# Patient Record
Sex: Male | Born: 1940 | Race: White | Hispanic: No | Marital: Married | Smoking: Never smoker
Health system: Southern US, Community
[De-identification: ages and names within clinical notes are randomized; demographics above are authoritative.]

## PROBLEM LIST (undated history)

## (undated) DIAGNOSIS — M7120 Synovial cyst of popliteal space [Baker], unspecified knee: Secondary | ICD-10-CM

## (undated) DIAGNOSIS — M1120 Other chondrocalcinosis, unspecified site: Secondary | ICD-10-CM

## (undated) DIAGNOSIS — H5702 Anisocoria: Secondary | ICD-10-CM

## (undated) DIAGNOSIS — H409 Unspecified glaucoma: Secondary | ICD-10-CM

## (undated) DIAGNOSIS — I82409 Acute embolism and thrombosis of unspecified deep veins of unspecified lower extremity: Secondary | ICD-10-CM

## (undated) DIAGNOSIS — K635 Polyp of colon: Secondary | ICD-10-CM

## (undated) DIAGNOSIS — E785 Hyperlipidemia, unspecified: Secondary | ICD-10-CM

## (undated) DIAGNOSIS — S0990XA Unspecified injury of head, initial encounter: Secondary | ICD-10-CM

## (undated) DIAGNOSIS — H4901 Third [oculomotor] nerve palsy, right eye: Secondary | ICD-10-CM

## (undated) DIAGNOSIS — F419 Anxiety disorder, unspecified: Secondary | ICD-10-CM

## (undated) DIAGNOSIS — K5790 Diverticulosis of intestine, part unspecified, without perforation or abscess without bleeding: Secondary | ICD-10-CM

## (undated) DIAGNOSIS — S0291XA Unspecified fracture of skull, initial encounter for closed fracture: Secondary | ICD-10-CM

## (undated) DIAGNOSIS — H269 Unspecified cataract: Secondary | ICD-10-CM

## (undated) DIAGNOSIS — I459 Conduction disorder, unspecified: Secondary | ICD-10-CM

## (undated) DIAGNOSIS — R911 Solitary pulmonary nodule: Secondary | ICD-10-CM

## (undated) DIAGNOSIS — I82819 Embolism and thrombosis of superficial veins of unspecified lower extremities: Secondary | ICD-10-CM

## (undated) DIAGNOSIS — E538 Deficiency of other specified B group vitamins: Secondary | ICD-10-CM

## (undated) DIAGNOSIS — N200 Calculus of kidney: Secondary | ICD-10-CM

## (undated) DIAGNOSIS — H02431 Paralytic ptosis of right eyelid: Secondary | ICD-10-CM

## (undated) DIAGNOSIS — C61 Malignant neoplasm of prostate: Secondary | ICD-10-CM

## (undated) DIAGNOSIS — M8580 Other specified disorders of bone density and structure, unspecified site: Secondary | ICD-10-CM

## (undated) DIAGNOSIS — M1710 Unilateral primary osteoarthritis, unspecified knee: Secondary | ICD-10-CM

## (undated) DIAGNOSIS — G40909 Epilepsy, unspecified, not intractable, without status epilepticus: Secondary | ICD-10-CM

## (undated) HISTORY — DX: Epilepsy, unspecified, not intractable, without status epilepticus: G40.909

## (undated) HISTORY — DX: Conduction disorder, unspecified: I45.9

## (undated) HISTORY — DX: Unilateral primary osteoarthritis, unspecified knee: M17.10

## (undated) HISTORY — DX: Third (oculomotor) nerve palsy, right eye: H49.01

## (undated) HISTORY — DX: Unspecified fracture of skull, initial encounter for closed fracture: S02.91XA

## (undated) HISTORY — DX: Polyp of colon: K63.5

## (undated) HISTORY — DX: Hyperlipidemia, unspecified: E78.5

## (undated) HISTORY — PX: COLONOSCOPY W/ POLYPECTOMY: SHX1380

## (undated) HISTORY — DX: Anxiety disorder, unspecified: F41.9

## (undated) HISTORY — DX: Acute embolism and thrombosis of unspecified deep veins of unspecified lower extremity: I82.409

## (undated) HISTORY — DX: Other chondrocalcinosis, unspecified site: M11.20

## (undated) HISTORY — DX: Malignant neoplasm of prostate: C61

## (undated) HISTORY — DX: Anisocoria: H57.02

## (undated) HISTORY — PX: LITHOTRIPSY: SUR834

## (undated) HISTORY — DX: Synovial cyst of popliteal space (Baker), unspecified knee: M71.20

## (undated) HISTORY — DX: Calculus of kidney: N20.0

## (undated) HISTORY — DX: Diverticulosis of intestine, part unspecified, without perforation or abscess without bleeding: K57.90

## (undated) HISTORY — DX: Unspecified cataract: H26.9

## (undated) HISTORY — DX: Solitary pulmonary nodule: R91.1

## (undated) HISTORY — DX: Embolism and thrombosis of superficial veins of unspecified lower extremity: I82.819

## (undated) HISTORY — DX: Paralytic ptosis of right eyelid: H02.431

## (undated) HISTORY — DX: Unspecified glaucoma: H40.9

## (undated) HISTORY — PX: CARPAL TUNNEL RELEASE: SHX101

## (undated) HISTORY — DX: Other specified disorders of bone density and structure, unspecified site: M85.80

## (undated) HISTORY — PX: INCISION AND DRAINAGE: SHX5863

---
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
## 2004-07-15 DIAGNOSIS — S0990XA Unspecified injury of head, initial encounter: Secondary | ICD-10-CM

## 2004-07-15 HISTORY — PX: SUBDURAL HEMATOMA EVACUATION VIA CRANIOTOMY: SUR319

## 2004-07-15 HISTORY — DX: Unspecified injury of head, initial encounter: S09.90XA

## 2006-07-15 HISTORY — PX: ROBOT ASSISTED LAPAROSCOPIC RADICAL PROSTATECTOMY: SHX5141

## 2007-07-16 DIAGNOSIS — M8580 Other specified disorders of bone density and structure, unspecified site: Secondary | ICD-10-CM

## 2007-07-16 HISTORY — DX: Other specified disorders of bone density and structure, unspecified site: M85.80

## 2007-09-02 HISTORY — PX: OTHER SURGICAL HISTORY: SHX169

## 2008-11-12 DIAGNOSIS — K5790 Diverticulosis of intestine, part unspecified, without perforation or abscess without bleeding: Secondary | ICD-10-CM

## 2008-11-12 DIAGNOSIS — K635 Polyp of colon: Secondary | ICD-10-CM

## 2008-11-12 HISTORY — DX: Diverticulosis of intestine, part unspecified, without perforation or abscess without bleeding: K57.90

## 2008-11-12 HISTORY — DX: Polyp of colon: K63.5

## 2011-05-24 DIAGNOSIS — Z9079 Acquired absence of other genital organ(s): Secondary | ICD-10-CM | POA: Insufficient documentation

## 2011-05-24 DIAGNOSIS — N200 Calculus of kidney: Secondary | ICD-10-CM | POA: Insufficient documentation

## 2012-07-15 DIAGNOSIS — M1710 Unilateral primary osteoarthritis, unspecified knee: Secondary | ICD-10-CM

## 2012-07-15 HISTORY — DX: Unilateral primary osteoarthritis, unspecified knee: M17.10

## 2013-07-15 DIAGNOSIS — I82819 Embolism and thrombosis of superficial veins of unspecified lower extremities: Secondary | ICD-10-CM

## 2013-07-15 HISTORY — DX: Embolism and thrombosis of superficial veins of unspecified lower extremity: I82.819

## 2013-09-17 DIAGNOSIS — H903 Sensorineural hearing loss, bilateral: Secondary | ICD-10-CM | POA: Diagnosis not present

## 2013-09-17 DIAGNOSIS — H905 Unspecified sensorineural hearing loss: Secondary | ICD-10-CM | POA: Diagnosis not present

## 2013-10-11 DIAGNOSIS — H02439 Paralytic ptosis unspecified eyelid: Secondary | ICD-10-CM | POA: Diagnosis not present

## 2013-10-11 DIAGNOSIS — H53469 Homonymous bilateral field defects, unspecified side: Secondary | ICD-10-CM | POA: Diagnosis not present

## 2013-10-11 DIAGNOSIS — H40019 Open angle with borderline findings, low risk, unspecified eye: Secondary | ICD-10-CM | POA: Diagnosis not present

## 2013-10-11 DIAGNOSIS — H526 Other disorders of refraction: Secondary | ICD-10-CM | POA: Diagnosis not present

## 2013-10-11 DIAGNOSIS — H4900 Third [oculomotor] nerve palsy, unspecified eye: Secondary | ICD-10-CM | POA: Diagnosis not present

## 2013-10-11 DIAGNOSIS — H2589 Other age-related cataract: Secondary | ICD-10-CM | POA: Diagnosis not present

## 2013-10-13 DIAGNOSIS — N200 Calculus of kidney: Secondary | ICD-10-CM | POA: Diagnosis not present

## 2013-10-13 DIAGNOSIS — Z79899 Other long term (current) drug therapy: Secondary | ICD-10-CM | POA: Diagnosis not present

## 2013-10-13 DIAGNOSIS — Z9889 Other specified postprocedural states: Secondary | ICD-10-CM | POA: Diagnosis not present

## 2013-10-13 DIAGNOSIS — G40909 Epilepsy, unspecified, not intractable, without status epilepticus: Secondary | ICD-10-CM | POA: Diagnosis not present

## 2013-10-13 DIAGNOSIS — Z125 Encounter for screening for malignant neoplasm of prostate: Secondary | ICD-10-CM | POA: Diagnosis not present

## 2013-10-13 DIAGNOSIS — M1990 Unspecified osteoarthritis, unspecified site: Secondary | ICD-10-CM | POA: Diagnosis not present

## 2013-11-16 DIAGNOSIS — D235 Other benign neoplasm of skin of trunk: Secondary | ICD-10-CM | POA: Diagnosis not present

## 2013-11-16 DIAGNOSIS — Z85828 Personal history of other malignant neoplasm of skin: Secondary | ICD-10-CM | POA: Diagnosis not present

## 2013-11-16 DIAGNOSIS — L570 Actinic keratosis: Secondary | ICD-10-CM | POA: Diagnosis not present

## 2013-11-16 DIAGNOSIS — L820 Inflamed seborrheic keratosis: Secondary | ICD-10-CM | POA: Diagnosis not present

## 2013-11-16 DIAGNOSIS — L723 Sebaceous cyst: Secondary | ICD-10-CM | POA: Diagnosis not present

## 2013-11-16 DIAGNOSIS — L819 Disorder of pigmentation, unspecified: Secondary | ICD-10-CM | POA: Diagnosis not present

## 2014-04-14 DIAGNOSIS — R7989 Other specified abnormal findings of blood chemistry: Secondary | ICD-10-CM | POA: Diagnosis not present

## 2014-04-14 DIAGNOSIS — N200 Calculus of kidney: Secondary | ICD-10-CM | POA: Diagnosis not present

## 2014-04-14 DIAGNOSIS — Z79899 Other long term (current) drug therapy: Secondary | ICD-10-CM | POA: Diagnosis not present

## 2014-04-14 DIAGNOSIS — I82812 Embolism and thrombosis of superficial veins of left lower extremities: Secondary | ICD-10-CM | POA: Diagnosis not present

## 2014-04-14 DIAGNOSIS — M7122 Synovial cyst of popliteal space [Baker], left knee: Secondary | ICD-10-CM | POA: Diagnosis not present

## 2014-04-14 DIAGNOSIS — M1990 Unspecified osteoarthritis, unspecified site: Secondary | ICD-10-CM | POA: Diagnosis not present

## 2014-04-14 DIAGNOSIS — K5790 Diverticulosis of intestine, part unspecified, without perforation or abscess without bleeding: Secondary | ICD-10-CM | POA: Diagnosis not present

## 2014-04-14 DIAGNOSIS — G40909 Epilepsy, unspecified, not intractable, without status epilepticus: Secondary | ICD-10-CM | POA: Diagnosis not present

## 2014-04-14 DIAGNOSIS — M7989 Other specified soft tissue disorders: Secondary | ICD-10-CM | POA: Diagnosis not present

## 2014-04-14 DIAGNOSIS — M25572 Pain in left ankle and joints of left foot: Secondary | ICD-10-CM | POA: Diagnosis not present

## 2014-04-14 DIAGNOSIS — Z9889 Other specified postprocedural states: Secondary | ICD-10-CM | POA: Diagnosis not present

## 2014-05-17 DIAGNOSIS — K5790 Diverticulosis of intestine, part unspecified, without perforation or abscess without bleeding: Secondary | ICD-10-CM | POA: Diagnosis not present

## 2014-05-17 DIAGNOSIS — G40909 Epilepsy, unspecified, not intractable, without status epilepticus: Secondary | ICD-10-CM | POA: Diagnosis not present

## 2014-05-17 DIAGNOSIS — N200 Calculus of kidney: Secondary | ICD-10-CM | POA: Diagnosis not present

## 2014-05-17 DIAGNOSIS — Z9889 Other specified postprocedural states: Secondary | ICD-10-CM | POA: Diagnosis not present

## 2014-05-17 DIAGNOSIS — I82402 Acute embolism and thrombosis of unspecified deep veins of left lower extremity: Secondary | ICD-10-CM | POA: Diagnosis not present

## 2014-05-17 DIAGNOSIS — M1990 Unspecified osteoarthritis, unspecified site: Secondary | ICD-10-CM | POA: Diagnosis not present

## 2014-05-31 DIAGNOSIS — L578 Other skin changes due to chronic exposure to nonionizing radiation: Secondary | ICD-10-CM | POA: Diagnosis not present

## 2014-05-31 DIAGNOSIS — L570 Actinic keratosis: Secondary | ICD-10-CM | POA: Diagnosis not present

## 2014-05-31 DIAGNOSIS — D225 Melanocytic nevi of trunk: Secondary | ICD-10-CM | POA: Diagnosis not present

## 2014-05-31 DIAGNOSIS — L821 Other seborrheic keratosis: Secondary | ICD-10-CM | POA: Diagnosis not present

## 2014-05-31 DIAGNOSIS — Z08 Encounter for follow-up examination after completed treatment for malignant neoplasm: Secondary | ICD-10-CM | POA: Diagnosis not present

## 2014-05-31 DIAGNOSIS — L820 Inflamed seborrheic keratosis: Secondary | ICD-10-CM | POA: Diagnosis not present

## 2014-07-20 DIAGNOSIS — I82402 Acute embolism and thrombosis of unspecified deep veins of left lower extremity: Secondary | ICD-10-CM | POA: Diagnosis not present

## 2014-07-20 DIAGNOSIS — G40909 Epilepsy, unspecified, not intractable, without status epilepticus: Secondary | ICD-10-CM | POA: Diagnosis not present

## 2014-07-20 DIAGNOSIS — I82812 Embolism and thrombosis of superficial veins of left lower extremities: Secondary | ICD-10-CM | POA: Diagnosis not present

## 2014-07-20 DIAGNOSIS — Z9889 Other specified postprocedural states: Secondary | ICD-10-CM | POA: Diagnosis not present

## 2014-07-20 DIAGNOSIS — M1990 Unspecified osteoarthritis, unspecified site: Secondary | ICD-10-CM | POA: Diagnosis not present

## 2014-07-20 DIAGNOSIS — K5790 Diverticulosis of intestine, part unspecified, without perforation or abscess without bleeding: Secondary | ICD-10-CM | POA: Diagnosis not present

## 2014-07-20 DIAGNOSIS — N200 Calculus of kidney: Secondary | ICD-10-CM | POA: Diagnosis not present

## 2014-10-14 DIAGNOSIS — R911 Solitary pulmonary nodule: Secondary | ICD-10-CM

## 2014-10-14 DIAGNOSIS — I459 Conduction disorder, unspecified: Secondary | ICD-10-CM

## 2014-10-14 HISTORY — DX: Solitary pulmonary nodule: R91.1

## 2014-10-14 HISTORY — DX: Conduction disorder, unspecified: I45.9

## 2014-10-19 DIAGNOSIS — Z79899 Other long term (current) drug therapy: Secondary | ICD-10-CM | POA: Diagnosis not present

## 2014-10-19 DIAGNOSIS — M1990 Unspecified osteoarthritis, unspecified site: Secondary | ICD-10-CM | POA: Insufficient documentation

## 2014-10-19 DIAGNOSIS — Z125 Encounter for screening for malignant neoplasm of prostate: Secondary | ICD-10-CM | POA: Diagnosis not present

## 2014-10-19 DIAGNOSIS — K5790 Diverticulosis of intestine, part unspecified, without perforation or abscess without bleeding: Secondary | ICD-10-CM | POA: Diagnosis not present

## 2014-10-19 DIAGNOSIS — Z0000 Encounter for general adult medical examination without abnormal findings: Secondary | ICD-10-CM | POA: Diagnosis not present

## 2014-10-19 DIAGNOSIS — G40909 Epilepsy, unspecified, not intractable, without status epilepticus: Secondary | ICD-10-CM | POA: Diagnosis not present

## 2014-10-19 DIAGNOSIS — Z9889 Other specified postprocedural states: Secondary | ICD-10-CM | POA: Diagnosis not present

## 2014-10-19 DIAGNOSIS — M159 Polyosteoarthritis, unspecified: Secondary | ICD-10-CM | POA: Diagnosis not present

## 2014-10-19 DIAGNOSIS — M1000 Idiopathic gout, unspecified site: Secondary | ICD-10-CM | POA: Diagnosis not present

## 2014-10-19 DIAGNOSIS — N200 Calculus of kidney: Secondary | ICD-10-CM | POA: Diagnosis not present

## 2014-11-09 DIAGNOSIS — H2510 Age-related nuclear cataract, unspecified eye: Secondary | ICD-10-CM | POA: Diagnosis not present

## 2014-11-09 DIAGNOSIS — H40019 Open angle with borderline findings, low risk, unspecified eye: Secondary | ICD-10-CM | POA: Diagnosis not present

## 2014-11-09 DIAGNOSIS — H524 Presbyopia: Secondary | ICD-10-CM | POA: Diagnosis not present

## 2014-11-29 DIAGNOSIS — Z85828 Personal history of other malignant neoplasm of skin: Secondary | ICD-10-CM | POA: Diagnosis not present

## 2014-11-29 DIAGNOSIS — C44612 Basal cell carcinoma of skin of right upper limb, including shoulder: Secondary | ICD-10-CM | POA: Diagnosis not present

## 2014-11-29 DIAGNOSIS — L570 Actinic keratosis: Secondary | ICD-10-CM | POA: Diagnosis not present

## 2014-11-29 DIAGNOSIS — L821 Other seborrheic keratosis: Secondary | ICD-10-CM | POA: Diagnosis not present

## 2014-11-29 DIAGNOSIS — C44519 Basal cell carcinoma of skin of other part of trunk: Secondary | ICD-10-CM | POA: Diagnosis not present

## 2015-02-18 DIAGNOSIS — Z79899 Other long term (current) drug therapy: Secondary | ICD-10-CM | POA: Diagnosis not present

## 2015-02-18 DIAGNOSIS — Z91013 Allergy to seafood: Secondary | ICD-10-CM | POA: Diagnosis not present

## 2015-02-18 DIAGNOSIS — Z7982 Long term (current) use of aspirin: Secondary | ICD-10-CM | POA: Diagnosis not present

## 2015-02-18 DIAGNOSIS — M79662 Pain in left lower leg: Secondary | ICD-10-CM | POA: Diagnosis not present

## 2015-02-18 DIAGNOSIS — M79605 Pain in left leg: Secondary | ICD-10-CM | POA: Diagnosis not present

## 2015-02-18 DIAGNOSIS — I82432 Acute embolism and thrombosis of left popliteal vein: Secondary | ICD-10-CM | POA: Diagnosis not present

## 2015-02-18 DIAGNOSIS — Z87820 Personal history of traumatic brain injury: Secondary | ICD-10-CM | POA: Diagnosis not present

## 2015-02-18 DIAGNOSIS — Z880 Allergy status to penicillin: Secondary | ICD-10-CM | POA: Diagnosis not present

## 2015-02-18 DIAGNOSIS — M7989 Other specified soft tissue disorders: Secondary | ICD-10-CM | POA: Diagnosis not present

## 2015-02-21 DIAGNOSIS — C44519 Basal cell carcinoma of skin of other part of trunk: Secondary | ICD-10-CM | POA: Diagnosis not present

## 2015-02-21 DIAGNOSIS — L821 Other seborrheic keratosis: Secondary | ICD-10-CM | POA: Diagnosis not present

## 2015-02-21 DIAGNOSIS — Z08 Encounter for follow-up examination after completed treatment for malignant neoplasm: Secondary | ICD-10-CM | POA: Diagnosis not present

## 2015-02-21 DIAGNOSIS — L570 Actinic keratosis: Secondary | ICD-10-CM | POA: Diagnosis not present

## 2015-02-21 DIAGNOSIS — L218 Other seborrheic dermatitis: Secondary | ICD-10-CM | POA: Diagnosis not present

## 2015-06-19 ENCOUNTER — Encounter: Payer: Self-pay | Admitting: Family Medicine

## 2015-06-19 DIAGNOSIS — M8580 Other specified disorders of bone density and structure, unspecified site: Secondary | ICD-10-CM | POA: Insufficient documentation

## 2015-06-19 DIAGNOSIS — H409 Unspecified glaucoma: Secondary | ICD-10-CM | POA: Insufficient documentation

## 2015-06-19 DIAGNOSIS — F09 Unspecified mental disorder due to known physiological condition: Secondary | ICD-10-CM | POA: Insufficient documentation

## 2015-06-19 DIAGNOSIS — G40909 Epilepsy, unspecified, not intractable, without status epilepticus: Secondary | ICD-10-CM | POA: Insufficient documentation

## 2015-06-22 ENCOUNTER — Ambulatory Visit (INDEPENDENT_AMBULATORY_CARE_PROVIDER_SITE_OTHER): Payer: Medicare Other | Admitting: Family Medicine

## 2015-06-22 ENCOUNTER — Encounter: Payer: Self-pay | Admitting: Family Medicine

## 2015-06-22 VITALS — BP 146/89 | HR 64 | Temp 98.2°F | Resp 16 | Ht 71.0 in | Wt 201.8 lb

## 2015-06-22 DIAGNOSIS — Z86010 Personal history of colonic polyps: Secondary | ICD-10-CM | POA: Diagnosis not present

## 2015-06-22 DIAGNOSIS — Z1283 Encounter for screening for malignant neoplasm of skin: Secondary | ICD-10-CM | POA: Diagnosis not present

## 2015-06-22 DIAGNOSIS — Z23 Encounter for immunization: Secondary | ICD-10-CM

## 2015-06-22 DIAGNOSIS — G40909 Epilepsy, unspecified, not intractable, without status epilepticus: Secondary | ICD-10-CM | POA: Diagnosis not present

## 2015-06-22 NOTE — Addendum Note (Signed)
Addended by: Lanae Crumbly on: 06/22/2015 02:04 PM   Modules accepted: Orders

## 2015-06-22 NOTE — Progress Notes (Signed)
Office Note 06/22/2015  CC:  Chief Complaint  Patient presents with  . Establish Care    HPI:  Adam Melton is a 74 y.o. White male who is here to establish care. Patient's most recent primary MD: Dr. Annamaria Melton in [place]. Old records were reviewed prior to or during today's visit.  No acute complaints.  His most recent CPE with labs was 10/19/2014--CBC, CMET, TSH all normal.  Hemoccult negative.  UA normal.  PSA < 0.1.  Uric acid 6.4 (WNL).  Dilantin level 12.  No seizure activity in a long time (approx 5 yrs).  Not feeling any sign of dilantin toxicity.  He asks for dilantin level check today. Hx of superficial VT L calf, occ notes a bit of odd pressure sensation focally in back of L calf.  No pain or swelling or redness.  He does have some varicose veins on L>R.  Past Medical History  Diagnosis Date  . Osteopenia 2009    By DEXA  . 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  F/u ultrasound was NORMAL.  . Lung nodule 10/2014    calcified granulomata  . Tricompartmental disease of knee 2014    right knee  . Colon polyps 11/2008  . Diverticulosis 11/2008  . Skull fracture ([name])     > 30 years ago; now with seizure d/o  . 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 Nephrolithiasis   . Prostate cancer ([place])     robotic prostectomy   . Anisocoria     R pupil larger than L  . Cataract   . 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 History of prostate cancer   . 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 Robot assisted laparoscopic radical prostatectomy  2008  . Lithotripsy    . Colonoscopy w/ polypectomy  12/02/08    Recall 3 yrs per old records  . Dexa  09/02/07    Osteopenia  . Carpal tunnel release Right     Family History   Problem Relation Age of Onset  . Stroke Mother   . Alcohol abuse Father   . Diabetes Neg Hx   . Heart disease Neg Hx   . Cancer Neg Hx     Social History   Social History  . Marital Status: Married    Spouse Name: Adam Melton  . Number of Children: Adam Melton  . Years of Education: Adam Melton   Occupational History  . Not on file.   Social History Main Topics  . Smoking status: Never Smoker   . Smokeless tobacco: Never Used  . Alcohol Use: No  . Drug Use: No  . Sexual Activity: Not on file   Other Topics Concern  . Not on file   Social History Narrative   Married, has one daughter and 3 grandchildren.   Relocated from [place], [place] 05/2015.   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    Outpatient Encounter Prescriptions as of 06/22/2015  Medication Sig  . aspirin 81 MG EC tablet Take 81 mg by mouth daily.  . busPIRone (BUSPAR) 5 MG tablet Take 5 mg by mouth daily.  . phenytoin (DILANTIN) 100 MG ER capsule Take 200 mg  by mouth 2 (two) times daily.  . travoprost, benzalkonium, (TRAVATAN) 0.004 % ophthalmic solution Place 1 drop into both eyes at bedtime.   No facility-administered encounter medications on file as of 06/22/2015.   Allergies  Allergen Reactions  . Penicillins Nausea And Vomiting and Rash  . Shellfish Allergy Swelling   ROS Review of Systems  Constitutional: Negative for fever and fatigue.  HENT: Negative for congestion and sore throat.   Eyes: Negative for visual disturbance.  Respiratory: Negative for cough.   Cardiovascular: Negative for chest pain.  Gastrointestinal: Negative for nausea and abdominal pain.  Genitourinary: Negative for dysuria.  Musculoskeletal: Negative for back pain and joint swelling.  Skin: Negative for rash.  Neurological: Negative for weakness and headaches.  Hematological: Negative for adenopathy.    PE; Blood pressure 146/89, pulse 64, temperature 98.2 F (36.8 C), temperature source Oral, resp. rate 16, height 5\' 11"  (1.803 m),  weight 201 lb 12 oz (91.513 kg), SpO2 96 %. Gen: Alert, well appearing.  Patient is oriented to person, place, time, and situation. ENT: R ptosis, R pupil large and nonreactive to light and R eye does not move upward (but all other EOM of R eye intact).   Oropharynx: pink/moist mucosa, no lesion or swelling. Tongue midline CV: RRR, no m/r/g.   LUNGS: CTA bilat, nonlabored resps, good aeration in all lung fields. EXT: no c/c/e.  No tenderness of L calf, no palpable knot or cord in calf, no erythema or warmth.  No LL asymmetry.  No popliteal region tenderness.  Homan's neg.  Pertinent labs:  None today  ASSESSMENT AND PLAN:   New pt; reviewed old records.  1) Seizure d/o: will obtain dilantin level today. Last dilantin level was done 10/19/14 and it was 12.0. He has been seizure-free on current dosing for about 5 yrs.  2) Hx of adenomatous colon polyps: he is behind on surveillance colonoscopy and I have ordered referral to local GI for colonoscopy today.  3) Skin cancer screening exam: pt requests referral to dermatologist so he can get full skin screening exam that he says he gets annually.  4) Hx of L calf SVT 2015.  Mild/vague discomfort focally in L calf on intermittent basis since that time.  No sign of SVT or DVT at this time.  Reassured pt. Signs/symptoms to call or return for were reviewed and pt expressed understanding.  An After Visit Summary was printed and given to the patient.  Return in about 5 months (around 11/20/2015) for annual CPE (fasting).

## 2015-06-22 NOTE — Progress Notes (Signed)
Pre visit review using our clinic review tool, if applicable. No additional management support is needed unless otherwise documented below in the visit note. 

## 2015-06-23 LAB — PHENYTOIN LEVEL, TOTAL: Phenytoin Lvl: 12.4 ug/mL (ref 10.0–20.0)

## 2015-06-26 ENCOUNTER — Telehealth: Payer: Self-pay | Admitting: Internal Medicine

## 2015-06-26 ENCOUNTER — Encounter: Payer: Self-pay | Admitting: Family Medicine

## 2015-06-26 NOTE — Telephone Encounter (Signed)
Received GI records and placed on Dr. Blanch Media desk for review. Dr. Henrene Pastor is Doc of the Day.

## 2015-07-06 NOTE — Telephone Encounter (Signed)
Dr. Henrene Pastor reviewed records and has accepted patient. OK to schedule Direct Colonoscopy. Left message for patient to return my call.

## 2015-08-02 ENCOUNTER — Encounter: Payer: Self-pay | Admitting: Internal Medicine

## 2015-08-02 ENCOUNTER — Encounter: Payer: Self-pay | Admitting: Family Medicine

## 2015-08-02 NOTE — Telephone Encounter (Signed)
Colonoscopy scheduled.

## 2015-08-23 DIAGNOSIS — L723 Sebaceous cyst: Secondary | ICD-10-CM | POA: Diagnosis not present

## 2015-08-23 DIAGNOSIS — L919 Hypertrophic disorder of the skin, unspecified: Secondary | ICD-10-CM | POA: Diagnosis not present

## 2015-08-23 DIAGNOSIS — L812 Freckles: Secondary | ICD-10-CM | POA: Diagnosis not present

## 2015-08-23 DIAGNOSIS — L821 Other seborrheic keratosis: Secondary | ICD-10-CM | POA: Diagnosis not present

## 2015-08-23 DIAGNOSIS — L570 Actinic keratosis: Secondary | ICD-10-CM | POA: Diagnosis not present

## 2015-08-23 DIAGNOSIS — Z85828 Personal history of other malignant neoplasm of skin: Secondary | ICD-10-CM | POA: Diagnosis not present

## 2015-09-04 DIAGNOSIS — H4903 Third [oculomotor] nerve palsy, bilateral: Secondary | ICD-10-CM | POA: Diagnosis not present

## 2015-09-04 DIAGNOSIS — H02431 Paralytic ptosis of right eyelid: Secondary | ICD-10-CM | POA: Diagnosis not present

## 2015-09-04 DIAGNOSIS — H25813 Combined forms of age-related cataract, bilateral: Secondary | ICD-10-CM | POA: Diagnosis not present

## 2015-09-04 DIAGNOSIS — H5053 Vertical heterophoria: Secondary | ICD-10-CM | POA: Diagnosis not present

## 2015-09-04 DIAGNOSIS — H401131 Primary open-angle glaucoma, bilateral, mild stage: Secondary | ICD-10-CM | POA: Diagnosis not present

## 2015-09-04 DIAGNOSIS — H53462 Homonymous bilateral field defects, left side: Secondary | ICD-10-CM | POA: Diagnosis not present

## 2015-09-04 DIAGNOSIS — H527 Unspecified disorder of refraction: Secondary | ICD-10-CM | POA: Diagnosis not present

## 2015-09-06 ENCOUNTER — Encounter: Payer: Medicare Other | Admitting: Internal Medicine

## 2015-09-28 ENCOUNTER — Ambulatory Visit (AMBULATORY_SURGERY_CENTER): Payer: Self-pay | Admitting: *Deleted

## 2015-09-28 VITALS — Ht 73.0 in | Wt 206.0 lb

## 2015-09-28 DIAGNOSIS — Z86010 Personal history of colonic polyps: Secondary | ICD-10-CM

## 2015-09-28 NOTE — Progress Notes (Signed)
No egg or soy allergy. No anesthesia problems.  No home O2.  No diet meds.  No emmi given.  

## 2015-10-12 ENCOUNTER — Encounter: Payer: Self-pay | Admitting: Internal Medicine

## 2015-10-12 ENCOUNTER — Ambulatory Visit (AMBULATORY_SURGERY_CENTER): Payer: Medicare Other | Admitting: Internal Medicine

## 2015-10-12 VITALS — BP 123/80 | HR 58 | Temp 97.3°F | Resp 16 | Ht 73.0 in | Wt 206.0 lb

## 2015-10-12 DIAGNOSIS — D123 Benign neoplasm of transverse colon: Secondary | ICD-10-CM

## 2015-10-12 DIAGNOSIS — K5730 Diverticulosis of large intestine without perforation or abscess without bleeding: Secondary | ICD-10-CM | POA: Diagnosis not present

## 2015-10-12 DIAGNOSIS — Z86010 Personal history of colonic polyps: Secondary | ICD-10-CM

## 2015-10-12 DIAGNOSIS — K635 Polyp of colon: Secondary | ICD-10-CM | POA: Diagnosis not present

## 2015-10-12 DIAGNOSIS — D125 Benign neoplasm of sigmoid colon: Secondary | ICD-10-CM | POA: Diagnosis not present

## 2015-10-12 DIAGNOSIS — D120 Benign neoplasm of cecum: Secondary | ICD-10-CM

## 2015-10-12 DIAGNOSIS — G40909 Epilepsy, unspecified, not intractable, without status epilepticus: Secondary | ICD-10-CM | POA: Diagnosis not present

## 2015-10-12 MED ORDER — SODIUM CHLORIDE 0.9 % IV SOLN: 500.0000 mL | INTRAVENOUS | Status: DC

## 2015-10-12 NOTE — Patient Instructions (Signed)
YOU HAD AN ENDOSCOPIC PROCEDURE TODAY AT [facility]:   Refer to the procedure report that was given to you for any specific questions about what was found during the examination.  If the procedure report does not answer your questions, please call your gastroenterologist to clarify.  If you requested that your care partner not be given the details of your procedure findings, then the procedure report has been included in a sealed envelope for you to review at your convenience later.  YOU SHOULD EXPECT: Some feelings of bloating in the abdomen. Passage of more gas than usual.  Walking can help get rid of the air that was put into your GI tract during the procedure and reduce the bloating. If you had a lower endoscopy (such as a colonoscopy or flexible sigmoidoscopy) you may notice spotting of blood in your stool or on the toilet paper. If you underwent a bowel prep for your procedure, you may not have a normal bowel movement for a few days.  Please Note:  You might notice some irritation and congestion in your nose or some drainage.  This is from the oxygen used during your procedure.  There is no need for concern and it should clear up in a day or so.  SYMPTOMS TO REPORT IMMEDIATELY:   Following lower endoscopy (colonoscopy or flexible sigmoidoscopy):  Excessive amounts of blood in the stool  Significant tenderness or worsening of abdominal pains  Swelling of the abdomen that is new, acute  Fever of 100F or higher   For urgent or emergent issues, a gastroenterologist can be reached at any hour by calling [phone].   DIET: Your first meal following the procedure should be a small meal and then it is ok to progress to your normal diet. Heavy or fried foods are harder to digest and may make you feel nauseous or bloated.  Likewise, meals heavy in dairy and vegetables can increase bloating.  Drink plenty of fluids but you should avoid alcoholic beverages for 24  hours.  ACTIVITY:  You should plan to take it easy for the rest of today and you should NOT DRIVE or use heavy machinery until tomorrow (because of the sedation medicines used during the test).    FOLLOW UP: Our staff will call the number listed on your records the next business day following your procedure to check on you and address any questions or concerns that you may have regarding the information given to you following your procedure. If we do not reach you, we will leave a message.  However, if you are feeling well and you are not experiencing any problems, there is no need to return our call.  We will assume that you have returned to your regular daily activities without incident.  If any biopsies were taken you will be contacted by phone or by letter within the next 1-3 weeks.  Please call us at [phone] if you have not heard about the biopsies in 3 weeks.    SIGNATURES/CONFIDENTIALITY: You and/or your care partner have signed paperwork which will be entered into your electronic medical record.  These signatures attest to the fact that that the information above on your After Visit Summary has been reviewed and is understood.  Full responsibility of the confidentiality of this discharge information lies with you and/or your care-partner.  Polyp handout given Await pathology results Resume medication and diet

## 2015-10-12 NOTE — Progress Notes (Signed)
Patient denies any allergies to eggs or soy. 

## 2015-10-12 NOTE — Progress Notes (Signed)
Report given to RN, vss

## 2015-10-12 NOTE — Progress Notes (Signed)
Called to room to assist during endoscopic procedure.  Patient ID and intended procedure confirmed with present staff. Received instructions for my participation in the procedure from the performing physician.  

## 2015-10-12 NOTE — Op Note (Signed)
[facility] Patient Name: Adam Melton Procedure Date: 10/12/2015 9:37 AM MRN: [id number] Endoscopist: Docia Chuck. Henrene Pastor , MD Age: 75 Referring MD:  Date of Birth: 01-24-1941 Gender: Male Procedure:                Colonoscopy with snare polypectomy x 6 Indications:              High risk colon cancer surveillance: Personal                            history of multiple (3 or more) adenomas (Elsewhere                            2010) Medicines:                Monitored Anesthesia Care Procedure:                Pre-Anesthesia Assessment:                           - Prior to the procedure, a History and Physical                            was performed, and patient medications and                            allergies were reviewed. The patient's tolerance of                            previous anesthesia was also reviewed. The risks                            and benefits of the procedure and the sedation                            options and risks were discussed with the patient.                            All questions were answered, and informed consent                            was obtained. Prior Anticoagulants: The patient has                            taken no previous anticoagulant or antiplatelet                            agents. ASA Grade Assessment: II - A patient with                            mild systemic disease. After reviewing the risks                            and benefits, the patient was deemed in  satisfactory condition to undergo the procedure.                           After obtaining informed consent, the colonoscope                            was passed under direct vision. Throughout the                            procedure, the patient's blood pressure, pulse, and                            oxygen saturations were monitored continuously. The                            Model CF-HQ190L [id number]) scope was introduced                           through the anus and advanced to the the cecum,                            identified by appendiceal orifice and ileocecal                            valve. The colonoscopy was performed without                            difficulty. The patient tolerated the procedure                            well. The quality of the bowel preparation was                            excellent. The bowel preparation used was SUPREP.                            The ileocecal valve, appendiceal orifice, and                            rectum were photographed. Scope In: 9:49:34 AM Scope Out: 10:12:02 AM Scope Withdrawal Time: 0 hours 17 minutes 13 seconds  Total Procedure Duration: 0 hours 22 minutes 28 seconds  Findings:      The digital rectal exam was normal.      Six polyps were found in the sigmoid colon, transverse colon and cecum.       The polyps were 4 to 9 mm in size. These polyps were removed with a cold       snare. Resection and retrieval were complete.      Multiple diverticula were found in the sigmoid colon.      The exam was otherwise without abnormality on direct and retroflexion       views. Complications:            No immediate complications. Estimated Blood Loss:     Estimated blood loss: none. Impression:               -  Six 4 to 9 mm polyps in the sigmoid colon, in the                            transverse colon and in the cecum, removed with a                            cold snare. Resected and retrieved.                           - Diverticulosis in the sigmoid colon.                           - The examination was otherwise normal on direct                            and retroflexion views. Recommendation:           - Patient has a contact number available for                            emergencies. The signs and symptoms of potential                            delayed complications were discussed with the                            patient. Return to  normal activities tomorrow.                            Written discharge instructions were provided to the                            patient.                           - Resume previous diet.                           - Continue present medications.                           - Await pathology results.                           - Repeat colonoscopy in 3 years for surveillance. Procedure Code(s):        --- Professional ---                           [phone], Colonoscopy, flexible; with removal of                            tumor(s), polyp(s), or other lesion(s) by snare                            technique CPT copyright 2016 American Medical Association. All rights reserved. Docia Chuck. Henrene Pastor,  MD 10/12/2015 10:22:10 AM This report has been signed electronically. Number of Addenda: 0 Referring MD:      Tammi Sou

## 2015-10-13 ENCOUNTER — Telehealth: Payer: Self-pay | Admitting: *Deleted

## 2015-10-13 NOTE — Telephone Encounter (Signed)
  Follow up Call-  Call back number 10/12/2015  Post procedure Call Back phone  # [phone]  Permission to leave phone message Yes     Patient questions:  Do you have a fever, pain , or abdominal swelling? No. Pain Score  0 *  Have you tolerated food without any problems? Yes.    Have you been able to return to your normal activities? Yes.    Do you have any questions about your discharge instructions: Diet   No. Medications  No. Follow up visit  No.  Do you have questions or concerns about your Care? No.  Actions: * If pain score is 4 or above: No action needed, pain <4.

## 2015-10-17 ENCOUNTER — Encounter: Payer: Self-pay | Admitting: Internal Medicine

## 2015-11-13 DIAGNOSIS — E785 Hyperlipidemia, unspecified: Secondary | ICD-10-CM

## 2015-11-13 HISTORY — DX: Hyperlipidemia, unspecified: E78.5

## 2015-11-16 ENCOUNTER — Encounter: Payer: Self-pay | Admitting: Family Medicine

## 2015-11-16 ENCOUNTER — Ambulatory Visit (INDEPENDENT_AMBULATORY_CARE_PROVIDER_SITE_OTHER): Payer: Medicare Other | Admitting: Family Medicine

## 2015-11-16 VITALS — BP 134/88 | HR 52 | Temp 98.1°F | Resp 16 | Ht 72.0 in | Wt 205.8 lb

## 2015-11-16 DIAGNOSIS — Z0000 Encounter for general adult medical examination without abnormal findings: Secondary | ICD-10-CM | POA: Diagnosis not present

## 2015-11-16 DIAGNOSIS — Z8546 Personal history of malignant neoplasm of prostate: Secondary | ICD-10-CM | POA: Diagnosis not present

## 2015-11-16 DIAGNOSIS — Z79899 Other long term (current) drug therapy: Secondary | ICD-10-CM | POA: Diagnosis not present

## 2015-11-16 DIAGNOSIS — Z23 Encounter for immunization: Secondary | ICD-10-CM

## 2015-11-16 LAB — CBC WITH DIFFERENTIAL/PLATELET
BASOS PCT: 0.6 % (ref 0.0–3.0)
Basophils Absolute: 0 10*3/uL (ref 0.0–0.1)
EOS PCT: 2.7 % (ref 0.0–5.0)
Eosinophils Absolute: 0.2 10*3/uL (ref 0.0–0.7)
HCT: 46.1 % (ref 39.0–52.0)
Hemoglobin: 15.6 g/dL (ref 13.0–17.0)
LYMPHS ABS: 1.1 10*3/uL (ref 0.7–4.0)
Lymphocytes Relative: 17.1 % (ref 12.0–46.0)
MCHC: 34 g/dL (ref 30.0–36.0)
MCV: 99.7 fl (ref 78.0–100.0)
MONO ABS: 0.6 10*3/uL (ref 0.1–1.0)
Monocytes Relative: 9.1 % (ref 3.0–12.0)
NEUTROS ABS: 4.4 10*3/uL (ref 1.4–7.7)
NEUTROS PCT: 70.5 % (ref 43.0–77.0)
PLATELETS: 227 10*3/uL (ref 150.0–400.0)
RBC: 4.62 Mil/uL (ref 4.22–5.81)
RDW: 14 % (ref 11.5–15.5)
WBC: 6.3 10*3/uL (ref 4.0–10.5)

## 2015-11-16 LAB — COMPREHENSIVE METABOLIC PANEL
ALK PHOS: 126 U/L — AB (ref 39–117)
ALT: 10 U/L (ref 0–53)
AST: 17 U/L (ref 0–37)
Albumin: 4.4 g/dL (ref 3.5–5.2)
BUN: 12 mg/dL (ref 6–23)
CO2: 26 mEq/L (ref 19–32)
Calcium: 9.2 mg/dL (ref 8.4–10.5)
Chloride: 105 mEq/L (ref 96–112)
Creatinine, Ser: 0.95 mg/dL (ref 0.40–1.50)
GFR: 82.44 mL/min (ref >60.00)
GLUCOSE: 92 mg/dL (ref 70–99)
POTASSIUM: 4.2 meq/L (ref 3.5–5.1)
SODIUM: 140 meq/L (ref 135–145)
TOTAL PROTEIN: 6.8 g/dL (ref 6.0–8.3)
Total Bilirubin: 0.8 mg/dL (ref 0.2–1.2)

## 2015-11-16 LAB — LIPID PANEL
Cholesterol: 221 mg/dL — ABNORMAL HIGH (ref 0–200)
HDL: 48.5 mg/dL (ref >39.00)
LDL Cholesterol: 148 mg/dL — ABNORMAL HIGH (ref 0–99)
NONHDL: 172.84
Total CHOL/HDL Ratio: 5
Triglycerides: 126 mg/dL (ref 0.0–149.0)
VLDL: 25.2 mg/dL (ref 0.0–40.0)

## 2015-11-16 LAB — PSA: PSA: 0 ng/mL — ABNORMAL LOW (ref 0.10–4.00)

## 2015-11-16 LAB — TSH: TSH: 2.12 u[IU]/mL (ref 0.35–4.50)

## 2015-11-16 NOTE — Patient Instructions (Signed)
At your follow up in 6 mo, do not take your dilantin until AFTER your appointment.

## 2015-11-16 NOTE — Progress Notes (Signed)
Office Note 11/16/2015  CC:  Chief Complaint  Patient presents with  . Annual Exam    Pt is fasting.     HPI:  Adam Melton is a 75 y.o. White male who is here for health maintenance exam. Feeling well. No seizure activity since last visit.  No acute complaints.   Past Medical History  Diagnosis Date  . Osteopenia 2009    By DEXA  . 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  F/u ultrasound was NORMAL.  . Lung nodule 10/2014    calcified granulomata  . Tricompartmental disease of knee 2014    right knee  . Colon polyps 11/2008  . Diverticulosis 11/2008  . Skull fracture ([name])     > 30 years ago; now with seizure d/o  . 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 Nephrolithiasis   . Anisocoria     R pupil larger than L  . Cataract     no surgery  . 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 History of prostate cancer   . 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 Anxiety   . Seizures ([place])     last seizure 10 yr  . Glaucoma   . Prostate cancer [facility])     robotic prostectomy     Past Surgical History  Procedure Laterality Date  . Robot assisted laparoscopic radical prostatectomy  2008  . Lithotripsy    . Colonoscopy w/ polypectomy  12/02/08; 10/12/15    Recall 3 yrs (Dr. Henrene Pastor)  . Dexa  09/02/07    Osteopenia  . Carpal tunnel release Right   . 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 Carpal tunnel release    . Incision and drainage Left     knee    Family History  Problem Relation Age of Onset  . Stroke Mother   . Alcohol abuse Father   . Diabetes Neg Hx   . Heart disease Neg Hx   . Cancer Neg Hx   . Colon cancer Neg Hx     Social History   Social History  . Marital Status: Married    Spouse Name:  N/A  . Number of Children: N/A  . Years of Education: N/A   Occupational History  . Not on file.   Social History Main Topics  . Smoking status: Never Smoker   . Smokeless tobacco: Never Used  . Alcohol Use: No  . Drug Use: No  . Sexual Activity: Not on file   Other Topics Concern  . Not on file   Social History Narrative   Married, has one daughter and 3 grandchildren.   Relocated from [place], [place] 05/2015.   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    Outpatient Prescriptions Prior to Visit  Medication Sig Dispense Refill  . aspirin 81 MG EC tablet Take 81 mg by mouth daily.    . busPIRone (BUSPAR) 5 MG tablet Take 5 mg by mouth daily.    . phenytoin (DILANTIN) 100 MG ER capsule Take 200 mg by mouth 2 (two) times daily.    . timolol (TIMOPTIC) 0.5 % ophthalmic solution 1 drop 2 (two) times daily.    Marland Kitchen  travoprost, benzalkonium, (TRAVATAN) 0.004 % ophthalmic solution Place 1 drop into both eyes at bedtime.     No facility-administered medications prior to visit.    Allergies  Allergen Reactions  . Penicillins Nausea And Vomiting and Rash  . Shellfish Allergy Swelling    ROS Review of Systems  Constitutional: Negative for fever, chills, appetite change and fatigue.  HENT: Negative for congestion, dental problem, ear pain and sore throat.   Eyes: Negative for discharge, redness and visual disturbance.  Respiratory: Negative for cough, chest tightness, shortness of breath and wheezing.   Cardiovascular: Negative for chest pain, palpitations and leg swelling.  Gastrointestinal: Negative for nausea, vomiting, abdominal pain, diarrhea and blood in stool.  Genitourinary: Negative for dysuria, urgency, frequency, hematuria, flank pain and difficulty urinating.  Musculoskeletal: Positive for arthralgias (intermittent R shoulder pain after lifting something heavy recently). Negative for myalgias, back pain, joint swelling and neck stiffness.  Skin: Negative for pallor and  rash.  Neurological: Negative for dizziness, speech difficulty, weakness and headaches.  Hematological: Negative for adenopathy. Does not bruise/bleed easily.  Psychiatric/Behavioral: Negative for confusion and sleep disturbance. The patient is not nervous/anxious.     PE; Blood pressure 134/88, pulse 52, temperature 98.1 F (36.7 C), temperature source Oral, resp. rate 16, height 6' (1.829 m), weight 205 lb 12 oz (93.328 kg), SpO2 95 %. Gen: Alert, well appearing.  Patient is oriented to person, place, time, and situation. AFFECT: pleasant, lucid thought and speech. ENT: Ears: EACs clear, normal epithelium.  TMs with good light reflex and landmarks bilaterally.  Eyes: no injection, icteris, swelling, or exudate.  R pupil approx 4 mm size, L pupil approx 2 mm size (chronic).  R pupil not reactive to light. Nose: no drainage or turbinate edema/swelling.  No injection or focal lesion.  Mouth: lips without lesion/swelling.  Oral mucosa pink and moist.  Dentition intact and without obvious caries or gingival swelling.  Oropharynx without erythema, exudate, or swelling.  Neck: supple/nontender.  No LAD, mass, or TM.  Carotid pulses 2+ bilaterally, without bruits. CV: RRR, no m/r/g.   LUNGS: CTA bilat, nonlabored resps, good aeration in all lung fields. ABD: soft, NT, ND, BS normal.  No hepatospenomegaly or mass.  No bruits. EXT: no clubbing, cyanosis, or edema.  Musculoskeletal: no joint swelling, erythema, warmth, or tenderness.  ROM of all joints intact. Skin - no sores or suspicious lesions or rashes or color changes Rectal: deferred  Pertinent labs:  none  ASSESSMENT AND PLAN:   Health maintenance exam: Reviewed age and gender appropriate health maintenance issues (prudent diet, regular exercise, health risks of tobacco and excessive alcohol, use of seatbelts, fire alarms in home, use of sunscreen).  Also reviewed age and gender appropriate health screening as well as vaccine  recommendations. Prevnar 13 today.  Rx for Tdap given today. HP labs today. Hx of prostate cancer, s/p prostatectomy: check PSA today. He last took dilantin a few hours ago so I can't get a trough level today.  Will do this test at NEXT f/u visit since all previous levels have been good and he has been seizure-free.  An After Visit Summary was printed and given to the patient.  FOLLOW UP:  Return in about 6 months (around 05/18/2016) for routine chronic illness f/u (morning appointment).  Signed:  Crissie Sickles, MD           11/16/2015

## 2015-11-16 NOTE — Progress Notes (Signed)
Pre visit review using our clinic review tool, if applicable. No additional management support is needed unless otherwise documented below in the visit note. 

## 2015-11-16 NOTE — Addendum Note (Signed)
Addended by: Leota Jacobsen on: 11/16/2015 10:53 AM   Modules accepted: Orders

## 2015-11-17 ENCOUNTER — Telehealth: Payer: Self-pay

## 2015-11-17 ENCOUNTER — Encounter: Payer: Self-pay | Admitting: Family Medicine

## 2015-11-17 NOTE — Telephone Encounter (Signed)
-----   Message from Tammi Sou, MD sent at 11/17/2015 12:08 PM EDT ----- All labs normal except cholesterol.  I recommend he start atorvastatin 20mg  once a day.  If pt agreeable pls eRx atorvastatin 20mg , 1 tab po qd, #30, RF x 3.  Lab visit to recheck fasting lipid panel in 2-3 mo, dx is hyperlipidemia.-thx

## 2015-11-17 NOTE — Telephone Encounter (Signed)
Spoke to spouse. Patient not at home.  Gave lab results. Spouse verbalized understanding. She said she will have to discuss with patient if he wants to start Atorvastatin 20mg  or not. Will call back to confirm.

## 2016-01-09 DIAGNOSIS — M9902 Segmental and somatic dysfunction of thoracic region: Secondary | ICD-10-CM | POA: Diagnosis not present

## 2016-01-09 DIAGNOSIS — M25511 Pain in right shoulder: Secondary | ICD-10-CM | POA: Diagnosis not present

## 2016-01-09 DIAGNOSIS — M7542 Impingement syndrome of left shoulder: Secondary | ICD-10-CM | POA: Diagnosis not present

## 2016-01-09 DIAGNOSIS — M50322 Other cervical disc degeneration at C5-C6 level: Secondary | ICD-10-CM | POA: Diagnosis not present

## 2016-01-09 DIAGNOSIS — M25512 Pain in left shoulder: Secondary | ICD-10-CM | POA: Diagnosis not present

## 2016-01-09 DIAGNOSIS — M4003 Postural kyphosis, cervicothoracic region: Secondary | ICD-10-CM | POA: Diagnosis not present

## 2016-01-09 DIAGNOSIS — M9901 Segmental and somatic dysfunction of cervical region: Secondary | ICD-10-CM | POA: Diagnosis not present

## 2016-01-10 DIAGNOSIS — M25511 Pain in right shoulder: Secondary | ICD-10-CM | POA: Diagnosis not present

## 2016-01-10 DIAGNOSIS — M7542 Impingement syndrome of left shoulder: Secondary | ICD-10-CM | POA: Diagnosis not present

## 2016-01-10 DIAGNOSIS — M9902 Segmental and somatic dysfunction of thoracic region: Secondary | ICD-10-CM | POA: Diagnosis not present

## 2016-01-10 DIAGNOSIS — M50322 Other cervical disc degeneration at C5-C6 level: Secondary | ICD-10-CM | POA: Diagnosis not present

## 2016-01-10 DIAGNOSIS — M25512 Pain in left shoulder: Secondary | ICD-10-CM | POA: Diagnosis not present

## 2016-01-10 DIAGNOSIS — M9901 Segmental and somatic dysfunction of cervical region: Secondary | ICD-10-CM | POA: Diagnosis not present

## 2016-01-10 DIAGNOSIS — M4003 Postural kyphosis, cervicothoracic region: Secondary | ICD-10-CM | POA: Diagnosis not present

## 2016-01-11 DIAGNOSIS — M7542 Impingement syndrome of left shoulder: Secondary | ICD-10-CM | POA: Diagnosis not present

## 2016-01-11 DIAGNOSIS — M9901 Segmental and somatic dysfunction of cervical region: Secondary | ICD-10-CM | POA: Diagnosis not present

## 2016-01-11 DIAGNOSIS — M25511 Pain in right shoulder: Secondary | ICD-10-CM | POA: Diagnosis not present

## 2016-01-11 DIAGNOSIS — M50322 Other cervical disc degeneration at C5-C6 level: Secondary | ICD-10-CM | POA: Diagnosis not present

## 2016-01-11 DIAGNOSIS — M9902 Segmental and somatic dysfunction of thoracic region: Secondary | ICD-10-CM | POA: Diagnosis not present

## 2016-01-11 DIAGNOSIS — M4003 Postural kyphosis, cervicothoracic region: Secondary | ICD-10-CM | POA: Diagnosis not present

## 2016-01-11 DIAGNOSIS — M25512 Pain in left shoulder: Secondary | ICD-10-CM | POA: Diagnosis not present

## 2016-01-18 DIAGNOSIS — M50322 Other cervical disc degeneration at C5-C6 level: Secondary | ICD-10-CM | POA: Diagnosis not present

## 2016-01-18 DIAGNOSIS — M9902 Segmental and somatic dysfunction of thoracic region: Secondary | ICD-10-CM | POA: Diagnosis not present

## 2016-01-18 DIAGNOSIS — M9901 Segmental and somatic dysfunction of cervical region: Secondary | ICD-10-CM | POA: Diagnosis not present

## 2016-01-18 DIAGNOSIS — M7542 Impingement syndrome of left shoulder: Secondary | ICD-10-CM | POA: Diagnosis not present

## 2016-01-18 DIAGNOSIS — M25512 Pain in left shoulder: Secondary | ICD-10-CM | POA: Diagnosis not present

## 2016-01-18 DIAGNOSIS — M25511 Pain in right shoulder: Secondary | ICD-10-CM | POA: Diagnosis not present

## 2016-01-18 DIAGNOSIS — M4003 Postural kyphosis, cervicothoracic region: Secondary | ICD-10-CM | POA: Diagnosis not present

## 2016-01-19 DIAGNOSIS — M25511 Pain in right shoulder: Secondary | ICD-10-CM | POA: Diagnosis not present

## 2016-01-19 DIAGNOSIS — M9901 Segmental and somatic dysfunction of cervical region: Secondary | ICD-10-CM | POA: Diagnosis not present

## 2016-01-19 DIAGNOSIS — M4003 Postural kyphosis, cervicothoracic region: Secondary | ICD-10-CM | POA: Diagnosis not present

## 2016-01-19 DIAGNOSIS — M7542 Impingement syndrome of left shoulder: Secondary | ICD-10-CM | POA: Diagnosis not present

## 2016-01-19 DIAGNOSIS — M25512 Pain in left shoulder: Secondary | ICD-10-CM | POA: Diagnosis not present

## 2016-01-19 DIAGNOSIS — M9902 Segmental and somatic dysfunction of thoracic region: Secondary | ICD-10-CM | POA: Diagnosis not present

## 2016-01-19 DIAGNOSIS — M50322 Other cervical disc degeneration at C5-C6 level: Secondary | ICD-10-CM | POA: Diagnosis not present

## 2016-01-24 DIAGNOSIS — M4003 Postural kyphosis, cervicothoracic region: Secondary | ICD-10-CM | POA: Diagnosis not present

## 2016-01-24 DIAGNOSIS — M7542 Impingement syndrome of left shoulder: Secondary | ICD-10-CM | POA: Diagnosis not present

## 2016-01-24 DIAGNOSIS — M25511 Pain in right shoulder: Secondary | ICD-10-CM | POA: Diagnosis not present

## 2016-01-24 DIAGNOSIS — M9902 Segmental and somatic dysfunction of thoracic region: Secondary | ICD-10-CM | POA: Diagnosis not present

## 2016-01-24 DIAGNOSIS — M50322 Other cervical disc degeneration at C5-C6 level: Secondary | ICD-10-CM | POA: Diagnosis not present

## 2016-01-24 DIAGNOSIS — M9901 Segmental and somatic dysfunction of cervical region: Secondary | ICD-10-CM | POA: Diagnosis not present

## 2016-01-24 DIAGNOSIS — M25512 Pain in left shoulder: Secondary | ICD-10-CM | POA: Diagnosis not present

## 2016-01-25 DIAGNOSIS — M25512 Pain in left shoulder: Secondary | ICD-10-CM | POA: Diagnosis not present

## 2016-01-25 DIAGNOSIS — M9901 Segmental and somatic dysfunction of cervical region: Secondary | ICD-10-CM | POA: Diagnosis not present

## 2016-01-25 DIAGNOSIS — M9902 Segmental and somatic dysfunction of thoracic region: Secondary | ICD-10-CM | POA: Diagnosis not present

## 2016-01-25 DIAGNOSIS — M4003 Postural kyphosis, cervicothoracic region: Secondary | ICD-10-CM | POA: Diagnosis not present

## 2016-01-25 DIAGNOSIS — M7542 Impingement syndrome of left shoulder: Secondary | ICD-10-CM | POA: Diagnosis not present

## 2016-01-25 DIAGNOSIS — M25511 Pain in right shoulder: Secondary | ICD-10-CM | POA: Diagnosis not present

## 2016-01-25 DIAGNOSIS — M50322 Other cervical disc degeneration at C5-C6 level: Secondary | ICD-10-CM | POA: Diagnosis not present

## 2016-01-30 DIAGNOSIS — M9901 Segmental and somatic dysfunction of cervical region: Secondary | ICD-10-CM | POA: Diagnosis not present

## 2016-01-30 DIAGNOSIS — M9902 Segmental and somatic dysfunction of thoracic region: Secondary | ICD-10-CM | POA: Diagnosis not present

## 2016-01-30 DIAGNOSIS — M50322 Other cervical disc degeneration at C5-C6 level: Secondary | ICD-10-CM | POA: Diagnosis not present

## 2016-01-30 DIAGNOSIS — M4003 Postural kyphosis, cervicothoracic region: Secondary | ICD-10-CM | POA: Diagnosis not present

## 2016-01-30 DIAGNOSIS — M25511 Pain in right shoulder: Secondary | ICD-10-CM | POA: Diagnosis not present

## 2016-01-30 DIAGNOSIS — M25512 Pain in left shoulder: Secondary | ICD-10-CM | POA: Diagnosis not present

## 2016-01-30 DIAGNOSIS — M7542 Impingement syndrome of left shoulder: Secondary | ICD-10-CM | POA: Diagnosis not present

## 2016-02-01 DIAGNOSIS — M7542 Impingement syndrome of left shoulder: Secondary | ICD-10-CM | POA: Diagnosis not present

## 2016-02-01 DIAGNOSIS — M50322 Other cervical disc degeneration at C5-C6 level: Secondary | ICD-10-CM | POA: Diagnosis not present

## 2016-02-01 DIAGNOSIS — M9901 Segmental and somatic dysfunction of cervical region: Secondary | ICD-10-CM | POA: Diagnosis not present

## 2016-02-01 DIAGNOSIS — M9902 Segmental and somatic dysfunction of thoracic region: Secondary | ICD-10-CM | POA: Diagnosis not present

## 2016-02-01 DIAGNOSIS — M25511 Pain in right shoulder: Secondary | ICD-10-CM | POA: Diagnosis not present

## 2016-02-01 DIAGNOSIS — M4003 Postural kyphosis, cervicothoracic region: Secondary | ICD-10-CM | POA: Diagnosis not present

## 2016-02-01 DIAGNOSIS — M25512 Pain in left shoulder: Secondary | ICD-10-CM | POA: Diagnosis not present

## 2016-02-06 DIAGNOSIS — M50322 Other cervical disc degeneration at C5-C6 level: Secondary | ICD-10-CM | POA: Diagnosis not present

## 2016-02-06 DIAGNOSIS — M7542 Impingement syndrome of left shoulder: Secondary | ICD-10-CM | POA: Diagnosis not present

## 2016-02-06 DIAGNOSIS — M9901 Segmental and somatic dysfunction of cervical region: Secondary | ICD-10-CM | POA: Diagnosis not present

## 2016-02-06 DIAGNOSIS — M4003 Postural kyphosis, cervicothoracic region: Secondary | ICD-10-CM | POA: Diagnosis not present

## 2016-02-06 DIAGNOSIS — M25511 Pain in right shoulder: Secondary | ICD-10-CM | POA: Diagnosis not present

## 2016-02-06 DIAGNOSIS — M25512 Pain in left shoulder: Secondary | ICD-10-CM | POA: Diagnosis not present

## 2016-02-06 DIAGNOSIS — M9902 Segmental and somatic dysfunction of thoracic region: Secondary | ICD-10-CM | POA: Diagnosis not present

## 2016-02-08 DIAGNOSIS — M4003 Postural kyphosis, cervicothoracic region: Secondary | ICD-10-CM | POA: Diagnosis not present

## 2016-02-08 DIAGNOSIS — M50322 Other cervical disc degeneration at C5-C6 level: Secondary | ICD-10-CM | POA: Diagnosis not present

## 2016-02-08 DIAGNOSIS — M25512 Pain in left shoulder: Secondary | ICD-10-CM | POA: Diagnosis not present

## 2016-02-08 DIAGNOSIS — M9901 Segmental and somatic dysfunction of cervical region: Secondary | ICD-10-CM | POA: Diagnosis not present

## 2016-02-08 DIAGNOSIS — M7542 Impingement syndrome of left shoulder: Secondary | ICD-10-CM | POA: Diagnosis not present

## 2016-02-08 DIAGNOSIS — M9902 Segmental and somatic dysfunction of thoracic region: Secondary | ICD-10-CM | POA: Diagnosis not present

## 2016-02-08 DIAGNOSIS — M25511 Pain in right shoulder: Secondary | ICD-10-CM | POA: Diagnosis not present

## 2016-02-13 DIAGNOSIS — M4003 Postural kyphosis, cervicothoracic region: Secondary | ICD-10-CM | POA: Diagnosis not present

## 2016-02-13 DIAGNOSIS — M9901 Segmental and somatic dysfunction of cervical region: Secondary | ICD-10-CM | POA: Diagnosis not present

## 2016-02-13 DIAGNOSIS — M50322 Other cervical disc degeneration at C5-C6 level: Secondary | ICD-10-CM | POA: Diagnosis not present

## 2016-02-13 DIAGNOSIS — M25512 Pain in left shoulder: Secondary | ICD-10-CM | POA: Diagnosis not present

## 2016-02-13 DIAGNOSIS — M25511 Pain in right shoulder: Secondary | ICD-10-CM | POA: Diagnosis not present

## 2016-02-13 DIAGNOSIS — M9902 Segmental and somatic dysfunction of thoracic region: Secondary | ICD-10-CM | POA: Diagnosis not present

## 2016-02-13 DIAGNOSIS — M7542 Impingement syndrome of left shoulder: Secondary | ICD-10-CM | POA: Diagnosis not present

## 2016-02-14 DIAGNOSIS — M50322 Other cervical disc degeneration at C5-C6 level: Secondary | ICD-10-CM | POA: Diagnosis not present

## 2016-02-14 DIAGNOSIS — M7542 Impingement syndrome of left shoulder: Secondary | ICD-10-CM | POA: Diagnosis not present

## 2016-02-14 DIAGNOSIS — M25511 Pain in right shoulder: Secondary | ICD-10-CM | POA: Diagnosis not present

## 2016-02-14 DIAGNOSIS — M9902 Segmental and somatic dysfunction of thoracic region: Secondary | ICD-10-CM | POA: Diagnosis not present

## 2016-02-14 DIAGNOSIS — M4003 Postural kyphosis, cervicothoracic region: Secondary | ICD-10-CM | POA: Diagnosis not present

## 2016-02-14 DIAGNOSIS — M25512 Pain in left shoulder: Secondary | ICD-10-CM | POA: Diagnosis not present

## 2016-02-14 DIAGNOSIS — M9901 Segmental and somatic dysfunction of cervical region: Secondary | ICD-10-CM | POA: Diagnosis not present

## 2016-02-21 DIAGNOSIS — L814 Other melanin hyperpigmentation: Secondary | ICD-10-CM | POA: Diagnosis not present

## 2016-02-21 DIAGNOSIS — D225 Melanocytic nevi of trunk: Secondary | ICD-10-CM | POA: Diagnosis not present

## 2016-02-21 DIAGNOSIS — L821 Other seborrheic keratosis: Secondary | ICD-10-CM | POA: Diagnosis not present

## 2016-02-21 DIAGNOSIS — M9901 Segmental and somatic dysfunction of cervical region: Secondary | ICD-10-CM | POA: Diagnosis not present

## 2016-02-21 DIAGNOSIS — M9902 Segmental and somatic dysfunction of thoracic region: Secondary | ICD-10-CM | POA: Diagnosis not present

## 2016-02-21 DIAGNOSIS — M50322 Other cervical disc degeneration at C5-C6 level: Secondary | ICD-10-CM | POA: Diagnosis not present

## 2016-02-21 DIAGNOSIS — M4003 Postural kyphosis, cervicothoracic region: Secondary | ICD-10-CM | POA: Diagnosis not present

## 2016-02-21 DIAGNOSIS — M25511 Pain in right shoulder: Secondary | ICD-10-CM | POA: Diagnosis not present

## 2016-02-21 DIAGNOSIS — M25512 Pain in left shoulder: Secondary | ICD-10-CM | POA: Diagnosis not present

## 2016-02-21 DIAGNOSIS — L570 Actinic keratosis: Secondary | ICD-10-CM | POA: Diagnosis not present

## 2016-02-21 DIAGNOSIS — M7542 Impingement syndrome of left shoulder: Secondary | ICD-10-CM | POA: Diagnosis not present

## 2016-02-27 DIAGNOSIS — M25512 Pain in left shoulder: Secondary | ICD-10-CM | POA: Diagnosis not present

## 2016-02-27 DIAGNOSIS — M9902 Segmental and somatic dysfunction of thoracic region: Secondary | ICD-10-CM | POA: Diagnosis not present

## 2016-02-27 DIAGNOSIS — M50322 Other cervical disc degeneration at C5-C6 level: Secondary | ICD-10-CM | POA: Diagnosis not present

## 2016-02-27 DIAGNOSIS — M7542 Impingement syndrome of left shoulder: Secondary | ICD-10-CM | POA: Diagnosis not present

## 2016-02-27 DIAGNOSIS — M4003 Postural kyphosis, cervicothoracic region: Secondary | ICD-10-CM | POA: Diagnosis not present

## 2016-02-27 DIAGNOSIS — M25511 Pain in right shoulder: Secondary | ICD-10-CM | POA: Diagnosis not present

## 2016-02-27 DIAGNOSIS — M9901 Segmental and somatic dysfunction of cervical region: Secondary | ICD-10-CM | POA: Diagnosis not present

## 2016-02-28 DIAGNOSIS — M50322 Other cervical disc degeneration at C5-C6 level: Secondary | ICD-10-CM | POA: Diagnosis not present

## 2016-02-28 DIAGNOSIS — M7542 Impingement syndrome of left shoulder: Secondary | ICD-10-CM | POA: Diagnosis not present

## 2016-02-28 DIAGNOSIS — M25511 Pain in right shoulder: Secondary | ICD-10-CM | POA: Diagnosis not present

## 2016-02-28 DIAGNOSIS — M9902 Segmental and somatic dysfunction of thoracic region: Secondary | ICD-10-CM | POA: Diagnosis not present

## 2016-02-28 DIAGNOSIS — M9901 Segmental and somatic dysfunction of cervical region: Secondary | ICD-10-CM | POA: Diagnosis not present

## 2016-02-28 DIAGNOSIS — M25512 Pain in left shoulder: Secondary | ICD-10-CM | POA: Diagnosis not present

## 2016-02-28 DIAGNOSIS — M4003 Postural kyphosis, cervicothoracic region: Secondary | ICD-10-CM | POA: Diagnosis not present

## 2016-03-06 DIAGNOSIS — H02431 Paralytic ptosis of right eyelid: Secondary | ICD-10-CM | POA: Diagnosis not present

## 2016-03-06 DIAGNOSIS — H4901 Third [oculomotor] nerve palsy, right eye: Secondary | ICD-10-CM | POA: Diagnosis not present

## 2016-03-06 DIAGNOSIS — H401131 Primary open-angle glaucoma, bilateral, mild stage: Secondary | ICD-10-CM | POA: Diagnosis not present

## 2016-03-06 DIAGNOSIS — H5053 Vertical heterophoria: Secondary | ICD-10-CM | POA: Diagnosis not present

## 2016-03-13 DIAGNOSIS — M25511 Pain in right shoulder: Secondary | ICD-10-CM | POA: Diagnosis not present

## 2016-03-13 DIAGNOSIS — M25512 Pain in left shoulder: Secondary | ICD-10-CM | POA: Diagnosis not present

## 2016-03-13 DIAGNOSIS — M4003 Postural kyphosis, cervicothoracic region: Secondary | ICD-10-CM | POA: Diagnosis not present

## 2016-03-13 DIAGNOSIS — M50322 Other cervical disc degeneration at C5-C6 level: Secondary | ICD-10-CM | POA: Diagnosis not present

## 2016-03-13 DIAGNOSIS — M9902 Segmental and somatic dysfunction of thoracic region: Secondary | ICD-10-CM | POA: Diagnosis not present

## 2016-03-13 DIAGNOSIS — M9901 Segmental and somatic dysfunction of cervical region: Secondary | ICD-10-CM | POA: Diagnosis not present

## 2016-03-13 DIAGNOSIS — M7542 Impingement syndrome of left shoulder: Secondary | ICD-10-CM | POA: Diagnosis not present

## 2016-03-15 DIAGNOSIS — M1120 Other chondrocalcinosis, unspecified site: Secondary | ICD-10-CM

## 2016-03-15 DIAGNOSIS — M7120 Synovial cyst of popliteal space [Baker], unspecified knee: Secondary | ICD-10-CM

## 2016-03-15 HISTORY — DX: Synovial cyst of popliteal space (Baker), unspecified knee: M71.20

## 2016-03-15 HISTORY — DX: Other chondrocalcinosis, unspecified site: M11.20

## 2016-03-19 ENCOUNTER — Ambulatory Visit (HOSPITAL_BASED_OUTPATIENT_CLINIC_OR_DEPARTMENT_OTHER)
Admission: RE | Admit: 2016-03-19 | Discharge: 2016-03-19 | Disposition: A | Discharge status: Home / Self Care | Payer: Medicare Other | Source: Ambulatory Visit | Attending: Family Medicine | Admitting: Family Medicine

## 2016-03-19 ENCOUNTER — Encounter: Payer: Self-pay | Admitting: Family Medicine

## 2016-03-19 ENCOUNTER — Other Ambulatory Visit: Payer: Self-pay | Admitting: Family Medicine

## 2016-03-19 ENCOUNTER — Ambulatory Visit (INDEPENDENT_AMBULATORY_CARE_PROVIDER_SITE_OTHER): Payer: Medicare Other | Admitting: Family Medicine

## 2016-03-19 ENCOUNTER — Telehealth: Payer: Self-pay

## 2016-03-19 VITALS — BP 139/83 | HR 63 | Temp 98.5°F | Resp 16 | Ht 71.0 in | Wt 193.0 lb

## 2016-03-19 DIAGNOSIS — Z86718 Personal history of other venous thrombosis and embolism: Secondary | ICD-10-CM | POA: Diagnosis not present

## 2016-03-19 DIAGNOSIS — M11261 Other chondrocalcinosis, right knee: Secondary | ICD-10-CM | POA: Diagnosis not present

## 2016-03-19 DIAGNOSIS — M25461 Effusion, right knee: Secondary | ICD-10-CM | POA: Insufficient documentation

## 2016-03-19 DIAGNOSIS — I82491 Acute embolism and thrombosis of other specified deep vein of right lower extremity: Secondary | ICD-10-CM | POA: Diagnosis not present

## 2016-03-19 DIAGNOSIS — M1711 Unilateral primary osteoarthritis, right knee: Secondary | ICD-10-CM | POA: Diagnosis not present

## 2016-03-19 DIAGNOSIS — I70201 Unspecified atherosclerosis of native arteries of extremities, right leg: Secondary | ICD-10-CM | POA: Diagnosis not present

## 2016-03-19 DIAGNOSIS — I82409 Acute embolism and thrombosis of unspecified deep veins of unspecified lower extremity: Secondary | ICD-10-CM

## 2016-03-19 DIAGNOSIS — M7989 Other specified soft tissue disorders: Secondary | ICD-10-CM | POA: Diagnosis not present

## 2016-03-19 DIAGNOSIS — M7121 Synovial cyst of popliteal space [Baker], right knee: Secondary | ICD-10-CM | POA: Insufficient documentation

## 2016-03-19 DIAGNOSIS — I82811 Embolism and thrombosis of superficial veins of right lower extremities: Secondary | ICD-10-CM | POA: Insufficient documentation

## 2016-03-19 HISTORY — DX: Acute embolism and thrombosis of unspecified deep veins of unspecified lower extremity: I82.409

## 2016-03-19 MED ORDER — RIVAROXABAN 20 MG PO TABS: 20.0000 mg | ORAL_TABLET | Freq: Every day | ORAL | 3 refills | Status: DC

## 2016-03-19 MED ORDER — RIVAROXABAN 15 MG PO TABS: ORAL_TABLET | ORAL | 0 refills | Status: DC

## 2016-03-19 MED ORDER — RIVAROXABAN (XARELTO) VTE STARTER PACK (15 & 20 MG): ORAL_TABLET | ORAL | 0 refills | Status: DC

## 2016-03-19 NOTE — Progress Notes (Signed)
OFFICE VISIT  03/19/2016   CC:  Chief Complaint  Patient presents with  . Leg Swelling    right knee 2 days     HPI:    Patient is a 75 y.o. Caucasian male who presents for swelling of right knee.   Noted swelling in proximal/lateral aspect of R knee yesterday.  No pain at all.  Size approx same, perhaps slightly smaller today. He had been bending knees/crouching some prior but nothing too repetitive.   No difficulty walking at all. No fevers or malaise. Reports remote hx of Baker's cyst in R knee (>20 yrs ago?). He also has hx of thrombosis of left saphenous vein for which he took 3 mo of xarelto in the past.  Past Medical History:  Diagnosis Date  . Anisocoria    R pupil larger than L  . Anxiety   . Cataract    no surgery  . Colon polyps 11/2008  . Diverticulosis 11/2008  . Glaucoma   . History of prostate cancer   . Hyperlipidemia 11/2015  . 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 Lung nodule 10/2014   calcified granulomata  . Nephrolithiasis   . Osteopenia 2009   By DEXA  . 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 Seizures ([place])    last seizure 10 yr  . Skull fracture ([place])    > 30 years ago; now with seizure d/o  . 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 Thrombosis of saphenous vein 2015   Left; PCP in [place] put him on xarelto x 3 mo.  F/u ultrasound was NORMAL.  . 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 CARPAL TUNNEL RELEASE Right   . CARPAL TUNNEL RELEASE    . COLONOSCOPY W/ POLYPECTOMY  12/02/08; 10/12/15   Recall 3 yrs (Dr. Henrene Pastor)  . DEXA  09/02/07   Osteopenia  . INCISION AND DRAINAGE Left    knee  . LITHOTRIPSY    . ROBOT ASSISTED LAPAROSCOPIC RADICAL PROSTATECTOMY  2008  . SUBDURAL  HEMATOMA EVACUATION VIA CRANIOTOMY     + temporal intracerebral hematoma--surgical drainage    Outpatient Medications Prior to Visit  Medication Sig Dispense Refill  . aspirin 81 MG EC tablet Take 81 mg by mouth daily.    . busPIRone (BUSPAR) 5 MG tablet Take 5 mg by mouth daily.    . phenytoin (DILANTIN) 100 MG ER capsule Take 200 mg by mouth 2 (two) times daily.    . timolol (TIMOPTIC) 0.5 % ophthalmic solution 1 drop 2 (two) times daily.    . travoprost, benzalkonium, (TRAVATAN) 0.004 % ophthalmic solution Place 1 drop into both eyes at bedtime.     No facility-administered medications prior to visit.     Allergies  Allergen Reactions  . Penicillins Nausea And Vomiting and Rash  . Shellfish Allergy Swelling    ROS As per HPI  PE: Blood pressure 139/83, pulse 63, temperature 98.5 F (36.9 C), temperature source Oral, resp. rate 16, height 5\' 11"  (1.803 m), weight 193 lb (87.5 kg), SpO2 96 %. Gen: Alert, well appearing.  Patient is oriented to person, place, time, and situation. AFFECT: pleasant, lucid thought and speech. R knee diffusely  swollen and mildly warm but no erythema or tenderness. Notable STS superolateral to the patella--in the distal aspect of vastus lateralis muscle.  Some soft tissue fullness but no tenderness or mass palpable in R popliteal fossa.  He can flex R knee nearly completely, without pain.  Left knee flexes more completely.  Knee extension is full bilat. LL's: some scattered non-inflamed varicosities, with trace bilat LE pitting edema.  No assymetry.  LABS:    Chemistry      Component Value Date/Time   NA 140 11/16/2015 1047   K 4.2 11/16/2015 1047   CL 105 11/16/2015 1047   CO2 26 11/16/2015 1047   BUN 12 11/16/2015 1047   CREATININE 0.95 11/16/2015 1047      Component Value Date/Time   CALCIUM 9.2 11/16/2015 1047   ALKPHOS 126 (H) 11/16/2015 1047   AST 17 11/16/2015 1047   ALT 10 11/16/2015 1047   BILITOT 0.8 11/16/2015 1047        IMPRESSION AND PLAN:  1) Painless, spontaneous R knee swelling. It appears he has an effusion plus an area of soft tissue swelling superolateral to the patella (? Fluid displacing soft tissue in superolateral aspect of knee?)--no tenderness. He also has some fullness in R popliteal fossa. With his history of left leg DVT in the past, I want to get venous doppler u/s today to r/o DVT. Additionally, I will get a plain film of R knee today. I recommended he take 600 mg ibuprofen bid x 10d, plus ice knee x 20 min 1-2 times a day x 10d. I'll see him back in 10d.  If all studies ordered today are unrevealing then will do R knee aspiration for fluid analysis.  An After Visit Summary was printed and given to the patient.  FOLLOW UP: Return in about 10 days (around 03/29/2016) for f/u R knee swelling (30 min, possible procedure).  Signed:  Crissie Sickles, MD           03/19/2016

## 2016-03-19 NOTE — Telephone Encounter (Signed)
Patient wife  called in stating that he is taking 15 mg xerelto

## 2016-03-19 NOTE — Patient Instructions (Signed)
Take 3 otc ibuprofen tabs twice daily with food for 10d. Apply ice to your knee for 20 min, 1-2 times per day for 10d.

## 2016-03-20 ENCOUNTER — Telehealth: Payer: Self-pay | Admitting: Family Medicine

## 2016-03-20 NOTE — Telephone Encounter (Signed)
Yes, start 15mg  dosing with one dose tonight. Starting tomorrow he'll take 15mg  twice per day until his 21 day supply runs out.  After that runs out he'll start 20mg  once a day dosing.-thx

## 2016-03-20 NOTE — Telephone Encounter (Signed)
Per dr. Anitra Lauth, Adam Melton is to go ahead and take 15 mg xarelto tonight and then just begin 15 mg BID tomorrow x 21 days.  Patient aware and voiced understanding.

## 2016-03-20 NOTE — Telephone Encounter (Signed)
Pt's insurance rejected xarelto, but when I called them to do PA it was approved right away.  Pt came into office today and stated that he had samples of xarelto 20 mg from his past physician.  Pt did take one yesterday and one this morning since insurance wouldn't cover xarelto at that time.  Should pt take 15mg  tab tonight?  Please advise.

## 2016-03-29 ENCOUNTER — Encounter: Payer: Self-pay | Admitting: Family Medicine

## 2016-03-29 ENCOUNTER — Ambulatory Visit (INDEPENDENT_AMBULATORY_CARE_PROVIDER_SITE_OTHER): Payer: Medicare Other | Admitting: Family Medicine

## 2016-03-29 VITALS — BP 127/81 | HR 54 | Temp 97.8°F | Resp 15 | Wt 201.0 lb

## 2016-03-29 DIAGNOSIS — M7121 Synovial cyst of popliteal space [Baker], right knee: Secondary | ICD-10-CM | POA: Diagnosis not present

## 2016-03-29 DIAGNOSIS — I824Z1 Acute embolism and thrombosis of unspecified deep veins of right distal lower extremity: Secondary | ICD-10-CM

## 2016-03-29 NOTE — Progress Notes (Signed)
Pre visit review using our clinic review tool, if applicable. No additional management support is needed unless otherwise documented below in the visit note. 

## 2016-03-29 NOTE — Progress Notes (Signed)
OFFICE VISIT  03/29/2016   CC:  Chief Complaint  Patient presents with  . Follow-up    Right knee swelling     HPI:    Patient is a 75 y.o. Caucasian male who presents for 10 day f/u acute R LL DVT with R knee Bakers cyst. He started xarelto and has no side effects, no bleeding noted. Feels like R knee may be a little less swollen.  No new complaints.  Past Medical History:  Diagnosis Date  . Anisocoria    R pupil larger than L  . Anxiety   . Cataract    no surgery  . Colon polyps 11/2008 & 2017  . Diverticulosis 11/2008  . DVT of lower limb, acute ([place]) 03/19/2016   Right gastroc (xarelto started)  . Glaucoma   . History of prostate cancer   . Hyperlipidemia 11/2015  . 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 Lung nodule 10/2014   calcified granulomata  . Nephrolithiasis   . Osteopenia 2009   By DEXA  . 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 Seizures ([place])    last seizure 10 yr  . Skull fracture ([place])    > 30 years ago; now with seizure d/o  . 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 Thrombosis of saphenous vein 2015   Left; PCP in [place] put him on xarelto x 3 mo.  F/u ultrasound was NORMAL.  . 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 CARPAL TUNNEL RELEASE Right   . CARPAL TUNNEL RELEASE    . COLONOSCOPY W/ POLYPECTOMY  12/02/08; 10/12/15   Recall 3 yrs (Dr. Henrene Pastor)  . DEXA  09/02/07   Osteopenia  . INCISION AND DRAINAGE Left    knee  . LITHOTRIPSY    . ROBOT ASSISTED LAPAROSCOPIC RADICAL PROSTATECTOMY  2008  . SUBDURAL HEMATOMA EVACUATION VIA CRANIOTOMY     + temporal intracerebral hematoma--surgical drainage    Outpatient Medications Prior to Visit  Medication Sig Dispense Refill  . aspirin  81 MG EC tablet Take 81 mg by mouth daily.    . busPIRone (BUSPAR) 5 MG tablet Take 5 mg by mouth daily.    . phenytoin (DILANTIN) 100 MG ER capsule Take 200 mg by mouth 2 (two) times daily.    . Rivaroxaban (XARELTO) 15 MG TABS tablet 1 tab po bid with meals x 21 days 42 tablet 0  . rivaroxaban (XARELTO) 20 MG TABS tablet Take 1 tablet (20 mg total) by mouth daily with supper. 30 tablet 3  . timolol (TIMOPTIC) 0.5 % ophthalmic solution 1 drop 2 (two) times daily.    . travoprost, benzalkonium, (TRAVATAN) 0.004 % ophthalmic solution Place 1 drop into both eyes at bedtime.     No facility-administered medications prior to visit.     Allergies  Allergen Reactions  . Penicillins Nausea And Vomiting and Rash  . Shellfish Allergy Swelling    ROS As per HPI  PE: Blood pressure 127/81, pulse (!) 54, temperature 97.8 F (36.6 C), temperature source Oral, resp. rate 15, weight 201 lb (91.2 kg), SpO2 96 %. Gen: Alert, well appearing.  Patient is oriented to person, place, time, and situation. CV: RRR,  no m/r/g.   LUNGS: CTA bilat, nonlabored resps, good aeration in all lung fields. R knee with minimal warmth compared to left knee. Moderate knee effusion is apparent on observation.  No palpable knee tenderness.  ROM intact. He has soft tissue fullness palpable in medial aspect of popliteal fossa of right knee, more evident with knee extension, goes away with knee flexed to 90 deg.   LL's without pitting edema.  No calf tenderness.  LABS:  None today  Imaging 03/19/16:  DG knee complete 4 views Right:   IMPRESSION: 1. Large knee joint effusion. 2. Mild tricompartmental degenerative osteoarthritis. 3. Moderate medial and lateral compartmental chondrocalcinosis. 4. Density in the popliteal fossa consistent with a probable Baker cyst. 5. Atherosclerotic calcifications noted in the popliteal artery.  US Venous img lower unilateral Right 03/19/16:  IMPRESSION: 1. Positive for isolated calf  DVT in the gastrocnemius veins. 2. Positive for localized superficial thrombosis of the great saphenous vein at the mid calf. 3. Large suprapatellar knee joint effusion with associated large mildly complex Baker's cyst posteriorly  IMPRESSION AND PLAN:  Right calf DVT, acute. I think his Bakers cyst expanded and caused R knee effusion + compressive effects on venous flow of lower leg, leading to stasis and subsequent DVT. He is to take xarelto for 3 mo minimum.  If Bakers cyst/knee effusion could be treated then I think it would be ok to stop anticoagulant after 3 mo.   I'll refer him to orthopedics for further evaluation and consideration of what may need to be done about his R knee effusion and enlarged Bakers cyst.  An After Visit Summary was printed and given to the patient.  Spent 25 min with pt today, with >50% of this time spent in counseling and care coordination regarding the above problems.  FOLLOW UP: Return for keep appt set for 05/21/16.  Signed:  Crissie Sickles, MD           03/29/2016

## 2016-04-02 DIAGNOSIS — M25561 Pain in right knee: Secondary | ICD-10-CM | POA: Diagnosis not present

## 2016-04-02 DIAGNOSIS — M7121 Synovial cyst of popliteal space [Baker], right knee: Secondary | ICD-10-CM | POA: Diagnosis not present

## 2016-04-04 ENCOUNTER — Encounter: Payer: Self-pay | Admitting: Family Medicine

## 2016-05-16 ENCOUNTER — Other Ambulatory Visit: Payer: Self-pay | Admitting: Family Medicine

## 2016-05-16 ENCOUNTER — Other Ambulatory Visit (INDEPENDENT_AMBULATORY_CARE_PROVIDER_SITE_OTHER): Payer: Medicare Other

## 2016-05-16 DIAGNOSIS — E785 Hyperlipidemia, unspecified: Secondary | ICD-10-CM | POA: Diagnosis not present

## 2016-05-16 LAB — LIPID PANEL
CHOLESTEROL: 215 mg/dL — AB (ref 0–200)
HDL: 51 mg/dL (ref >39.00)
LDL Cholesterol: 137 mg/dL — ABNORMAL HIGH (ref 0–99)
NONHDL: 163.65
Total CHOL/HDL Ratio: 4
Triglycerides: 135 mg/dL (ref 0.0–149.0)
VLDL: 27 mg/dL (ref 0.0–40.0)

## 2016-05-21 ENCOUNTER — Encounter: Payer: Self-pay | Admitting: Family Medicine

## 2016-05-21 ENCOUNTER — Ambulatory Visit (INDEPENDENT_AMBULATORY_CARE_PROVIDER_SITE_OTHER): Payer: Medicare Other | Admitting: Family Medicine

## 2016-05-21 VITALS — BP 135/85 | HR 63 | Temp 97.8°F | Resp 16 | Wt 202.1 lb

## 2016-05-21 DIAGNOSIS — I82491 Acute embolism and thrombosis of other specified deep vein of right lower extremity: Secondary | ICD-10-CM

## 2016-05-21 DIAGNOSIS — H6123 Impacted cerumen, bilateral: Secondary | ICD-10-CM

## 2016-05-21 DIAGNOSIS — G40909 Epilepsy, unspecified, not intractable, without status epilepticus: Secondary | ICD-10-CM

## 2016-05-21 DIAGNOSIS — E7800 Pure hypercholesterolemia, unspecified: Secondary | ICD-10-CM | POA: Diagnosis not present

## 2016-05-21 MED ORDER — ROSUVASTATIN CALCIUM 5 MG PO TABS: 5.0000 mg | ORAL_TABLET | Freq: Every day | ORAL | 6 refills | Status: DC

## 2016-05-21 NOTE — Progress Notes (Signed)
OFFICE VISIT  05/21/2016   CC:  Chief Complaint  Patient presents with  . Follow-up    Discuss labs   HPI:    Patient is a 75 y.o. Caucasian male who presents for f/u seizure d/o, hyperlipidemia, DVT of R gastroc vein for which he started xarelto 03/2016.2  Seizure-free since last visit (has been nearly a decade since last sz).   Last took dilantin ER tabs this morning an hour or 2 ago.  Continues to take xarelto 20mg  qd w/out problem.  Denies any R leg sx's, and says he feels like R knee anterior swelling is much less now.  We discussed his recent lipid panel at length today. He had lots of questions, has been trying diet some but agrees to going on statin today, desires low dose rosuvastatin as start b/c this is what his brothers did well on.  Says both ears feel stopped up, asks me to look at them, has hx of cerumen impactions.  Past Medical History:  Diagnosis Date  . Anisocoria    R pupil larger than L  . Anxiety   . Baker's cyst of knee 03/2016   Right  . Cataract    no surgery  . Colon polyps 11/2008 & 2017  . Diverticulosis 11/2008  . DVT of lower limb, acute ([place]) 03/19/2016   Right gastroc (xarelto started)  . Glaucoma   . History of prostate cancer   . Hyperlipidemia 11/2015  . 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 Lung nodule 10/2014   calcified granulomata  . Nephrolithiasis   . Osteopenia 2009   By DEXA  . 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 Seizures ([place])    last seizure 10 yr  . Skull fracture ([place])    > 30 years ago; now with seizure d/o  . 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 Thrombosis of saphenous vein 2015   Left; PCP in [place] put him on xarelto x 3 mo.  F/u ultrasound was NORMAL.   . 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 CARPAL TUNNEL RELEASE Right   . CARPAL TUNNEL RELEASE    . COLONOSCOPY W/ POLYPECTOMY  12/02/08; 10/12/15   Recall 3 yrs (Dr. Henrene Pastor)  . DEXA  09/02/07   Osteopenia  . INCISION AND DRAINAGE Left    knee  . LITHOTRIPSY    . ROBOT ASSISTED LAPAROSCOPIC RADICAL PROSTATECTOMY  2008  . SUBDURAL HEMATOMA EVACUATION VIA CRANIOTOMY     + temporal intracerebral hematoma--surgical drainage   Pt is taking xarelto 20mg  qd Outpatient Medications Prior to Visit  Medication Sig Dispense Refill  . busPIRone (BUSPAR) 5 MG tablet Take 5 mg by mouth daily.    . phenytoin (DILANTIN) 100 MG ER capsule Take 200 mg by mouth 2 (two) times daily.    . timolol (TIMOPTIC) 0.5 % ophthalmic solution 1 drop 2 (two) times daily.    . travoprost, benzalkonium, (TRAVATAN) 0.004 % ophthalmic solution Place 1 drop into both eyes at bedtime.    . Rivaroxaban (XARELTO) 15 MG TABS tablet 1 tab po bid with meals x 21 days 42 tablet 0  .  rivaroxaban (XARELTO) 20 MG TABS tablet Take 1 tablet (20 mg total) by mouth daily with supper. (Patient not taking: Reported on 05/21/2016) 30 tablet 3  . aspirin 81 MG EC tablet Take 81 mg by mouth daily.     No facility-administered medications prior to visit.     Allergies  Allergen Reactions  . Penicillins Nausea And Vomiting and Rash  . Shellfish Allergy Swelling    ROS As per HPI  PE: Blood pressure 135/85, pulse 63, temperature 97.8 F (36.6 C), temperature source Temporal, resp. rate 16, weight 202 lb 1.9 oz (91.7 kg), SpO2 96 %. Gen: Alert, well appearing.  Patient is oriented to person, place, time, and situation. AFFECT: pleasant, lucid thought and speech. EARS: bilat cerumen impactions 100%. Right knee: popliteal fossa with mild prominence medially/centrally.  Anteriorly the knee has no sign of effusion.  ROM of knee is intact.  LABS:  Lab Results   Component Value Date   TSH 2.12 11/16/2015   Lab Results  Component Value Date   WBC 6.3 11/16/2015   HGB 15.6 11/16/2015   HCT 46.1 11/16/2015   MCV 99.7 11/16/2015   PLT 227.0 11/16/2015   Lab Results  Component Value Date   CREATININE 0.95 11/16/2015   BUN 12 11/16/2015   NA 140 11/16/2015   K 4.2 11/16/2015   CL 105 11/16/2015   CO2 26 11/16/2015   Lab Results  Component Value Date   ALT 10 11/16/2015   AST 17 11/16/2015   ALKPHOS 126 (H) 11/16/2015   BILITOT 0.8 11/16/2015   Lab Results  Component Value Date   CHOL 215 (H) 05/16/2016   Lab Results  Component Value Date   HDL 51.00 05/16/2016   Lab Results  Component Value Date   LDLCALC 137 (H) 05/16/2016   Lab Results  Component Value Date   TRIG 135.0 05/16/2016   Lab Results  Component Value Date   CHOLHDL 4 05/16/2016   Lab Results  Component Value Date   PSA 0.00 Repeated and verified X2. (L) 11/16/2015   Lab Results  Component Value Date   PHENYTOIN 12.4 06/22/2015    IMPRESSION AND PLAN:  1) Seizure d/o: The current medical regimen is effective;  continue present plan and medications. Check phenytoin level today as well as CBC and hepatic panel.  2) Hyperlipidemia: pt agreeable to med trial, prefers rosuvastatin 5mg  qd and this was rx'd today. We'll recheck FLP at f/u in 52mo.  3) Right calf DVT, acute 03/2016. Doing fine on xarelto.  Some of his knee swelling has gone down but I told him that since his Baker's cyst is still present I would have him take the xarelto for 6 mo duration---this means he'll stay on this med for 4 more months.  4) Bilat cerumen impaction: irrigated today by CMA Starla Link but no change. I recommended ENT referral for removal of the cerumen but pt declined at this time, said he wanted to try OTC drops first.  An After Visit Summary was printed and given to the patient.  FOLLOW UP: Return in about 4 months (around 09/18/2016) for f/u DVT.  Signed:  Crissie Sickles, MD           05/21/2016

## 2016-05-21 NOTE — Progress Notes (Signed)
Pre visit review using our clinic review tool, if applicable. No additional management support is needed unless otherwise documented below in the visit note. 

## 2016-05-22 ENCOUNTER — Other Ambulatory Visit (INDEPENDENT_AMBULATORY_CARE_PROVIDER_SITE_OTHER): Payer: Medicare Other

## 2016-05-22 DIAGNOSIS — I82491 Acute embolism and thrombosis of other specified deep vein of right lower extremity: Secondary | ICD-10-CM

## 2016-05-22 DIAGNOSIS — E7800 Pure hypercholesterolemia, unspecified: Secondary | ICD-10-CM

## 2016-05-22 DIAGNOSIS — G40909 Epilepsy, unspecified, not intractable, without status epilepticus: Secondary | ICD-10-CM | POA: Diagnosis not present

## 2016-05-22 LAB — CBC WITH DIFFERENTIAL/PLATELET
Basophils Absolute: 0.1 10*3/uL (ref 0.0–0.1)
Basophils Relative: 0.7 % (ref 0.0–3.0)
EOS ABS: 0.2 10*3/uL (ref 0.0–0.7)
EOS PCT: 2.2 % (ref 0.0–5.0)
HCT: 45.6 % (ref 39.0–52.0)
Hemoglobin: 15.6 g/dL (ref 13.0–17.0)
LYMPHS ABS: 0.9 10*3/uL (ref 0.7–4.0)
Lymphocytes Relative: 12.7 % (ref 12.0–46.0)
MCHC: 34.2 g/dL (ref 30.0–36.0)
MCV: 101 fl — ABNORMAL HIGH (ref 78.0–100.0)
MONO ABS: 0.6 10*3/uL (ref 0.1–1.0)
Monocytes Relative: 8.3 % (ref 3.0–12.0)
NEUTROS PCT: 76.1 % (ref 43.0–77.0)
Neutro Abs: 5.6 10*3/uL (ref 1.4–7.7)
Platelets: 230 10*3/uL (ref 150.0–400.0)
RBC: 4.51 Mil/uL (ref 4.22–5.81)
RDW: 13.6 % (ref 11.5–15.5)
WBC: 7.4 10*3/uL (ref 4.0–10.5)

## 2016-05-22 LAB — HEPATIC FUNCTION PANEL
ALT: 10 U/L (ref 0–53)
AST: 15 U/L (ref 0–37)
Albumin: 4.5 g/dL (ref 3.5–5.2)
Alkaline Phosphatase: 133 U/L — ABNORMAL HIGH (ref 39–117)
BILIRUBIN TOTAL: 0.9 mg/dL (ref 0.2–1.2)
Bilirubin, Direct: 0.2 mg/dL (ref 0.0–0.3)
Total Protein: 6.8 g/dL (ref 6.0–8.3)

## 2016-05-22 NOTE — Addendum Note (Signed)
Addended by: Rubens Dowdy on: 05/22/2016 09:10 AM   Modules accepted: Orders

## 2016-05-23 LAB — PHENYTOIN LEVEL, TOTAL: Phenytoin Lvl: 9.5 ug/mL — ABNORMAL LOW (ref 10.0–20.0)

## 2016-07-18 ENCOUNTER — Other Ambulatory Visit: Payer: Self-pay | Admitting: *Deleted

## 2016-07-18 NOTE — Telephone Encounter (Signed)
Fax from [place].  RF request for dilantin LOV: 05/21/17 Next ov: 09/18/16 Last written: 12/13/15 #150 w/ 2RF  Please advise. Thanks.   Dr. Anitra Lauth pt.

## 2016-07-19 MED ORDER — PHENYTOIN SODIUM EXTENDED 100 MG PO CAPS: 200.0000 mg | ORAL_CAPSULE | Freq: Two times a day (BID) | ORAL | 0 refills | Status: DC

## 2016-08-20 ENCOUNTER — Other Ambulatory Visit: Payer: Self-pay | Admitting: Family Medicine

## 2016-08-20 NOTE — Telephone Encounter (Signed)
Fax from [place].  RF request for xarelto LOV: 05/21/16 Next ov: 09/17/16 Last written: 03/19/16 #30 w/ 3RF

## 2016-09-11 DIAGNOSIS — H401131 Primary open-angle glaucoma, bilateral, mild stage: Secondary | ICD-10-CM | POA: Diagnosis not present

## 2016-09-11 DIAGNOSIS — H5053 Vertical heterophoria: Secondary | ICD-10-CM | POA: Diagnosis not present

## 2016-09-11 DIAGNOSIS — H25813 Combined forms of age-related cataract, bilateral: Secondary | ICD-10-CM | POA: Diagnosis not present

## 2016-09-11 DIAGNOSIS — H527 Unspecified disorder of refraction: Secondary | ICD-10-CM | POA: Diagnosis not present

## 2016-09-11 DIAGNOSIS — H4901 Third [oculomotor] nerve palsy, right eye: Secondary | ICD-10-CM | POA: Diagnosis not present

## 2016-09-12 ENCOUNTER — Encounter: Payer: Self-pay | Admitting: Family Medicine

## 2016-09-17 ENCOUNTER — Ambulatory Visit (INDEPENDENT_AMBULATORY_CARE_PROVIDER_SITE_OTHER): Payer: Medicare Other | Admitting: Family Medicine

## 2016-09-17 ENCOUNTER — Encounter: Payer: Self-pay | Admitting: Family Medicine

## 2016-09-17 VITALS — BP 135/85 | HR 63 | Temp 98.0°F | Resp 16 | Ht 71.0 in | Wt 207.5 lb

## 2016-09-17 DIAGNOSIS — I824Z1 Acute embolism and thrombosis of unspecified deep veins of right distal lower extremity: Secondary | ICD-10-CM

## 2016-09-17 DIAGNOSIS — I82409 Acute embolism and thrombosis of unspecified deep veins of unspecified lower extremity: Secondary | ICD-10-CM | POA: Diagnosis not present

## 2016-09-17 DIAGNOSIS — E7800 Pure hypercholesterolemia, unspecified: Secondary | ICD-10-CM | POA: Diagnosis not present

## 2016-09-17 DIAGNOSIS — G40909 Epilepsy, unspecified, not intractable, without status epilepticus: Secondary | ICD-10-CM

## 2016-09-17 NOTE — Progress Notes (Signed)
Pre visit review using our clinic review tool, if applicable. No additional management support is needed unless otherwise documented below in the visit note. 

## 2016-09-17 NOTE — Progress Notes (Signed)
OFFICE VISIT  09/17/2016   CC:  Chief Complaint  Adam Melton presents with  . Follow-up    RCI, pt is not fasting.    HPI:    Adam Melton is a 76 y.o. Caucasian male who presents for 4 mo f/u DVT in R gastroc, f/u hyperlipidemia after having started crestor 5mg  qd last visit, and hx of well controlled sz d/o on dilantin.  He has voiced that he wants to be very aggressive with monitoring of his dilantin levels.   Has been on xarelto 20mg  qd for his DVT for 6 mo now. We had extensive discussion today about his past hx of left saphenous vein thrombosis in 2015 that was treated with 3 mo of xarelto.  No seizure since I saw him last.  Last took dilantin about 4.5 hours ago.  He has some arm bruising but nothing excessive.  No blood in urine, stool, or from nose.   Past Medical History:  Diagnosis Date  . Anisocoria    R pupil larger than L  . Anxiety   . Baker's cyst of knee 03/2016   Right  . Cataract    no surgery  . Colon polyps 11/2008 & 2017  . Diverticulosis 11/2008  . DVT of lower limb, acute ([place]) 03/19/2016   Right gastroc (xarelto started)  . 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 History of prostate cancer   . Hyperlipidemia 11/2015  . 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 Lung nodule 10/2014   calcified granulomata  . Nephrolithiasis   . Osteopenia 2009   By DEXA  . 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 Seizures ([place])    last seizure 10 yr  . Skull fracture ([place])    > 30 years ago; now with seizure d/o  . 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 Thrombosis of saphenous vein 2015   Left; PCP in [place] put him on xarelto x 3 mo.  F/u ultrasound was NORMAL.  . Tricompartmental disease  of knee 2014   right knee    Past Surgical History:  Procedure Laterality Date  . CARPAL TUNNEL RELEASE Right   . CARPAL TUNNEL RELEASE    . COLONOSCOPY W/ POLYPECTOMY  12/02/08; 10/12/15   Recall 3 yrs (Dr. Henrene Pastor)  . DEXA  09/02/07   Osteopenia  . INCISION AND DRAINAGE Left    knee  . LITHOTRIPSY    . ROBOT ASSISTED LAPAROSCOPIC RADICAL PROSTATECTOMY  2008  . SUBDURAL HEMATOMA EVACUATION VIA CRANIOTOMY     + temporal intracerebral hematoma--surgical drainage    Outpatient Medications Prior to Visit  Medication Sig Dispense Refill  . busPIRone (BUSPAR) 5 MG tablet Take 5 mg by mouth daily.    . phenytoin (DILANTIN) 100 MG ER capsule Take 2 capsules (200 mg total) by mouth 2 (two) times daily. 120 capsule 0  . timolol (TIMOPTIC) 0.5 % ophthalmic solution 1 drop 2 (two) times daily.    . travoprost, benzalkonium, (TRAVATAN) 0.004 % ophthalmic solution Place 1 drop into both eyes at bedtime.    Alveda Reasons 20 MG TABS tablet TAKE 1 TABLET BY MOUTH EVERY DAY WITH SUPPER 30 tablet 3  .  rosuvastatin (CRESTOR) 5 MG tablet Take 1 tablet (5 mg total) by mouth daily. (Adam Melton not taking: Reported on 09/17/2016) 30 tablet 6   No facility-administered medications prior to visit.     Allergies  Allergen Reactions  . Penicillins Nausea And Vomiting and Rash  . Shellfish Allergy Swelling    ROS As per HPI  PE: Blood pressure 135/85, pulse 63, temperature 98 F (36.7 C), temperature source Oral, resp. rate 16, height 5\' 11"  (1.803 m), weight 207 lb 8 oz (94.1 kg), SpO2 97 %. Gen: Alert, well appearing.  Adam Melton is oriented to person, place, time, and situation. AFFECT: pleasant, lucid thought and speech. LEGS: both LL's are without any edema, erythema, tenderness, or cord.  No asymmetry.  Popliteal fossae are palpably normal.  LABS:  Lab Results  Component Value Date   TSH 2.12 11/16/2015   Lab Results  Component Value Date   WBC 7.4 05/22/2016   HGB 15.6 05/22/2016   HCT 45.6  05/22/2016   MCV 101.0 (H) 05/22/2016   PLT 230.0 05/22/2016   Lab Results  Component Value Date   CREATININE 0.95 11/16/2015   BUN 12 11/16/2015   NA 140 11/16/2015   K 4.2 11/16/2015   CL 105 11/16/2015   CO2 26 11/16/2015   Lab Results  Component Value Date   ALT 10 05/22/2016   AST 15 05/22/2016   ALKPHOS 133 (H) 05/22/2016   BILITOT 0.9 05/22/2016   Lab Results  Component Value Date   CHOL 215 (H) 05/16/2016   Lab Results  Component Value Date   HDL 51.00 05/16/2016   Lab Results  Component Value Date   LDLCALC 137 (H) 05/16/2016   Lab Results  Component Value Date   TRIG 135.0 05/16/2016   Lab Results  Component Value Date   CHOLHDL 4 05/16/2016   Lab Results  Component Value Date   PSA 0.00 Repeated and verified X2. (L) 11/16/2015   Lab Results  Component Value Date   PHENYTOIN 9.5 (L) 05/22/2016    IMPRESSION AND PLAN:  1) Acute R gastroc DVT: has now been anticoagulated for 6 mo with xarelto. Repeat L leg venous duplex doppler to reassess. 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  After extensive discussion of options with pt and his wife, we decided to ask Dr. Marin Olp with hematology/oncology to see him for further evaluation and treatment and help with this decision about anticoagulation options/recommendations at this time.  2) Hyperlipidemia: he is going to now start rosuvastatin 5mg  once daily, as we had intended for him to do at last visit 4 mo ago.  3) Seizure disorder: has been under excellent long term control with dilantin. Check dilantin level today (most recent dose was 4.5-5 hours ago).  An After Visit Summary was printed and given to the  Adam Melton.  FOLLOW UP: Return in about 3 months (around 12/18/2016) for routine chronic illness f/u.  Signed:  Crissie Sickles, MD           09/17/2016

## 2016-09-18 ENCOUNTER — Ambulatory Visit: Payer: Medicare Other | Admitting: Family Medicine

## 2016-09-18 LAB — PHENYTOIN LEVEL, TOTAL: Phenytoin Lvl: 10.4 ug/mL (ref 10.0–20.0)

## 2016-09-27 ENCOUNTER — Other Ambulatory Visit: Payer: Self-pay | Admitting: Family

## 2016-09-27 ENCOUNTER — Ambulatory Visit: Payer: Medicare Other

## 2016-09-27 ENCOUNTER — Encounter: Payer: Self-pay | Admitting: Hematology & Oncology

## 2016-09-27 ENCOUNTER — Ambulatory Visit (HOSPITAL_BASED_OUTPATIENT_CLINIC_OR_DEPARTMENT_OTHER)
Admission: RE | Admit: 2016-09-27 | Discharge: 2016-09-27 | Disposition: A | Discharge status: Home / Self Care | Payer: Medicare Other | Source: Ambulatory Visit | Attending: Family Medicine | Admitting: Family Medicine

## 2016-09-27 ENCOUNTER — Ambulatory Visit (HOSPITAL_BASED_OUTPATIENT_CLINIC_OR_DEPARTMENT_OTHER): Payer: Medicare Other | Admitting: Hematology & Oncology

## 2016-09-27 ENCOUNTER — Other Ambulatory Visit (HOSPITAL_BASED_OUTPATIENT_CLINIC_OR_DEPARTMENT_OTHER): Payer: Medicare Other

## 2016-09-27 VITALS — BP 146/92 | HR 96 | Temp 98.2°F | Resp 16 | Wt 209.0 lb

## 2016-09-27 DIAGNOSIS — M79662 Pain in left lower leg: Secondary | ICD-10-CM | POA: Diagnosis not present

## 2016-09-27 DIAGNOSIS — I82891 Chronic embolism and thrombosis of other specified veins: Secondary | ICD-10-CM | POA: Diagnosis not present

## 2016-09-27 DIAGNOSIS — Z8546 Personal history of malignant neoplasm of prostate: Secondary | ICD-10-CM | POA: Diagnosis not present

## 2016-09-27 DIAGNOSIS — M79661 Pain in right lower leg: Secondary | ICD-10-CM

## 2016-09-27 DIAGNOSIS — M7989 Other specified soft tissue disorders: Secondary | ICD-10-CM | POA: Insufficient documentation

## 2016-09-27 DIAGNOSIS — I824Z1 Acute embolism and thrombosis of unspecified deep veins of right distal lower extremity: Secondary | ICD-10-CM

## 2016-09-27 DIAGNOSIS — Z7901 Long term (current) use of anticoagulants: Secondary | ICD-10-CM | POA: Diagnosis not present

## 2016-09-27 DIAGNOSIS — M25562 Pain in left knee: Secondary | ICD-10-CM | POA: Diagnosis not present

## 2016-09-27 HISTORY — PX: OTHER SURGICAL HISTORY: SHX169

## 2016-09-27 LAB — COMPREHENSIVE METABOLIC PANEL (CC13)
ALBUMIN: 4.1 g/dL (ref 3.5–4.8)
ALT: 14 IU/L (ref 0–44)
AST (SGOT): 28 IU/L (ref 0–40)
Albumin/Globulin Ratio: 1.6 (ref 1.2–2.2)
Alkaline Phosphatase, S: 151 IU/L — ABNORMAL HIGH (ref 39–117)
BUN / CREAT RATIO: 17 (ref 10–24)
BUN: 16 mg/dL (ref 8–27)
Bilirubin Total: 0.4 mg/dL (ref 0.0–1.2)
CALCIUM: 8.7 mg/dL (ref 8.6–10.2)
CO2: 23 mmol/L (ref 18–29)
CREATININE: 0.94 mg/dL (ref 0.76–1.27)
Chloride, Ser: 101 mmol/L (ref 96–106)
GFR calc non Af Amer: 80 mL/min/{1.73_m2} (ref >59)
GFR, EST AFRICAN AMERICAN: 92 mL/min/{1.73_m2} (ref >59)
Globulin, Total: 2.5 g/dL (ref 1.5–4.5)
Glucose: 102 mg/dL — ABNORMAL HIGH (ref 65–99)
POTASSIUM: 3.9 mmol/L (ref 3.5–5.2)
Sodium: 135 mmol/L (ref 134–144)
TOTAL PROTEIN: 6.6 g/dL (ref 6.0–8.5)

## 2016-09-27 LAB — CBC WITH DIFFERENTIAL (CANCER CENTER ONLY)
BASO#: 0 10*3/uL (ref 0.0–0.2)
BASO%: 0.7 % (ref 0.0–2.0)
EOS%: 3.4 % (ref 0.0–7.0)
Eosinophils Absolute: 0.2 10*3/uL (ref 0.0–0.5)
HEMATOCRIT: 41.4 % (ref 38.7–49.9)
HGB: 14.6 g/dL (ref 13.0–17.1)
LYMPH#: 0.9 10*3/uL (ref 0.9–3.3)
LYMPH%: 15.3 % (ref 14.0–48.0)
MCH: 34.5 pg — AB (ref 28.0–33.4)
MCHC: 35.3 g/dL (ref 32.0–35.9)
MCV: 98 fL (ref 82–98)
MONO#: 0.5 10*3/uL (ref 0.1–0.9)
MONO%: 9.2 % (ref 0.0–13.0)
NEUT#: 4.1 10*3/uL (ref 1.5–6.5)
NEUT%: 71.4 % (ref 40.0–80.0)
PLATELETS: 194 10*3/uL (ref 145–400)
RBC: 4.23 10*6/uL (ref 4.20–5.70)
RDW: 13 % (ref 11.1–15.7)
WBC: 5.7 10*3/uL (ref 4.0–10.0)

## 2016-09-27 MED ORDER — RIVAROXABAN 10 MG PO TABS: 10.0000 mg | ORAL_TABLET | Freq: Every day | ORAL | 12 refills | Status: DC

## 2016-09-27 NOTE — Progress Notes (Signed)
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 New Patient (Initial Visit)  : I have a blood clot in my right leg.  HPI: Adam Melton is a very nice 76 year old white male. He comes in with his wife. He is of [place] ancestry. He is originally from [place]. He has been down in [place]. He had previously been in [facility]. He just moved up to the [place] area for his family.  He has been pretty healthy. He has had trauma to his brain. He is on Dilantin to help prevent seizures. 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  He apparently had a blood clot in his right leg a year or so ago. He was on anticoagulation. It sounds like he may been on Coumadin. He was then taken off Coumadin and placed on aspirin. He then had a thrombus in the right gastrocnemius vein and September 2017.  He now is on Xarelto. He's done well with Xarelto. If he does have bilateral popliteal cysts. He has seen orthopedic surgery for this. If he did have a Doppler done today. 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 No acute thrombus was noted. He had bilateral popliteal fossa cysts.  There is no history of thrombus in the family. He denies any, trauma to his leg.  He does have a past history of prostate cancer. This was done 11 years ago. He had a prostatectomy in [place].  He does not have diabetes. He does not have any tobacco use. He has occasional alcohol use.  He was kindly referred to the [facility] to help with management of this thrombus.  He has had past surgeries without any difficulty.  There is no history of bleeding. Does have some bruising on his extremities.  Overall, his performance status is ECOG 1.     Past Medical History:  Diagnosis Date  . Anisocoria    R pupil larger than L  . Anxiety   . Baker's  cyst of knee 03/2016   Right  . Cataract    no surgery  . Colon polyps 11/2008 & 2017  . Diverticulosis 11/2008  . DVT of lower limb, acute ([place]) 03/19/2016   Right gastroc (xarelto started)  . 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 History of prostate cancer   . Hyperlipidemia 11/2015  . 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 Lung nodule 10/2014   calcified granulomata  . Nephrolithiasis   . Osteopenia 2009   By DEXA  . Prostate cancer ([place])    robotic prostectomy   . 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 Seizures ([place])    last seizure 10 yr  . Skull fracture ([place])    > 30 years ago; now with seizure d/o  . 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 Thrombosis of saphenous vein 2015   Left; PCP in [place] put him on xarelto x 3 mo.  F/u ultrasound was NORMAL.  . Tricompartmental disease of knee 2014   right knee  :  Past Surgical History:  Procedure Laterality  Date  . CARPAL TUNNEL RELEASE Right   . CARPAL TUNNEL RELEASE    . COLONOSCOPY W/ POLYPECTOMY  12/02/08; 10/12/15   Recall 3 yrs (Dr. Henrene Pastor)  . DEXA  09/02/07   Osteopenia  . INCISION AND DRAINAGE Left    knee  . LITHOTRIPSY    . ROBOT ASSISTED LAPAROSCOPIC RADICAL PROSTATECTOMY  2008  . SUBDURAL HEMATOMA EVACUATION VIA CRANIOTOMY     + temporal intracerebral hematoma--surgical drainage  :   Current Outpatient Prescriptions:  .  busPIRone (BUSPAR) 5 MG tablet, Take 5 mg by mouth daily., Disp: , Rfl:  .  phenytoin (DILANTIN) 100 MG ER capsule, Take 2 capsules (200 mg total) by mouth 2 (two) times daily., Disp: 120 capsule, Rfl: 0 .  rosuvastatin (CRESTOR) 5 MG tablet, Take 1 tablet (5 mg total) by mouth daily. (Patient not taking: Reported on 09/17/2016), Disp: 30 tablet, Rfl: 6 .   timolol (TIMOPTIC) 0.5 % ophthalmic solution, 1 drop 2 (two) times daily., Disp: , Rfl:  .  travoprost, benzalkonium, (TRAVATAN) 0.004 % ophthalmic solution, Place 1 drop into both eyes at bedtime., Disp: , Rfl:  .  XARELTO 20 MG TABS tablet, TAKE 1 TABLET BY MOUTH EVERY Melton WITH SUPPER, Disp: 30 tablet, Rfl: 3:  :  Allergies  Allergen Reactions  . Penicillins Nausea And Vomiting and Rash  . Shellfish Allergy Swelling  :  Family History  Problem Relation Age of Onset  . Stroke Mother   . Alcohol abuse Father   . Diabetes Neg Hx   . Heart disease Neg Hx   . Cancer Neg Hx   . Colon cancer Neg Hx   :  Social History   Social History  . Marital status: Married    Spouse name: N/A  . Number of children: N/A  . Years of education: N/A   Occupational History  . Not on file.   Social History Main Topics  . Smoking status: Never Smoker  . Smokeless tobacco: Never Used  . Alcohol use No  . Drug use: No  . Sexual activity: Not on file   Other Topics Concern  . Not on file   Social History Narrative   Married, has one daughter and 3 grandchildren.   Relocated from [place], [place] 05/2015.   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  :  Pertinent items are noted in HPI.  Exam:  Well-developed and well-nourished white male in no obvious distress. He has some slight ptosis of the right eye. His vital signs are temperature of 98.2. Pulse 96. Blood pressure 146/92. Weight is 209 pounds. Head and neck exam shows no ocular or oral lesions. He has some ptosis of the right eye. There is no adenopathy in the neck. Thyroid is nonpalpable. Lungs are clear bilaterally. Cardiac exam regular rate and rhythm with no murmurs, rubs or bruits. Abdomen is soft. Has good bowel sounds. There is no fluid wave. There is no palpable liver or spleen tip. There is no abdominal mass. There is no palpable adenopathy. Back exam shows no tenderness over the spine, ribs or hips. Extremities shows no  clubbing, cyanosis or edema. No obvious venous cord is noted in the legs. He has a negative Homans sign. Neurological exam shows no focal neurological deficits. Skin exam shows some scattered ecchymoses.     Recent Labs  09/27/16 1329  WBC 5.7  HGB 14.6  HCT 41.4  PLT 194   No results for input(s): NA, K, CL, CO2, GLUCOSE, BUN,  CREATININE, CALCIUM in the last 72 hours.  Blood smear review: None    Pathology: None     Assessment and Plan:  Adam Melton is a 76 year old white male. He has a thrombus in the right gastrocnemius vein. These typically have a very rare incidence of propagating to the thigh and then causing pulmonary emboli.  It is unclear whether not he has any type of hypercoagulable condition. I don't think that he has. However, we probably will have to consider this.  I did send off a thrombophilic panel on him.  I think that he would be a good candidate for low-dose Xarelto. I think this be very reasonable and should cut the risk of recurrent thrombus by 70%.  I talked to he and his wife about this. They're both very nice. It was a lot of [place] talking to him about [place]  I really don't think that he should have any problems with post phlebitic syndrome. This blood clot is isolated to the lower leg. It is not in the thigh or higher.  I spent about 45 visit he and his wife. Again they're both very nice.   I will like to see him back in about 3 months.  We may consider another Doppler in 6 months.

## 2016-09-28 LAB — D-DIMER, QUANTITATIVE (NOT AT ARMC): D-DIMER: 0.31 mg{FEU}/L (ref 0.00–0.49)

## 2016-09-28 LAB — ANTITHROMBIN III: ANTITHROMBIN ACTIVITY: 107 % (ref 75–135)

## 2016-09-28 LAB — PROTEIN C ACTIVITY: PROTEIN C ACTIVITY: 106 % (ref 73–180)

## 2016-09-28 LAB — PROTEIN S, TOTAL: Protein S, Total: 94 % (ref 60–150)

## 2016-09-28 LAB — PROTEIN S ACTIVITY: Protein S-Functional: 93 % (ref 63–140)

## 2016-09-29 LAB — LUPUS ANTICOAGULANT PANEL
DRVVT MIX: 41 s (ref 0.0–47.0)
PTT-LA: 37.1 s (ref 0.0–51.9)
dRVVT: 49.6 s — ABNORMAL HIGH (ref 0.0–47.0)

## 2016-09-29 LAB — BETA-2-GLYCOPROTEIN I ABS, IGG/M/A: Beta-2 Glyco 1 IgA: <9 GPI IgA units (ref 0–25)

## 2016-09-30 LAB — PROTEIN C, TOTAL: Protein C Antigen: 84 % (ref 60–150)

## 2016-10-01 LAB — CARDIOLIPIN ANTIBODIES, IGG, IGM, IGA
Anticardiolipin Ab,IgA,Qn: <9 APL U/mL (ref 0–11)
Anticardiolipin Ab,IgG,Qn: <9 GPL U/mL (ref 0–14)

## 2016-10-02 LAB — PROTHROMBIN GENE MUTATION

## 2016-10-02 LAB — FACTOR 5 LEIDEN

## 2016-12-18 ENCOUNTER — Ambulatory Visit (INDEPENDENT_AMBULATORY_CARE_PROVIDER_SITE_OTHER): Payer: Medicare Other | Admitting: Family Medicine

## 2016-12-18 ENCOUNTER — Encounter: Payer: Self-pay | Admitting: Family Medicine

## 2016-12-18 VITALS — BP 130/82 | HR 77 | Temp 98.1°F | Resp 16 | Wt 205.0 lb

## 2016-12-18 DIAGNOSIS — M797 Fibromyalgia: Secondary | ICD-10-CM

## 2016-12-18 DIAGNOSIS — Z8546 Personal history of malignant neoplasm of prostate: Secondary | ICD-10-CM | POA: Diagnosis not present

## 2016-12-18 DIAGNOSIS — G40909 Epilepsy, unspecified, not intractable, without status epilepticus: Secondary | ICD-10-CM

## 2016-12-18 DIAGNOSIS — E7800 Pure hypercholesterolemia, unspecified: Secondary | ICD-10-CM | POA: Diagnosis not present

## 2016-12-18 DIAGNOSIS — Z1283 Encounter for screening for malignant neoplasm of skin: Secondary | ICD-10-CM | POA: Diagnosis not present

## 2016-12-18 DIAGNOSIS — I82409 Acute embolism and thrombosis of unspecified deep veins of unspecified lower extremity: Secondary | ICD-10-CM

## 2016-12-18 NOTE — Progress Notes (Signed)
OFFICE VISIT  12/18/2016   CC:  Chief Complaint  Patient presents with  . Follow-up    RCI   HPI:    Patient is a 76 y.o. Caucasian male who presents for 3 mo f/u hyperlipidemia, seizure d/o, anxiety. He also had a recurrence of DVT and was started on xarelto 9 mo ago, and last visit I referred him to Dr. Marin Olp for further evaluation.  I reviewed Dr. Antonieta Pert evaluation (see PMH/PSH section for details. He is now on 10mg  qd xarelto for prophylaxis of DVT--anticipate a year of this.  No nose bleeds, no melena/hematochezie, no gross hematuria.  Has been doing excellent regarding seizure control on long term dilantin.  Pt is very nervous about dilantin levels and prefers this to be rechecked at every f/u visit.  No seizures since I last saw him.  Hyperlip: last visit we restarted him on 5mg  crestor qd.  Tolerating med w/out problem  Past Medical History:  Diagnosis Date  . Anisocoria    R pupil larger than L  . Anxiety   . Baker's cyst of knee 03/2016   Right  . Cataract    no surgery  . Colon polyps 11/2008 & 2017  . Diverticulosis 11/2008  . DVT of lower limb, acute ([place]) 03/19/2016   Right gastroc (xarelto started).  Full thrombophilia panel by Dr. Marin Olp 09/2016 was NORMAL.  Dr. Marin Olp recommended low dose xarelto continuation in order to decrease the risk of recurrent DVT.  Marland Kitchen Glaucoma    Primary open angle glaucoma [name], MD)  . History of prostate cancer   . Hyperlipidemia 11/2015  . 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 Lung nodule 10/2014   calcified granulomata  . Nephrolithiasis   . Osteopenia 2009   By DEXA  . Prostate cancer ([place])    robotic prostectomy   . 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 Seizures ([place])    last seizure 10 yr  . Skull fracture ([place])    > 30  years ago; now with seizure d/o  . 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 Thrombosis of saphenous vein 2015   Left; PCP in [place] put him on xarelto x 3 mo.  F/u ultrasound was NORMAL.  . 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 CARPAL TUNNEL RELEASE Right   . CARPAL TUNNEL RELEASE    . COLONOSCOPY W/ POLYPECTOMY  12/02/08; 10/12/15   Recall 3 yrs (Dr. Henrene Pastor)  . DEXA  09/02/07   Osteopenia  . INCISION AND DRAINAGE Left    knee  . LITHOTRIPSY    . ROBOT ASSISTED LAPAROSCOPIC RADICAL PROSTATECTOMY  2008  . SUBDURAL HEMATOMA EVACUATION VIA CRANIOTOMY     + temporal intracerebral hematoma--surgical drainage  . 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    Outpatient Medications Prior to Visit  Medication Sig Dispense Refill  . busPIRone (BUSPAR) 5 MG tablet Take 5 mg by mouth daily.    . phenytoin (DILANTIN) 100 MG ER capsule Take 2 capsules (200 mg total) by mouth 2 (two) times daily. 120 capsule 0  . rivaroxaban (  XARELTO) 10 MG TABS tablet Take 1 tablet (10 mg total) by mouth daily. 30 tablet 12  . rosuvastatin (CRESTOR) 5 MG tablet Take 1 tablet (5 mg total) by mouth daily. 30 tablet 6  . timolol (TIMOPTIC) 0.5 % ophthalmic solution 1 drop 2 (two) times daily.    . travoprost, benzalkonium, (TRAVATAN) 0.004 % ophthalmic solution Place 1 drop into both eyes at bedtime.     No facility-administered medications prior to visit.     Allergies  Allergen Reactions  . Penicillins Nausea And Vomiting and Rash  . Shellfish Allergy Swelling    ROS As per HPI  PE: Blood pressure 130/82, pulse 77, temperature 98.1 F (36.7 C), temperature source Oral, resp. rate 16, weight 205 lb (93 kg), SpO2 95 %. Gen: Alert, well appearing.  Patient is oriented to person, place, time, and situation. AFFECT: pleasant,  lucid thought and speech. CV: RRR, no m/r/g.   LUNGS: CTA bilat, nonlabored resps, good aeration in all lung fields. EXT: no clubbing or cyanosis.  He has trace to 1+ pitting edema in both LL's.  No nodule, erythema, or tenderness of either calf.  LABS:    Chemistry      Component Value Date/Time   NA 135 09/27/2016 1329   K 3.9 09/27/2016 1329   CL 101 09/27/2016 1329   CO2 23 09/27/2016 1329   BUN 16 09/27/2016 1329   CREATININE 0.94 09/27/2016 1329      Component Value Date/Time   CALCIUM 8.7 09/27/2016 1329   ALKPHOS 151 (H) 09/27/2016 1329   AST 28 09/27/2016 1329   ALT 14 09/27/2016 1329   BILITOT 0.4 09/27/2016 1329     Lab Results  Component Value Date   WBC 5.7 09/27/2016   HGB 14.6 09/27/2016   HCT 41.4 09/27/2016   MCV 98 09/27/2016   PLT 194 09/27/2016   Lab Results  Component Value Date   CHOL 215 (H) 05/16/2016   HDL 51.00 05/16/2016   LDLCALC 137 (H) 05/16/2016   TRIG 135.0 05/16/2016   CHOLHDL 4 05/16/2016   Lab Results  Component Value Date   PSA 0.00 Repeated and verified X2. (L) 11/16/2015    IMAGING: LE venous doppler U/S 09/27/16: IMPRESSION: 1. No evidence of acute deep venous thrombosis in either lower extremity. 2. 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 3. Bilateral popliteal fossa cysts, larger on the right.  IMPRESSION AND PLAN:  1) Hx of recurrent DVT; now on xarelto at 10mg  qd dosing as per hematologist's recommendation. I anticipate him staying on this dosage for 1 yr duration. Thrombophilia lab eval was NEG/Normal. Doing well today.  2) Seizure d/o: well controlled on dilantin 100mg  ER tabs, 2 tabs bid. He'll return to get dilantin trough.  3) Hyperlipidemia: tolerating crestor 5mg  qd.  Return for lab visit for fasting lipid panel.  4) Hx of prostate cancer: pt desires ongoing annual PSA surveillance, so I ordered this to be done with upcoming fasting labs.  5) Skin ca screening: as per  pt request, referred him to a dermatologist in [place].  An After Visit Summary was printed and given to the patient.  FOLLOW UP: Return in about 6 months (around 06/19/2017) for routine chronic illness f/u; also needs lab appt for fasting labs at pt's convenience; needs AWV-kim.  Signed:  Crissie Sickles, MD           12/18/2016

## 2016-12-18 NOTE — Patient Instructions (Signed)
Do not take your dilantin the morning of your lab visit (until after your blood is drawn).

## 2016-12-23 ENCOUNTER — Encounter: Payer: Self-pay | Admitting: Family Medicine

## 2016-12-23 ENCOUNTER — Other Ambulatory Visit (INDEPENDENT_AMBULATORY_CARE_PROVIDER_SITE_OTHER): Payer: Medicare Other

## 2016-12-23 DIAGNOSIS — E7800 Pure hypercholesterolemia, unspecified: Secondary | ICD-10-CM

## 2016-12-23 DIAGNOSIS — G40909 Epilepsy, unspecified, not intractable, without status epilepticus: Secondary | ICD-10-CM

## 2016-12-23 DIAGNOSIS — Z8546 Personal history of malignant neoplasm of prostate: Secondary | ICD-10-CM

## 2016-12-23 LAB — LIPID PANEL
CHOL/HDL RATIO: 4
Cholesterol: 177 mg/dL (ref 0–200)
HDL: 50.5 mg/dL (ref >39.00)
LDL CALC: 108 mg/dL — AB (ref 0–99)
NONHDL: 126.92
Triglycerides: 94 mg/dL (ref 0.0–149.0)
VLDL: 18.8 mg/dL (ref 0.0–40.0)

## 2016-12-23 LAB — PSA, MEDICARE

## 2016-12-24 LAB — PHENYTOIN LEVEL, TOTAL: Phenytoin Lvl: 8.3 mg/L — ABNORMAL LOW (ref 10.0–20.0)

## 2017-01-03 ENCOUNTER — Ambulatory Visit (HOSPITAL_BASED_OUTPATIENT_CLINIC_OR_DEPARTMENT_OTHER): Payer: Medicare Other | Admitting: Hematology & Oncology

## 2017-01-03 ENCOUNTER — Other Ambulatory Visit (HOSPITAL_BASED_OUTPATIENT_CLINIC_OR_DEPARTMENT_OTHER): Payer: Medicare Other

## 2017-01-03 VITALS — BP 138/73 | HR 57 | Temp 98.4°F | Resp 18 | Wt 205.0 lb

## 2017-01-03 DIAGNOSIS — I824Z1 Acute embolism and thrombosis of unspecified deep veins of right distal lower extremity: Secondary | ICD-10-CM | POA: Diagnosis not present

## 2017-01-03 DIAGNOSIS — Z7901 Long term (current) use of anticoagulants: Secondary | ICD-10-CM

## 2017-01-03 DIAGNOSIS — I825Z1 Chronic embolism and thrombosis of unspecified deep veins of right distal lower extremity: Secondary | ICD-10-CM

## 2017-01-03 LAB — CMP (CANCER CENTER ONLY)
ALBUMIN: 3.7 g/dL (ref 3.3–5.5)
ALK PHOS: 125 U/L — AB (ref 26–84)
ALT: 22 U/L (ref 10–47)
AST: 23 U/L (ref 11–38)
BILIRUBIN TOTAL: 0.8 mg/dL (ref 0.20–1.60)
BUN, Bld: 13 mg/dL (ref 7–22)
CALCIUM: 9.1 mg/dL (ref 8.0–10.3)
CO2: 32 mEq/L (ref 18–33)
CREATININE: 1.1 mg/dL (ref 0.6–1.2)
Chloride: 108 mEq/L (ref 98–108)
Glucose, Bld: 126 mg/dL — ABNORMAL HIGH (ref 73–118)
Potassium: 4.5 mEq/L (ref 3.3–4.7)
SODIUM: 144 meq/L (ref 128–145)
TOTAL PROTEIN: 6.6 g/dL (ref 6.4–8.1)

## 2017-01-03 LAB — CBC WITH DIFFERENTIAL (CANCER CENTER ONLY)
BASO#: 0 10*3/uL (ref 0.0–0.2)
BASO%: 0.5 % (ref 0.0–2.0)
EOS ABS: 0.2 10*3/uL (ref 0.0–0.5)
EOS%: 2.9 % (ref 0.0–7.0)
HCT: 44.3 % (ref 38.7–49.9)
HEMOGLOBIN: 15.5 g/dL (ref 13.0–17.1)
LYMPH#: 1 10*3/uL (ref 0.9–3.3)
LYMPH%: 18.5 % (ref 14.0–48.0)
MCH: 35.1 pg — AB (ref 28.0–33.4)
MCHC: 35 g/dL (ref 32.0–35.9)
MCV: 101 fL — AB (ref 82–98)
MONO#: 0.5 10*3/uL (ref 0.1–0.9)
MONO%: 9.4 % (ref 0.0–13.0)
NEUT%: 68.7 % (ref 40.0–80.0)
NEUTROS ABS: 3.8 10*3/uL (ref 1.5–6.5)
PLATELETS: 191 10*3/uL (ref 145–400)
RBC: 4.41 10*6/uL (ref 4.20–5.70)
RDW: 12.8 % (ref 11.1–15.7)
WBC: 5.5 10*3/uL (ref 4.0–10.0)

## 2017-01-03 NOTE — Progress Notes (Signed)
Hematology and Oncology Follow Up Visit  Adam Melton [id number] 08-Dec-1940 76 y.o. 01/03/2017   Principle Diagnosis:   DVT of the right gastrocnemius vein  Current Therapy:    Xarelto 20 mg by mouth daily-to complete full dose therapy for 1 year in September 2018     Interim History:  Adam Melton is back for follow-up. This is a second office visit. As always, it is fine to talk with him.  We saw him back in March. Since then, he's been doing okay. He has rushes about his medications. These are medications that I do not deal with. The main one is his Dilantin. The other is his Crestor. I told him that he has talked to his family doctor regarding these.  He is taking his Xarelto. He's had no bleeding. His right leg feels better. There is not as much pain or swelling in the right lower leg. He does, however, complain of some swelling and tightness in the left lower leg.  He's had no problems with cough or shortness of breath. There is no chest wall pain.  He's had no fever. He's had no rashes.  He is planning to go to the [facility]. He and some friends will be traveling the [place]. They apparently are going on a snake expedition. I'm not sure if they want to tangle with a 20 foot [place]!!!!  Overall, his performance status is ECOG 0.  Medications:  Current Outpatient Prescriptions:  .  busPIRone (BUSPAR) 5 MG tablet, Take 5 mg by mouth daily., Disp: , Rfl:  .  phenytoin (DILANTIN) 100 MG ER capsule, Take 2 capsules (200 mg total) by mouth 2 (two) times daily., Disp: 120 capsule, Rfl: 0 .  rivaroxaban (XARELTO) 10 MG TABS tablet, Take 1 tablet (10 mg total) by mouth daily., Disp: 30 tablet, Rfl: 12 .  rosuvastatin (CRESTOR) 5 MG tablet, Take 1 tablet (5 mg total) by mouth daily., Disp: 30 tablet, Rfl: 6 .  timolol (TIMOPTIC) 0.5 % ophthalmic solution, 1 drop 2 (two) times daily., Disp: , Rfl:  .  travoprost, benzalkonium, (TRAVATAN) 0.004 % ophthalmic solution, Place 1 drop  into both eyes at bedtime., Disp: , Rfl:  .  vitamin C (ASCORBIC ACID) 500 MG tablet, Take 500 mg by mouth daily., Disp: , Rfl:   Allergies:  Allergies  Allergen Reactions  . Penicillins Nausea And Vomiting and Rash  . Shellfish Allergy Swelling    Past Medical History, Surgical history, Social history, and Family History were reviewed and updated.  Review of Systems:  As above  Physical Exam:  weight is 205 lb (93 kg). His oral temperature is 98.4 F (36.9 C). His blood pressure is 138/73 and his pulse is 57 (abnormal). His respiration is 18 and oxygen saturation is 99%.   Wt Readings from Last 3 Encounters:  01/03/17 205 lb (93 kg)  12/18/16 205 lb (93 kg)  09/27/16 209 lb (94.8 kg)     Head and neck exam shows no ocular or oral lesions. He has some ptosis of the right eye. There is no adenopathy in the neck. Thyroid is nonpalpable. Lungs are clear bilaterally. Cardiac exam regular rate and rhythm with no murmurs, rubs or bruits. Abdomen is soft. Has good bowel sounds. There is no fluid wave. There is no palpable liver or spleen tip. There is no abdominal mass. There is no palpable adenopathy. Back exam shows no tenderness over the spine, ribs or hips. Extremities shows no clubbing, cyanosis or edema. No  obvious venous cord is noted in the legs. He has a negative Homans sign. Neurological exam shows no focal neurological deficits. Skin exam shows some scattered ecchymoses.   Lab Results  Component Value Date   WBC 5.5 01/03/2017   HGB 15.5 01/03/2017   HCT 44.3 01/03/2017   MCV 101 (H) 01/03/2017   PLT 191 01/03/2017     Chemistry      Component Value Date/Time   NA 144 01/03/2017 1114   K 4.5 01/03/2017 1114   CL 108 01/03/2017 1114   CO2 32 01/03/2017 1114   BUN 13 01/03/2017 1114   CREATININE 1.1 01/03/2017 1114      Component Value Date/Time   CALCIUM 9.1 01/03/2017 1114   ALKPHOS 125 (H) 01/03/2017 1114   AST 23 01/03/2017 1114   ALT 22 01/03/2017 1114    BILITOT 0.80 01/03/2017 1114         Impression and Plan: Adam Melton is a 76 year old white male. He developed a thrombus in the right lower leg. He has had prior thromboembolic disease. He was taken off blood thinner with Coumadin and then developed another thrombus.  I'm not sure that we really need to commit him to lifelong anticoagulation. I definitely would put him on maintenance therapy after 1 year of full dose Xarelto.  He will complete a full dose Xarelto in September.  After this, we will place him on low-dose Xarelto for at least a year. Given that he does travel a lot, he may have to be on some form of anticoagulation long-term.  I will see him back in 3 months. We will get Dopplers but we see him back.   [name], MD 6/22/201812:43 PM

## 2017-01-04 LAB — D-DIMER, QUANTITATIVE (NOT AT ARMC): D-DIMER: 0.33 mg{FEU}/L (ref 0.00–0.49)

## 2017-01-08 ENCOUNTER — Encounter: Payer: Self-pay | Admitting: Family Medicine

## 2017-01-31 ENCOUNTER — Other Ambulatory Visit: Payer: Self-pay | Admitting: Family Medicine

## 2017-02-10 DIAGNOSIS — L570 Actinic keratosis: Secondary | ICD-10-CM | POA: Diagnosis not present

## 2017-02-10 DIAGNOSIS — D225 Melanocytic nevi of trunk: Secondary | ICD-10-CM | POA: Diagnosis not present

## 2017-02-10 DIAGNOSIS — L821 Other seborrheic keratosis: Secondary | ICD-10-CM | POA: Diagnosis not present

## 2017-02-10 DIAGNOSIS — C4441 Basal cell carcinoma of skin of scalp and neck: Secondary | ICD-10-CM | POA: Diagnosis not present

## 2017-02-10 DIAGNOSIS — L720 Epidermal cyst: Secondary | ICD-10-CM | POA: Diagnosis not present

## 2017-02-11 DIAGNOSIS — C4441 Basal cell carcinoma of skin of scalp and neck: Secondary | ICD-10-CM | POA: Diagnosis not present

## 2017-02-19 DIAGNOSIS — H25813 Combined forms of age-related cataract, bilateral: Secondary | ICD-10-CM | POA: Diagnosis not present

## 2017-02-19 DIAGNOSIS — H527 Unspecified disorder of refraction: Secondary | ICD-10-CM | POA: Diagnosis not present

## 2017-02-19 DIAGNOSIS — H401131 Primary open-angle glaucoma, bilateral, mild stage: Secondary | ICD-10-CM | POA: Diagnosis not present

## 2017-02-19 DIAGNOSIS — H5053 Vertical heterophoria: Secondary | ICD-10-CM | POA: Diagnosis not present

## 2017-02-19 DIAGNOSIS — H4901 Third [oculomotor] nerve palsy, right eye: Secondary | ICD-10-CM | POA: Diagnosis not present

## 2017-02-20 ENCOUNTER — Encounter: Payer: Self-pay | Admitting: Family Medicine

## 2017-02-21 ENCOUNTER — Other Ambulatory Visit: Payer: Self-pay | Admitting: *Deleted

## 2017-02-21 NOTE — Telephone Encounter (Signed)
RF request for phenytoin LOV: 12/18/16 Next ov: 06/17/17 Last written: 07/19/16 #120 w/ 0RF  Please advise. Thanks.

## 2017-02-23 MED ORDER — PHENYTOIN SODIUM EXTENDED 100 MG PO CAPS: 200.0000 mg | ORAL_CAPSULE | Freq: Two times a day (BID) | ORAL | 1 refills | Status: DC

## 2017-03-13 NOTE — Progress Notes (Signed)
Subjective:   Adam Melton is a 76 y.o. male who presents for Medicare Annual/Subsequent preventive examination.  Review of Systems:  No ROS.  Medicare Wellness Visit. Additional risk factors are reflected in the social history.  Cardiac Risk Factors include: dyslipidemia;advanced age (>43men, >84 women);male gender;family history of premature cardiovascular disease   Sleep patterns: Sleeps 7 hours, feels rested.  Home Safety/Smoke Alarms: Feels safe in home. Smoke alarms in place.  Living environment; residence and Firearm Safety: Lives with wife in apt on 2nd floor.  Seat Belt Safety/Bike Helmet: Wears seat belt.   Male:   CCS-Colonoscopy 10/12/2015, polyps. Recall 3 years.      PSA-  Lab Results  Component Value Date   PSA 0.00 Repeated and verified X2. (L) 12/23/2016   PSA 0.00 Repeated and verified X2. (L) 11/16/2015       Objective:    Vitals: BP (!) 144/78 (BP Location: Left Arm, Cuff Size: Normal)   Pulse 72   Ht 5\' 11"  (1.803 m)   Wt 198 lb 1.6 oz (89.9 kg)   SpO2 97%   BMI 27.63 kg/m   Body mass index is 27.63 kg/m.  Tobacco History  Smoking Status  . Never Smoker  Smokeless Tobacco  . Never Used     Counseling given: Not Answered   Past Medical History:  Diagnosis Date  . Anisocoria    R pupil larger than L  . Anxiety   . Baker's cyst of knee 03/2016   Right  . Cataract    Bilat; no surgery  . Colon polyps 11/2008 & 2017  . Diverticulosis 11/2008  . DVT of lower limb, acute ([place]) 03/19/2016   Right gastroc (xarelto started).  Full thrombophilia panel by Dr. Marin Olp 09/2016 was NORMAL.  Dr. Marin Olp recommended low dose xarelto continuation in order to decrease the risk of recurrent DVT (finishes 1 yr of anticoag 03/2017, then will start low dose xarelto).  . Glaucoma    Primary open angle glaucoma OU, mild stage [name], MD)  . History of prostate cancer   . Hyperlipidemia 11/2015  . 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 Lung nodule 10/2014   calcified granulomata  . Nephrolithiasis   . Osteopenia 2009   By DEXA  . 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 Ptosis, paralytic, right   . 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  Last seizure 2007.  Marland Kitchen Skull fracture ([name])    > 30 years ago; now with seizure d/o  . 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  F/u ultrasound was NORMAL.  . 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 CARPAL TUNNEL RELEASE Right   . CARPAL TUNNEL RELEASE    . COLONOSCOPY W/ POLYPECTOMY  12/02/08; 10/12/15   Recall 3 yrs (Dr. Henrene Pastor)  . DEXA  09/02/07   Osteopenia  . INCISION AND DRAINAGE Left    knee  . LITHOTRIPSY    . ROBOT ASSISTED LAPAROSCOPIC RADICAL PROSTATECTOMY  2008  . SUBDURAL HEMATOMA EVACUATION VIA CRANIOTOMY     + temporal intracerebral hematoma--surgical  drainage  . 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   Family History  Problem Relation Age of Onset  . Stroke Mother   . Alcohol abuse Father   . Cancer Sister        Ovarian  . Heart disease Brother   . Diabetes Neg Hx   . Colon cancer Neg Hx    History  Sexual Activity  . Sexual activity: Not on file    Outpatient Encounter Prescriptions as of 03/14/2017  Medication Sig  . busPIRone (BUSPAR) 5 MG tablet Take 5 mg by mouth daily.  . phenytoin (DILANTIN) 100 MG ER capsule Take 2 capsules (200 mg total) by mouth 2 (two) times daily.  . rivaroxaban (XARELTO) 10 MG TABS tablet Take 1 tablet (10 mg total) by mouth daily.  . rosuvastatin (CRESTOR) 5 MG tablet TAKE 1 TABLET BY  MOUTH EVERY DAY  . timolol (TIMOPTIC) 0.5 % ophthalmic solution 1 drop 2 (two) times daily.  . travoprost, benzalkonium, (TRAVATAN) 0.004 % ophthalmic solution Place 1 drop into both eyes at bedtime.  . vitamin C (ASCORBIC ACID) 500 MG tablet Take 500 mg by mouth daily.   No facility-administered encounter medications on file as of 03/14/2017.     Activities of Daily Living In your present state of health, do you have any difficulty performing the following activities: 03/14/2017  Hearing? Y  Vision? N  Difficulty concentrating or making decisions? N  Walking or climbing stairs? N  Dressing or bathing? N  Doing errands, shopping? Y  Preparing Food and eating ? N  Using the Toilet? N  In the past six months, have you accidently leaked urine? N  Do you have problems with loss of bowel control? N  Managing your Medications? N  Managing your Finances? N  Housekeeping or managing your Housekeeping? N  Some recent data might be hidden    Patient Care Team: Tammi Sou, MD as PCP - General (Family Medicine) Druscilla Brownie, MD as Consulting Physician (Dermatology) Latanya Maudlin, MD as Consulting Physician (Orthopedic Surgery) [name], MD as Consulting Physician (Ophthalmology) [name], MD as Consulting Physician (Oncology)   Assessment:    Physical assessment deferred to PCP.  Exercise Activities and Dietary recommendations Current Exercise Habits: Home exercise routine, Type of exercise: walking, Time (Minutes): 60, Frequency (Times/Week): 3, Weekly Exercise (Minutes/Week): 180, Exercise limited by: None identified Diet (meal preparation, eat out, water intake, caffeinated beverages, dairy products, fruits and vegetables): Drinks water  Breakfast: oatmeal Lunch: deli lettuce wrap, fruit; protein bars (low carb) Dinner: protein and vegetables.    Goals    . Weight (lb) < 185 lb (83.9 kg)          Lose weight by continuing low carb meals and walking.         Fall Risk Fall Risk  03/14/2017 11/16/2015  Falls in the past year? No No   Depression Screen PHQ 2/9 Scores 03/14/2017 11/16/2015  PHQ - 2 Score 0 0    Cognitive Function MMSE - Mini Mental State Exam 03/14/2017  Orientation to time 4  Orientation to Place 5  Registration 3  Attention/ Calculation 5  Recall 3  Language- name 2 objects 2  Language- repeat 1  Language- follow 3 step command 3  Language- read & follow direction 1  Write a sentence 1  Copy design 1  Total score 29  Immunization History  Administered Date(s) Administered  . Influenza, High Dose Seasonal PF 06/22/2015  . Pneumococcal Conjugate-13 11/16/2015   Screening Tests Health Maintenance  Topic Date Due  . PNA vac Low Risk Adult (2 of 2 - PPSV23) 11/15/2016  . INFLUENZA VACCINE  02/12/2017  . TETANUS/TDAP  09/17/2017 (Originally 03/03/1960)  . COLONOSCOPY  10/12/2018   Declines PPSV23, Influenza and Shingrix.      Plan:    Bring a copy of your living will and/or healthcare power of attorney to your next office visit.  Continue doing brain stimulating activities (puzzles, reading, adult coloring books, staying active) to keep memory sharp.    I have personally reviewed and noted the following in the patient's chart:   . Medical and social history . Use of alcohol, tobacco or illicit drugs  . Current medications and supplements . Functional ability and status . Nutritional status . Physical activity . Advanced directives . List of other physicians . Hospitalizations, surgeries, and ER visits in previous 12 months . Vitals . Screenings to include cognitive, depression, and falls . Referrals and appointments  In addition, I have reviewed and discussed with patient certain preventive protocols, quality metrics, and best practice recommendations. A written personalized care plan for preventive services as well as general preventive health recommendations were provided to patient.      Gerilyn Nestle, RN  03/14/2017  PCP Note: -Pt to monitor BP's at pharmacy -Requesting PCP take over Dilantin Rx when refill needed.

## 2017-03-14 ENCOUNTER — Ambulatory Visit (INDEPENDENT_AMBULATORY_CARE_PROVIDER_SITE_OTHER): Payer: Medicare Other

## 2017-03-14 VITALS — BP 144/78 | HR 72 | Ht 71.0 in | Wt 198.1 lb

## 2017-03-14 DIAGNOSIS — Z0000 Encounter for general adult medical examination without abnormal findings: Secondary | ICD-10-CM | POA: Diagnosis not present

## 2017-03-14 NOTE — Patient Instructions (Addendum)
Bring a copy of your living will and/or healthcare power of attorney to your next office visit.  Continue doing brain stimulating activities (puzzles, reading, adult coloring books, staying active) to keep memory sharp.    Fall Prevention in the Home Falls can cause injuries. They can happen to people of all ages. There are many things you can do to make your home safe and to help prevent falls. What can I do on the outside of my home?  Regularly fix the edges of walkways and driveways and fix any cracks.  Remove anything that might make you trip as you walk through a door, such as a raised step or threshold.  Trim any bushes or trees on the path to your home.  Use bright outdoor lighting.  Clear any walking paths of anything that might make someone trip, such as rocks or tools.  Regularly check to see if handrails are loose or broken. Make sure that both sides of any steps have handrails.  Any raised decks and porches should have guardrails on the edges.  Have any leaves, snow, or ice cleared regularly.  Use sand or salt on walking paths during winter.  Clean up any spills in your garage right away. This includes oil or grease spills. What can I do in the bathroom?  Use night lights.  Install grab bars by the toilet and in the tub and shower. Do not use towel bars as grab bars.  Use non-skid mats or decals in the tub or shower.  If you need to sit down in the shower, use a plastic, non-slip stool.  Keep the floor dry. Clean up any water that spills on the floor as soon as it happens.  Remove soap buildup in the tub or shower regularly.  Attach bath mats securely with double-sided non-slip rug tape.  Do not have throw rugs and other things on the floor that can make you trip. What can I do in the bedroom?  Use night lights.  Make sure that you have a light by your bed that is easy to reach.  Do not use any sheets or blankets that are too big for your bed. They  should not hang down onto the floor.  Have a firm chair that has side arms. You can use this for support while you get dressed.  Do not have throw rugs and other things on the floor that can make you trip. What can I do in the kitchen?  Clean up any spills right away.  Avoid walking on wet floors.  Keep items that you use a lot in easy-to-reach places.  If you need to reach something above you, use a strong step stool that has a grab bar.  Keep electrical cords out of the way.  Do not use floor polish or wax that makes floors slippery. If you must use wax, use non-skid floor wax.  Do not have throw rugs and other things on the floor that can make you trip. What can I do with my stairs?  Do not leave any items on the stairs.  Make sure that there are handrails on both sides of the stairs and use them. Fix handrails that are broken or loose. Make sure that handrails are as long as the stairways.  Check any carpeting to make sure that it is firmly attached to the stairs. Fix any carpet that is loose or worn.  Avoid having throw rugs at the top or bottom of the stairs. If  you do have throw rugs, attach them to the floor with carpet tape.  Make sure that you have a light switch at the top of the stairs and the bottom of the stairs. If you do not have them, ask someone to add them for you. What else can I do to help prevent falls?  Wear shoes that: ? Do not have high heels. ? Have rubber bottoms. ? Are comfortable and fit you well. ? Are closed at the toe. Do not wear sandals.  If you use a stepladder: ? Make sure that it is fully opened. Do not climb a closed stepladder. ? Make sure that both sides of the stepladder are locked into place. ? Ask someone to hold it for you, if possible.  Clearly mark and make sure that you can see: ? Any grab bars or handrails. ? First and last steps. ? Where the edge of each step is.  Use tools that help you move around (mobility aids) if  they are needed. These include: ? Canes. ? Walkers. ? Scooters. ? Crutches.  Turn on the lights when you go into a dark area. Replace any light bulbs as soon as they burn out.  Set up your furniture so you have a clear path. Avoid moving your furniture around.  If any of your floors are uneven, fix them.  If there are any pets around you, be aware of where they are.  Review your medicines with your doctor. Some medicines can make you feel dizzy. This can increase your chance of falling. Ask your doctor what other things that you can do to help prevent falls. This information is not intended to replace advice given to you by your health care provider. Make sure you discuss any questions you have with your health care provider. Document Released: 04/27/2009 Document Revised: 12/07/2015 Document Reviewed: 08/05/2014 Elsevier Interactive Patient Education  2018 [place] Maintenance, Male A healthy lifestyle and preventive care is important for your health and wellness. Ask your health care provider about what schedule of regular examinations is right for you. What should I know about weight and diet? Eat a Healthy Diet  Eat plenty of vegetables, fruits, whole grains, low-fat dairy products, and lean protein.  Do not eat a lot of foods high in solid fats, added sugars, or salt.  Maintain a Healthy Weight Regular exercise can help you achieve or maintain a healthy weight. You should:  Do at least 150 minutes of exercise each week. The exercise should increase your heart rate and make you sweat (moderate-intensity exercise).  Do strength-training exercises at least twice a week.  Watch Your Levels of Cholesterol and Blood Lipids  Have your blood tested for lipids and cholesterol every 5 years starting at 75 years of age. If you are at high risk for heart disease, you should start having your blood tested when you are 75 years old. You may need to have your cholesterol  levels checked more often if: ? Your lipid or cholesterol levels are high. ? You are older than 75 years of age. ? You are at high risk for heart disease.  What should I know about cancer screening? Many types of cancers can be detected early and may often be prevented. Lung Cancer  You should be screened every year for lung cancer if: ? You are a current smoker who has smoked for at least 30 years. ? You are a former smoker who has quit within the past  15 years.  Talk to your health care provider about your screening options, when you should start screening, and how often you should be screened.  Colorectal Cancer  Routine colorectal cancer screening usually begins at 74 years of age and should be repeated every 5-10 years until you are 75 years old. You may need to be screened more often if early forms of precancerous polyps or small growths are found. Your health care provider may recommend screening at an earlier age if you have risk factors for colon cancer.  Your health care provider may recommend using home test kits to check for hidden blood in the stool.  A small camera at the end of a tube can be used to examine your colon (sigmoidoscopy or colonoscopy). This checks for the earliest forms of colorectal cancer.  Prostate and Testicular Cancer  Depending on your age and overall health, your health care provider may do certain tests to screen for prostate and testicular cancer.  Talk to your health care provider about any symptoms or concerns you have about testicular or prostate cancer.  Skin Cancer  Check your skin from head to toe regularly.  Tell your health care provider about any new moles or changes in moles, especially if: ? There is a change in a mole's size, shape, or color. ? You have a mole that is larger than a pencil eraser.  Always use sunscreen. Apply sunscreen liberally and repeat throughout the day.  Protect yourself by wearing long sleeves, pants, a  wide-brimmed hat, and sunglasses when outside.  What should I know about heart disease, diabetes, and high blood pressure?  If you are 95-58 years of age, have your blood pressure checked every 3-5 years. If you are 61 years of age or older, have your blood pressure checked every year. You should have your blood pressure measured twice-once when you are at a hospital or clinic, and once when you are not at a hospital or clinic. Record the average of the two measurements. To check your blood pressure when you are not at a hospital or clinic, you can use: ? An automated blood pressure machine at a pharmacy. ? A home blood pressure monitor.  Talk to your health care provider about your target blood pressure.  If you are between 67-1 years old, ask your health care provider if you should take aspirin to prevent heart disease.  Have regular diabetes screenings by checking your fasting blood sugar level. ? If you are at a normal weight and have a low risk for diabetes, have this test once every three years after the age of 62. ? If you are overweight and have a high risk for diabetes, consider being tested at a younger age or more often.  A one-time screening for abdominal aortic aneurysm (AAA) by ultrasound is recommended for men aged 42-75 years who are current or former smokers. What should I know about preventing infection? Hepatitis B If you have a higher risk for hepatitis B, you should be screened for this virus. Talk with your health care provider to find out if you are at risk for hepatitis B infection. Hepatitis C Blood testing is recommended for:  Everyone born from [birth year] through 1964.  Anyone with known risk factors for hepatitis C.  Sexually Transmitted Diseases (STDs)  You should be screened each year for STDs including gonorrhea and chlamydia if: ? You are sexually active and are younger than 76 years of age. ? You are older than 75  years of age and your health care provider  tells you that you are at risk for this type of infection. ? Your sexual activity has changed since you were last screened and you are at an increased risk for chlamydia or gonorrhea. Ask your health care provider if you are at risk.  Talk with your health care provider about whether you are at high risk of being infected with HIV. Your health care provider may recommend a prescription medicine to help prevent HIV infection.  What else can I do?  Schedule regular health, dental, and eye exams.  Stay current with your vaccines (immunizations).  Do not use any tobacco products, such as cigarettes, chewing tobacco, and e-cigarettes. If you need help quitting, ask your health care provider.  Limit alcohol intake to no more than 2 drinks per day. One drink equals 12 ounces of beer, 5 ounces of wine, or 1 ounces of hard liquor.  Do not use street drugs.  Do not share needles.  Ask your health care provider for help if you need support or information about quitting drugs.  Tell your health care provider if you often feel depressed.  Tell your health care provider if you have ever been abused or do not feel safe at home. This information is not intended to replace advice given to you by your health care provider. Make sure you discuss any questions you have with your health care provider. Document Released: 12/28/2007 Document Revised: 02/28/2016 Document Reviewed: 04/04/2015 Elsevier Interactive Patient Education  [employer].

## 2017-03-30 NOTE — Progress Notes (Signed)
AWV reviewed and agree.  Signed:  Crissie Sickles, MD           03/30/2017

## 2017-05-04 ENCOUNTER — Other Ambulatory Visit: Payer: Self-pay | Admitting: Family Medicine

## 2017-05-12 ENCOUNTER — Other Ambulatory Visit: Payer: Self-pay | Admitting: Family Medicine

## 2017-05-12 MED ORDER — BUSPIRONE HCL 5 MG PO TABS: 5.0000 mg | ORAL_TABLET | Freq: Every day | ORAL | 1 refills | Status: DC

## 2017-05-12 NOTE — Telephone Encounter (Signed)
RF request for Buspar, never Rx'd by Dr Anitra Lauth.    Spoke to patient's wife and she states that [facility] kept sending to prior PCP in [place].  Please advise RF.

## 2017-06-10 ENCOUNTER — Telehealth: Payer: Self-pay | Admitting: *Deleted

## 2017-06-10 NOTE — Telephone Encounter (Signed)
Pt advised and voiced understanding.  He will try to find another pharmacy, if he has any trouble finding another pharmacy he will call back.

## 2017-06-10 NOTE — Telephone Encounter (Signed)
Pls ask pt to try to get this at a different pharmacy.  It is a medication that should be readily available---it may be that [facility] cannot get it from their specific supplier, and a different pharmacy may have no problems. The only med I could rx him to replace this would be a med like xanax.  Have him call back if he cannot get the buspirone at a different pharmacy.-thx

## 2017-06-10 NOTE — Telephone Encounter (Signed)
Pt came to office today to let Dr. Anitra Lauth know that [facility] is unable to fill buspirone. [facility] and he stated that none of the strengths are available.  Pt is requesting alternative. Please advise. Thanks.

## 2017-06-17 ENCOUNTER — Encounter: Payer: Self-pay | Admitting: Family Medicine

## 2017-06-17 ENCOUNTER — Other Ambulatory Visit: Payer: Self-pay

## 2017-06-17 ENCOUNTER — Ambulatory Visit (INDEPENDENT_AMBULATORY_CARE_PROVIDER_SITE_OTHER): Payer: Medicare Other | Admitting: Family Medicine

## 2017-06-17 VITALS — BP 133/77 | HR 59 | Temp 97.7°F | Resp 16 | Ht 72.0 in | Wt 207.5 lb

## 2017-06-17 DIAGNOSIS — Z23 Encounter for immunization: Secondary | ICD-10-CM | POA: Diagnosis not present

## 2017-06-17 DIAGNOSIS — G40909 Epilepsy, unspecified, not intractable, without status epilepticus: Secondary | ICD-10-CM | POA: Diagnosis not present

## 2017-06-17 DIAGNOSIS — H6123 Impacted cerumen, bilateral: Secondary | ICD-10-CM | POA: Diagnosis not present

## 2017-06-17 DIAGNOSIS — Z7901 Long term (current) use of anticoagulants: Secondary | ICD-10-CM

## 2017-06-17 DIAGNOSIS — Z79899 Other long term (current) drug therapy: Secondary | ICD-10-CM

## 2017-06-17 NOTE — Progress Notes (Signed)
OFFICE VISIT  06/17/2017   CC:  Chief Complaint  Patient presents with  . Follow-up    RCI, pt is not fasting.   HPI:    Patient is a 76 y.o. Caucasian male who presents for 6 mo f/u hypercholesterolemia, seizure d/o, hx of recurrent acute DVT.  Traveling a lot lately: went to [place] and [place] recently.  DVT plan per hem/onc is to be on low dose xarelto (10mg  qd) for at least one year--the low dose regimen started approx 03/2017.  Still not completely decided whether or not pt will need lifelong anticoagulation.  HLD: he took himself off statin after last visit (about 3 mo ago). He was having no side effects on the med.  He wanted to just get off med and see if cholest stayed ok.  Seizure d/o: no seizures since last visit.  He has kept his dose at 2 tabs qAM and 3 qPM.    ROS:  No nosebleeds, no melena, no hematochezia, no gross hematuria. No unusual bruising. Hearing impairment worse than normal recently, hx of cerumen impactions.  Past Medical History:  Diagnosis Date  . Anisocoria    R pupil larger than L  . Anxiety   . Baker's cyst of knee 03/2016   Right  . Cataract    Bilat; no surgery  . Colon polyps 11/2008 & 2017  . Diverticulosis 11/2008  . DVT of lower limb, acute ([place]) 03/19/2016   Right gastroc (xarelto started).  Full thrombophilia panel by Dr. Marin Olp 09/2016 was NORMAL.  Dr. Marin Olp recommended low dose xarelto continuation in order to decrease the risk of recurrent DVT (finishes 1 yr of anticoag 03/2017, then will start low dose xarelto).  . Glaucoma    Primary open angle glaucoma OU, mild stage [name], MD)  . History of prostate cancer   . Hyperlipidemia 11/2015  . 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 Lung nodule 10/2014   calcified granulomata  . Nephrolithiasis   . Osteopenia 2009   By DEXA  . Prostate cancer ([place])    robotic  prostectomy   . 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 Ptosis, paralytic, right   . 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  Last seizure 2007.  Marland Kitchen Skull fracture ([name])    > 30 years ago; now with seizure d/o  . 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  F/u ultrasound was NORMAL.  . 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 CARPAL TUNNEL RELEASE Right   . CARPAL TUNNEL RELEASE    . COLONOSCOPY W/ POLYPECTOMY  12/02/08; 10/12/15   Recall 3 yrs (Dr. Henrene Pastor)  . DEXA  09/02/07   Osteopenia  . INCISION AND DRAINAGE Left    knee  . LITHOTRIPSY    . ROBOT ASSISTED LAPAROSCOPIC RADICAL PROSTATECTOMY  2008  . SUBDURAL HEMATOMA EVACUATION VIA CRANIOTOMY     + temporal intracerebral hematoma--surgical drainage  . 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    Outpatient Medications Prior to Visit  Medication Sig Dispense Refill  .  busPIRone (BUSPAR) 5 MG tablet Take 1 tablet (5 mg total) by mouth daily. 90 tablet 1  . phenytoin (DILANTIN) 100 MG ER capsule Take 2 capsules (200 mg total) by mouth 2 (two) times daily. 360 capsule 1  . rivaroxaban (XARELTO) 10 MG TABS tablet Take 1 tablet (10 mg total) by mouth daily. 30 tablet 12  . timolol (TIMOPTIC) 0.5 % ophthalmic solution 1 drop 2 (two) times daily.    . travoprost, benzalkonium, (TRAVATAN) 0.004 % ophthalmic solution Place 1 drop into both eyes at bedtime.    . vitamin C (ASCORBIC ACID) 500 MG tablet Take 500 mg by mouth daily.    . rosuvastatin (CRESTOR) 5 MG tablet TAKE 1 TABLET BY MOUTH EVERY DAY (Patient not taking: Reported on 06/17/2017) 30 tablet 5   No facility-administered medications prior to visit.     Allergies  Allergen Reactions  .  Penicillins Nausea And Vomiting and Rash  . Shellfish Allergy Swelling    ROS As per HPI  PE: Blood pressure 133/77, pulse (!) 59, temperature 97.7 F (36.5 C), temperature source Oral, resp. rate 16, height 6' (1.829 m), weight 207 lb 8 oz (94.1 kg), SpO2 97 %. Wt Readings from Last 2 Encounters:  06/17/17 207 lb 8 oz (94.1 kg)  03/14/17 198 lb 1.6 oz (89.9 kg)    Gen: alert, oriented x 4, affect pleasant.  Lucid thinking and conversation noted. EARS: bilat 100% cerumen impactions noted. HEENT: PERRLA, EOMI.   Neck: no LAD, mass, or thyromegaly. CV: RRR, no m/r/g LUNGS: CTA bilat, nonlabored. NEURO: no tremor or tics noted on observation.  Coordination intact. CN 2-12 grossly intact bilaterally, strength 5/5 in all extremeties.  No ataxia. EXT: no clubbing or cyanosis.  He has 2+ pitting edema in L LL, and 3+ pitting edema in R LL.  No rash, erythema, or tenderness on either side. SKIN: some ecchymoses on both wrists/forearms, c/w anticoagulant use.  LABS:  Lab Results  Component Value Date   CHOL 177 12/23/2016   HDL 50.50 12/23/2016   LDLCALC 108 (H) 12/23/2016   TRIG 94.0 12/23/2016   CHOLHDL 4 12/23/2016     Chemistry      Component Value Date/Time   NA 144 01/03/2017 1114   K 4.5 01/03/2017 1114   CL 108 01/03/2017 1114   CO2 32 01/03/2017 1114   BUN 13 01/03/2017 1114   CREATININE 1.1 01/03/2017 1114      Component Value Date/Time   CALCIUM 9.1 01/03/2017 1114   ALKPHOS 125 (H) 01/03/2017 1114   AST 23 01/03/2017 1114   ALT 22 01/03/2017 1114   BILITOT 0.80 01/03/2017 1114     Lab Results  Component Value Date   PHENYTOIN 8.3 (L) 12/23/2016   Lab Results  Component Value Date   PSA 0.00 Repeated and verified X2. (L) 12/23/2016   PSA 0.00 Repeated and verified X2. (L) 11/16/2015    IMPRESSION AND PLAN:  1) Seizure d/o: very well controlled with dilantin at current dosing of 2 100mg  ER tabs qAM and 3 100 mg ER tabs qPM. He'll return at his  earliest convenience for dilantin trough, CBC, CMET.  2) High risk med use: dilantin and xarelto: check CBC, CMET today.  3) Hx of recurrent DVT: has completed appropriate course of full dose xarelto.  Has now been on xarelto 10mg  qd for 3 months and the plan is to continue this for 1 yr and hem/onc will make any further decision regarding longer term/lifetime anticoagulation  need at that time.  4) Cerumen impaction bilat, 100%.  Irrigated by nurse today.  Only able to clear these by about 30 % on each side. He'll use OTC ear drops qd and schedule f/u o/v for another round of ear irrigation as needed.  5) Hypercholesterolemia: he has been on diet only (self d/c'd crestor) for about 3 mo due to "just wanting to be off statin"--no adverse side effects were noted by pt.  We'll get a fasting lipid panel at next f/u in 6 mo to see how he has done on diet compared with being on crestor.  An After Visit Summary was printed and given to the patient.  FOLLOW UP: Return in about 6 months (around 12/16/2017) for routine chronic illness f/u. PLUS needs lab visit at his earliest convenience to check dilantin leve.  Signed:  Crissie Sickles, MD           06/17/2017

## 2017-06-19 ENCOUNTER — Other Ambulatory Visit: Payer: Medicare Other

## 2017-06-20 ENCOUNTER — Other Ambulatory Visit (INDEPENDENT_AMBULATORY_CARE_PROVIDER_SITE_OTHER): Payer: Medicare Other

## 2017-06-20 DIAGNOSIS — G40909 Epilepsy, unspecified, not intractable, without status epilepticus: Secondary | ICD-10-CM

## 2017-06-20 DIAGNOSIS — Z7901 Long term (current) use of anticoagulants: Secondary | ICD-10-CM | POA: Diagnosis not present

## 2017-06-20 DIAGNOSIS — Z79899 Other long term (current) drug therapy: Secondary | ICD-10-CM

## 2017-06-20 LAB — CBC WITH DIFFERENTIAL/PLATELET
Basophils Absolute: 0.1 10*3/uL (ref 0.0–0.1)
Basophils Relative: 1.1 % (ref 0.0–3.0)
EOS PCT: 4.1 % (ref 0.0–5.0)
Eosinophils Absolute: 0.2 10*3/uL (ref 0.0–0.7)
HEMATOCRIT: 44.4 % (ref 39.0–52.0)
HEMOGLOBIN: 15.1 g/dL (ref 13.0–17.0)
LYMPHS PCT: 20 % (ref 12.0–46.0)
Lymphs Abs: 1 10*3/uL (ref 0.7–4.0)
MCHC: 33.9 g/dL (ref 30.0–36.0)
MCV: 102.4 fl — AB (ref 78.0–100.0)
MONOS PCT: 10.9 % (ref 3.0–12.0)
Monocytes Absolute: 0.5 10*3/uL (ref 0.1–1.0)
Neutro Abs: 3.1 10*3/uL (ref 1.4–7.7)
Neutrophils Relative %: 63.9 % (ref 43.0–77.0)
Platelets: 219 10*3/uL (ref 150.0–400.0)
RBC: 4.34 Mil/uL (ref 4.22–5.81)
RDW: 13.4 % (ref 11.5–15.5)
WBC: 4.9 10*3/uL (ref 4.0–10.5)

## 2017-06-20 LAB — COMPREHENSIVE METABOLIC PANEL
ALBUMIN: 4.2 g/dL (ref 3.5–5.2)
ALK PHOS: 115 U/L (ref 39–117)
ALT: 10 U/L (ref 0–53)
AST: 17 U/L (ref 0–37)
BILIRUBIN TOTAL: 0.7 mg/dL (ref 0.2–1.2)
BUN: 21 mg/dL (ref 6–23)
CO2: 29 mEq/L (ref 19–32)
Calcium: 8.9 mg/dL (ref 8.4–10.5)
Chloride: 104 mEq/L (ref 96–112)
Creatinine, Ser: 0.82 mg/dL (ref 0.40–1.50)
GFR: 97.27 mL/min (ref >60.00)
Glucose, Bld: 87 mg/dL (ref 70–99)
POTASSIUM: 4.8 meq/L (ref 3.5–5.1)
Sodium: 140 mEq/L (ref 135–145)
TOTAL PROTEIN: 6.3 g/dL (ref 6.0–8.3)

## 2017-06-21 LAB — PHENYTOIN LEVEL, TOTAL: Phenytoin, Total: 18.4 mg/L (ref 10.0–20.0)

## 2017-07-06 IMAGING — US US EXTREM LOW VENOUS*R*
1 series · 13 of 24 positions shown · non-contrast
Comparison: None.

CLINICAL DATA: 75-year-old male with right lower extremity swelling
localizing to the knee. Patient has a history of both Baker's cyst
and prior superficial thrombophlebitis.



[Series 1: us extrem low venous*right* · 0.08mm/px · 13 of 54 slices shown]
[im 1/54]
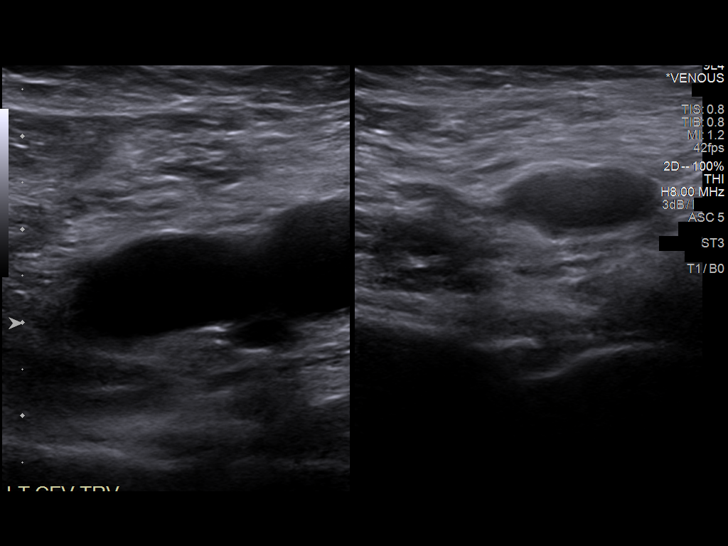
[im 5/54]
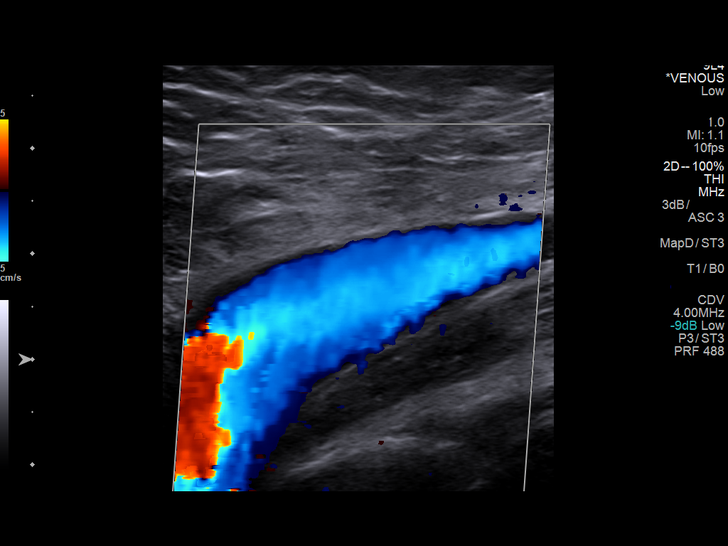
[im 10/54]
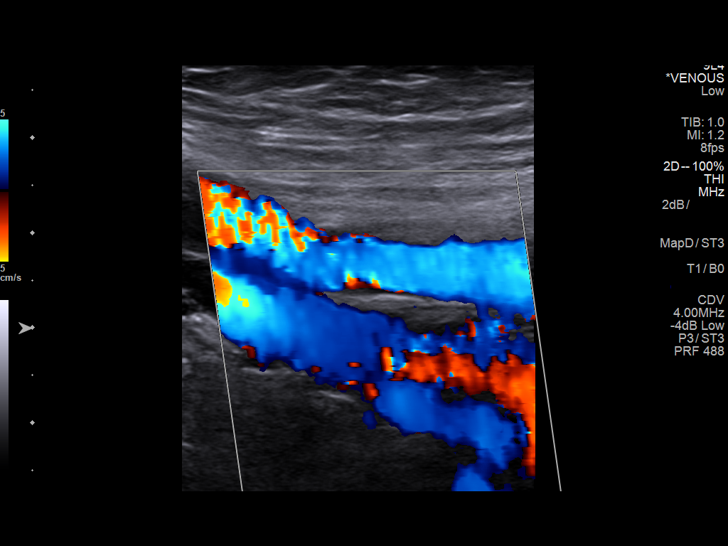
[im 14/54]
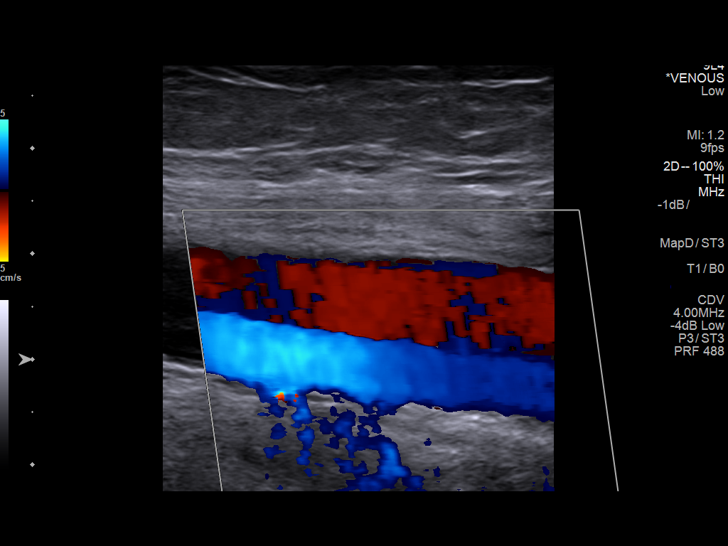
[im 19/54]
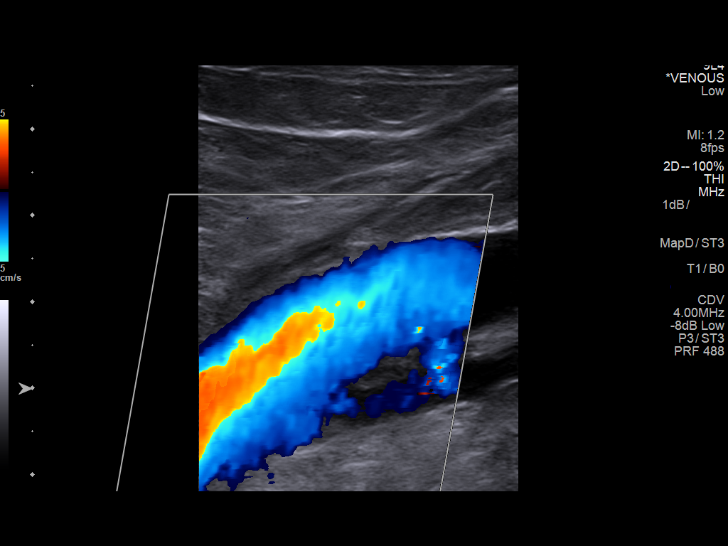
[im 24/54]
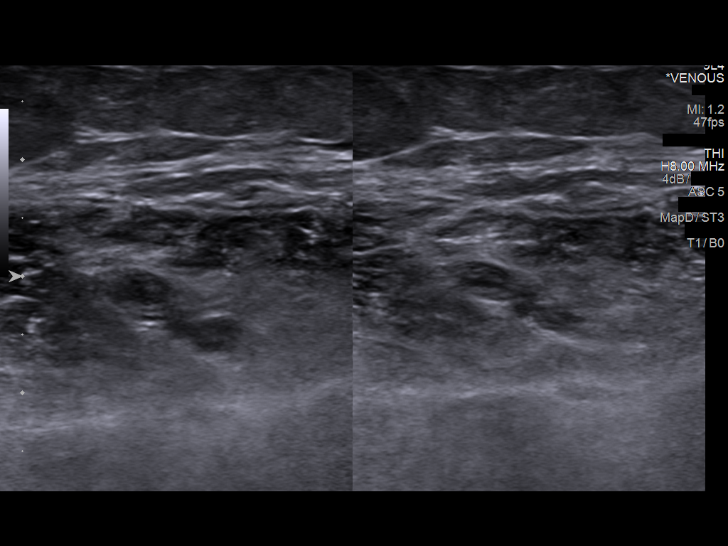
[im 28/54]
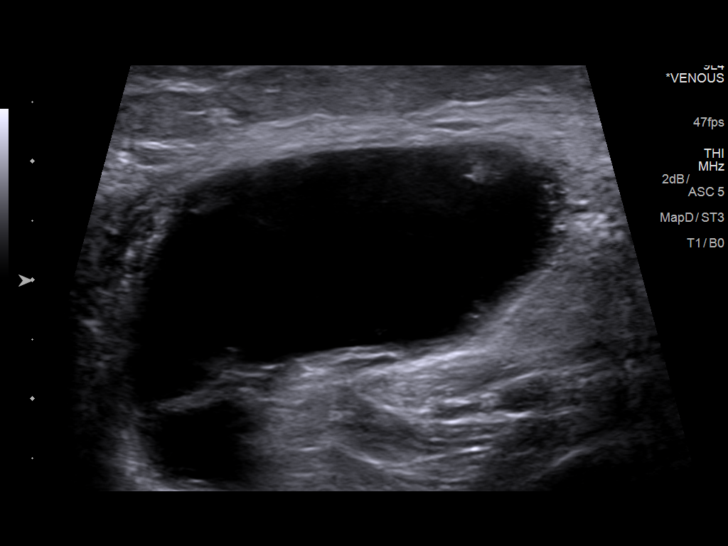
[im 30/54]
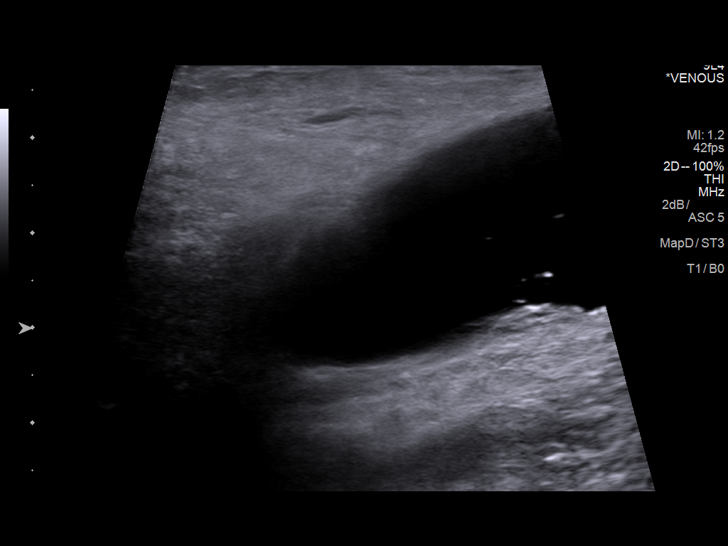
[im 35/54]
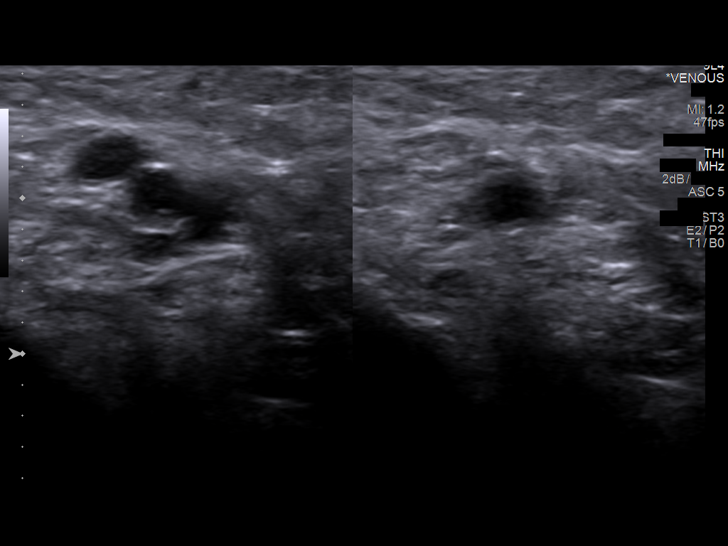
[im 40/54]
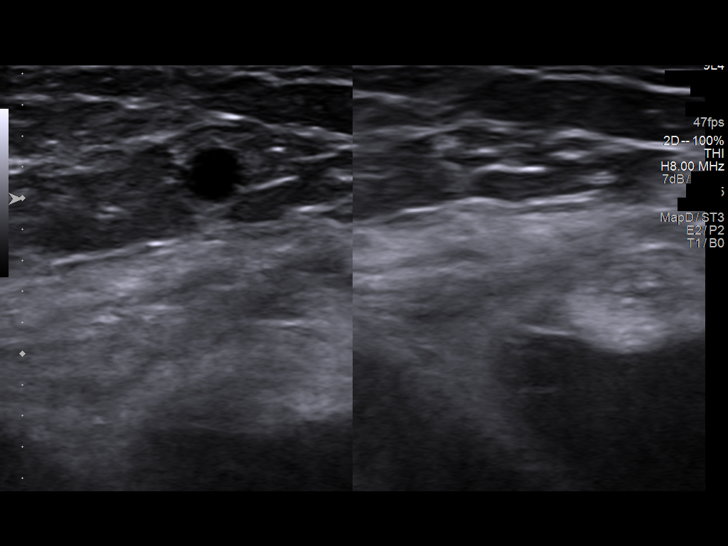
[im 44/54]
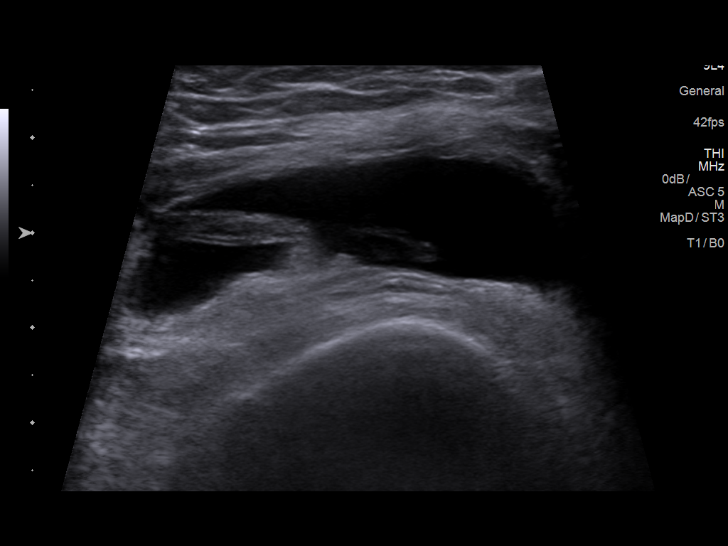
[im 49/54]
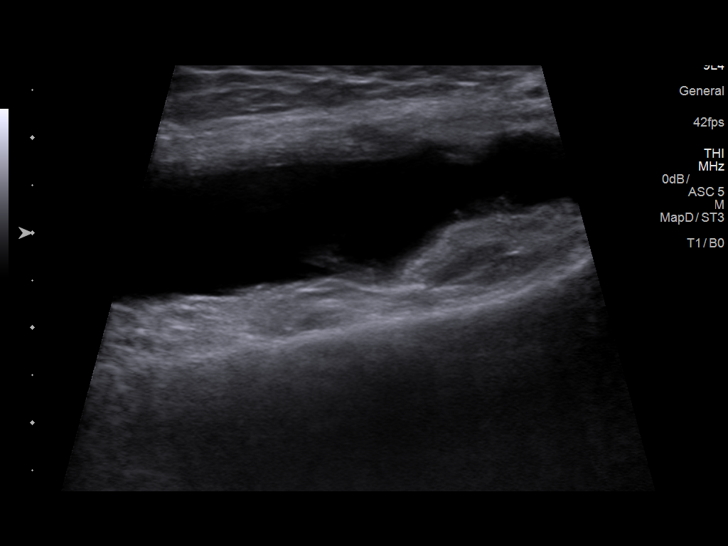
[im 54/54]
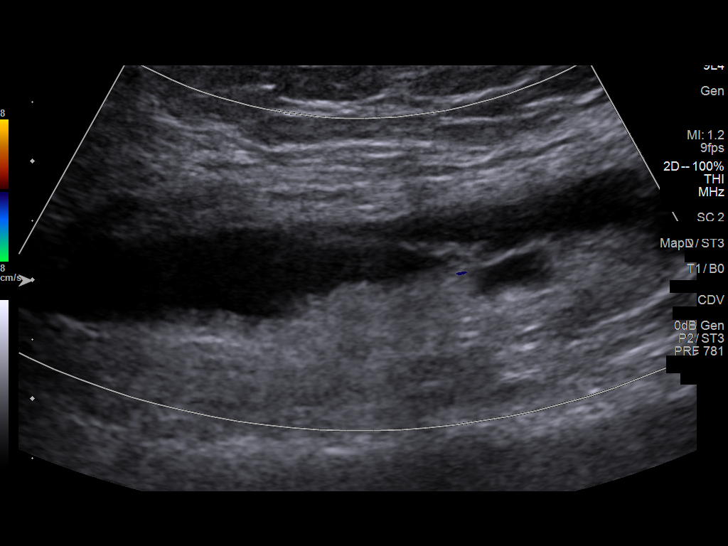

[13 of 24 positions shown; findings below may reference images not displayed]

FINDINGS: Contralateral Common Femoral Vein: Respiratory phasicity is normal
and symmetric with the symptomatic side. No evidence of thrombus.
Normal compressibility.

Common Femoral Vein: No evidence of thrombus. Normal
compressibility, respiratory phasicity and response to augmentation.

Saphenofemoral Junction: No evidence of thrombus. Normal
compressibility and flow on color Doppler imaging.

Profunda Femoral Vein: No evidence of thrombus. Normal
compressibility and flow on color Doppler imaging.

Femoral Vein: No evidence of thrombus. Normal compressibility,
respiratory phasicity and response to augmentation.

Popliteal Vein: No evidence of thrombus. Normal compressibility,
respiratory phasicity and response to augmentation.

Calf Veins: The deep gastrocnemius veins are partially compressible
and demonstrate low-level echoes within the lumen. There is evidence
of partial flow on color Doppler imaging. Findings are consistent
with isolated calf DVT. The remaining visualized calf veins are
patent.

Superficial Great Saphenous Vein: Noncompressible great saphenous
vein in the mid calf.

Venous Reflux:  None.

Other Findings: Large complex fluid collection in the medial
popliteal fossa consistent with a Baker's cyst measuring 9 x 4.3 x
2.3 cm. Suprapatellar joint effusion also noted anteriorly.
IMPRESSION: 1. Positive for isolated calf DVT in the gastrocnemius veins.
2. Positive for localized superficial thrombosis of the great
saphenous vein at the mid calf.
3. Large suprapatellar knee joint effusion with associated large
mildly complex Baker's cyst posteriorly.

## 2017-08-08 ENCOUNTER — Other Ambulatory Visit: Payer: Medicare Other

## 2017-08-08 ENCOUNTER — Ambulatory Visit: Payer: Medicare Other | Admitting: Hematology & Oncology

## 2017-08-13 DIAGNOSIS — C4441 Basal cell carcinoma of skin of scalp and neck: Secondary | ICD-10-CM | POA: Diagnosis not present

## 2017-08-13 DIAGNOSIS — Z08 Encounter for follow-up examination after completed treatment for malignant neoplasm: Secondary | ICD-10-CM | POA: Diagnosis not present

## 2017-08-13 DIAGNOSIS — Z85828 Personal history of other malignant neoplasm of skin: Secondary | ICD-10-CM | POA: Diagnosis not present

## 2017-08-13 DIAGNOSIS — L570 Actinic keratosis: Secondary | ICD-10-CM | POA: Diagnosis not present

## 2017-08-13 DIAGNOSIS — L821 Other seborrheic keratosis: Secondary | ICD-10-CM | POA: Diagnosis not present

## 2017-08-14 ENCOUNTER — Inpatient Hospital Stay (HOSPITAL_BASED_OUTPATIENT_CLINIC_OR_DEPARTMENT_OTHER): Payer: Medicare Other | Admitting: Hematology & Oncology

## 2017-08-14 ENCOUNTER — Other Ambulatory Visit: Payer: Self-pay

## 2017-08-14 ENCOUNTER — Encounter: Payer: Self-pay | Admitting: Hematology & Oncology

## 2017-08-14 ENCOUNTER — Inpatient Hospital Stay: Payer: Medicare Other | Attending: Hematology & Oncology

## 2017-08-14 VITALS — BP 147/81 | HR 74 | Temp 98.1°F | Wt 214.8 lb

## 2017-08-14 DIAGNOSIS — C61 Malignant neoplasm of prostate: Secondary | ICD-10-CM

## 2017-08-14 DIAGNOSIS — Z79899 Other long term (current) drug therapy: Secondary | ICD-10-CM | POA: Insufficient documentation

## 2017-08-14 DIAGNOSIS — Z86718 Personal history of other venous thrombosis and embolism: Secondary | ICD-10-CM | POA: Diagnosis not present

## 2017-08-14 DIAGNOSIS — I825Z1 Chronic embolism and thrombosis of unspecified deep veins of right distal lower extremity: Secondary | ICD-10-CM

## 2017-08-14 DIAGNOSIS — Z7901 Long term (current) use of anticoagulants: Secondary | ICD-10-CM

## 2017-08-14 DIAGNOSIS — Z125 Encounter for screening for malignant neoplasm of prostate: Secondary | ICD-10-CM

## 2017-08-14 LAB — CBC WITH DIFFERENTIAL (CANCER CENTER ONLY)
BASOS PCT: 1 %
Basophils Absolute: 0 10*3/uL (ref 0.0–0.1)
EOS PCT: 4 %
Eosinophils Absolute: 0.2 10*3/uL (ref 0.0–0.5)
HEMATOCRIT: 42 % (ref 38.7–49.9)
Hemoglobin: 14.5 g/dL (ref 13.0–17.1)
Lymphocytes Relative: 16 %
Lymphs Abs: 0.9 10*3/uL (ref 0.9–3.3)
MCH: 34.8 pg — ABNORMAL HIGH (ref 28.0–33.4)
MCHC: 34.5 g/dL (ref 32.0–35.9)
MCV: 100.7 fL — ABNORMAL HIGH (ref 82.0–98.0)
MONO ABS: 0.7 10*3/uL (ref 0.1–0.9)
MONOS PCT: 13 %
NEUTROS ABS: 3.6 10*3/uL (ref 1.5–6.5)
Neutrophils Relative %: 66 %
Platelet Count: 189 10*3/uL (ref 145–400)
RBC: 4.17 MIL/uL — ABNORMAL LOW (ref 4.20–5.70)
RDW: 12.9 % (ref 11.1–15.7)
WBC Count: 5.4 10*3/uL (ref 4.0–10.0)

## 2017-08-14 LAB — CMP (CANCER CENTER ONLY)
ALBUMIN: 3.6 g/dL (ref 3.5–5.0)
ALT: 16 U/L (ref 0–55)
AST: 23 U/L (ref 5–34)
Alkaline Phosphatase: 161 U/L — ABNORMAL HIGH (ref 26–84)
Anion gap: 9 (ref 5–15)
BUN: 15 mg/dL (ref 7–22)
CALCIUM: 9 mg/dL (ref 8.0–10.3)
CO2: 28 mmol/L (ref 18–33)
CREATININE: 0.9 mg/dL (ref 0.70–1.30)
Chloride: 108 mmol/L (ref 98–108)
GLUCOSE: 99 mg/dL (ref 73–118)
Potassium: 3.8 mmol/L (ref 3.3–4.7)
SODIUM: 145 mmol/L (ref 128–145)
Total Bilirubin: 0.6 mg/dL (ref 0.2–1.2)
Total Protein: 6.7 g/dL (ref 6.4–8.1)

## 2017-08-14 NOTE — Progress Notes (Signed)
Hematology and Oncology Follow Up Visit  Adam Melton [id number] [birth year] 77 y.o. 08/14/2017   Principle Diagnosis:   DVT of the right gastrocnemius vein  Current Therapy:   Xarelto 10 mg by mouth daily - complete 1 yr of maintanence            therapy in 02/2018     Interim History:  Adam Melton is back for follow-up.  We last saw him back in June 2018.  Since then, he is doing okay.  He did not go to the [employer].  He went to [place].  I think that he was not too confident about the safety issues down in [place].  In [place], he had a fantastic time.  He traveled throughout [place].  He did trips that simulated what Jesus did and walked.  He has had no problems with the 10 mg of Xarelto.  He has had no bleeding.  He has had no leg swelling.  He has had no change in bowel or bladder habits.  He has had no cough or shortness of breath.  He is on Dilantin.  He has had no issues with seizures.  He has had no rashes.  Overall, his performance status is ECOG 0.  Medications:  Current Outpatient Medications:  .  busPIRone (BUSPAR) 5 MG tablet, Take 1 tablet (5 mg total) by mouth daily., Disp: 90 tablet, Rfl: 1 .  phenytoin (DILANTIN) 100 MG ER capsule, Take 2 capsules (200 mg total) by mouth 2 (two) times daily., Disp: 360 capsule, Rfl: 1 .  rivaroxaban (XARELTO) 10 MG TABS tablet, Take 1 tablet (10 mg total) by mouth daily., Disp: 30 tablet, Rfl: 12 .  rosuvastatin (CRESTOR) 5 MG tablet, TAKE 1 TABLET BY MOUTH EVERY DAY, Disp: 30 tablet, Rfl: 5 .  timolol (TIMOPTIC) 0.5 % ophthalmic solution, 1 drop 2 (two) times daily., Disp: , Rfl:  .  travoprost, benzalkonium, (TRAVATAN) 0.004 % ophthalmic solution, Place 1 drop into both eyes at bedtime., Disp: , Rfl:  .  vitamin C (ASCORBIC ACID) 500 MG tablet, Take 500 mg by mouth daily., Disp: , Rfl:   Allergies:  Allergies  Allergen Reactions  . Penicillins Nausea And Vomiting and Rash  . Shellfish Allergy Swelling    Past  Medical History, Surgical history, Social history, and Family History were reviewed and updated.  Review of Systems: Review of Systems  Constitutional: Negative.   HENT: Negative.   Eyes: Negative.   Respiratory: Negative.   Cardiovascular: Negative.   Gastrointestinal: Negative.   Genitourinary: Negative.   Musculoskeletal: Negative.   Skin: Negative.   Neurological: Negative.   Endo/Heme/Allergies: Negative.   Psychiatric/Behavioral: Negative.      Physical Exam:  weight is 214 lb 12.8 oz (97.4 kg). His oral temperature is 98.1 F (36.7 C). His blood pressure is 147/81 (abnormal) and his pulse is 74. His oxygen saturation is 99%.   Wt Readings from Last 3 Encounters:  08/14/17 214 lb 12.8 oz (97.4 kg)  06/17/17 207 lb 8 oz (94.1 kg)  03/14/17 198 lb 1.6 oz (89.9 kg)     Physical Exam  Constitutional: He is oriented to person, place, and time.  HENT:  Head: Normocephalic and atraumatic.  Mouth/Throat: Oropharynx is clear and moist.  Eyes: EOM are normal. Pupils are equal, round, and reactive to light.  Neck: Normal range of motion.  Cardiovascular: Normal rate, regular rhythm and normal heart sounds.  Pulmonary/Chest: Effort normal and breath sounds normal.  Abdominal: Soft.  Bowel sounds are normal.  Musculoskeletal: Normal range of motion. He exhibits no edema, tenderness or deformity.  Lymphadenopathy:    He has no cervical adenopathy.  Neurological: He is alert and oriented to person, place, and time.  Skin: Skin is warm and dry. No rash noted. No erythema.  Psychiatric: He has a normal mood and affect. His behavior is normal. Judgment and thought content normal.  Vitals reviewed. Is Lab Results  Component Value Date   WBC 5.4 08/14/2017   HGB 15.1 06/20/2017   HCT 42.0 08/14/2017   MCV 100.7 (H) 08/14/2017   PLT 189 08/14/2017     Chemistry      Component Value Date/Time   NA 145 08/14/2017 1412   NA 144 01/03/2017 1114   K 3.8 08/14/2017 1412   K  4.5 01/03/2017 1114   CL 108 08/14/2017 1412   CL 108 01/03/2017 1114   CO2 28 08/14/2017 1412   CO2 32 01/03/2017 1114   BUN 15 08/14/2017 1412   BUN 13 01/03/2017 1114   CREATININE 0.82 06/20/2017 0812   CREATININE 1.1 01/03/2017 1114      Component Value Date/Time   CALCIUM 9.0 08/14/2017 1412   CALCIUM 9.1 01/03/2017 1114   ALKPHOS 161 (H) 08/14/2017 1412   ALKPHOS 125 (H) 01/03/2017 1114   AST 23 08/14/2017 1412   ALT 16 08/14/2017 1412   ALT 22 01/03/2017 1114   BILITOT 0.6 08/14/2017 1412         Impression and Plan: Adam Melton is a 77 year old white male. He developed a thrombus in the right lower leg. He has had prior thromboembolic disease. He was taken off blood thinner with Coumadin and then developed another thrombus.  He is doing well on the maintenance Xarelto.  It is a tough decision as to whether or not we should take him off Xarelto after 1 year.  My inclination is that we should keep him on low-dose Xarelto.  He does travel quite a bit.  I know that we have done an extensive thrombophilic workup which has all been negative.  We will see him back in 6 months.  May be, this will give me a little bit more clarity as to whether or not we continue or stop the Xarelto.  [name], MD 1/31/20193:04 PM

## 2017-08-19 ENCOUNTER — Encounter: Payer: Self-pay | Admitting: Family Medicine

## 2017-08-26 ENCOUNTER — Telehealth: Payer: Self-pay | Admitting: Family Medicine

## 2017-08-26 MED ORDER — PHENYTOIN SODIUM EXTENDED 100 MG PO CAPS: 200.0000 mg | ORAL_CAPSULE | Freq: Two times a day (BID) | ORAL | 1 refills | Status: DC

## 2017-08-26 NOTE — Telephone Encounter (Signed)
Refill of Dilantin  LOV     06/17/17 with Dr Anitra Lauth  [facility] 02/23/17   #360  1 refill  Last dilantin level draw   06/20/17.  [place], [place]          [phone] (Phone) [phone] (Fax)

## 2017-08-26 NOTE — Telephone Encounter (Signed)
Copied from [place] [id number]. Topic: Quick Communication - Rx Refill/Question >> Aug 26, 2017  8:23 AM Aurelio Brash B wrote: Medication: phenytoin (DILANTIN) 100 MG ER capsule Not generic -  Pt has one day of med left  Has the patient contacted their pharmacy? yes   (Agent: If no, request that the patient contact the pharmacy for the refill.)   Preferred Pharmacy (with phone number or street name):[facility] - [place], [place] - [address] [phone] (Phone) [phone] (Fax)    Agent: Please be advised that RX refills may take up to 3 business days. We ask that you follow-up with your pharmacy.

## 2017-08-26 NOTE — Telephone Encounter (Signed)
Pt came into office today asking about this refill. He stated that he is not out but will be leaving in three days and will need to p/u RF before he leaves. Please call and let him know when Rx has been sent. Thanks.

## 2017-08-27 ENCOUNTER — Other Ambulatory Visit: Payer: Self-pay

## 2017-08-27 MED ORDER — PHENYTOIN SODIUM EXTENDED 100 MG PO CAPS: ORAL_CAPSULE | ORAL | 0 refills | Status: DC

## 2017-08-27 NOTE — Telephone Encounter (Signed)
Spoke to [employer] and this has been taken care of.

## 2017-08-27 NOTE — Telephone Encounter (Signed)
Refill request sent to pharmacy.

## 2017-08-27 NOTE — Telephone Encounter (Signed)
Pharmacy needs script to be resent as [id number] to get name brand for phenytoin (Dilantin). Patient preferred name brand.

## 2017-09-03 ENCOUNTER — Encounter: Payer: Self-pay | Admitting: Family Medicine

## 2017-09-03 DIAGNOSIS — H527 Unspecified disorder of refraction: Secondary | ICD-10-CM | POA: Diagnosis not present

## 2017-09-03 DIAGNOSIS — H43812 Vitreous degeneration, left eye: Secondary | ICD-10-CM | POA: Diagnosis not present

## 2017-09-03 DIAGNOSIS — H4901 Third [oculomotor] nerve palsy, right eye: Secondary | ICD-10-CM | POA: Diagnosis not present

## 2017-09-03 DIAGNOSIS — H5053 Vertical heterophoria: Secondary | ICD-10-CM | POA: Diagnosis not present

## 2017-09-03 DIAGNOSIS — H401131 Primary open-angle glaucoma, bilateral, mild stage: Secondary | ICD-10-CM | POA: Diagnosis not present

## 2017-09-03 DIAGNOSIS — H02431 Paralytic ptosis of right eyelid: Secondary | ICD-10-CM | POA: Diagnosis not present

## 2017-09-03 DIAGNOSIS — H25813 Combined forms of age-related cataract, bilateral: Secondary | ICD-10-CM | POA: Diagnosis not present

## 2017-10-14 ENCOUNTER — Encounter: Payer: Self-pay | Admitting: Family Medicine

## 2017-10-14 ENCOUNTER — Ambulatory Visit (INDEPENDENT_AMBULATORY_CARE_PROVIDER_SITE_OTHER): Payer: Medicare Other | Admitting: Family Medicine

## 2017-10-14 VITALS — BP 135/85 | HR 70 | Temp 98.0°F | Ht 72.0 in | Wt 208.6 lb

## 2017-10-14 DIAGNOSIS — J0100 Acute maxillary sinusitis, unspecified: Secondary | ICD-10-CM

## 2017-10-14 MED ORDER — CEFDINIR 300 MG PO CAPS: 300.0000 mg | ORAL_CAPSULE | Freq: Two times a day (BID) | ORAL | 0 refills | Status: DC

## 2017-10-14 NOTE — Progress Notes (Signed)
Adam Melton , April 25, 1941, 77 y.o., male MRN: [id number] Patient Care Team    Relationship Specialty Notifications Start End  McGowen, Adrian Blackwater, MD PCP - General Family Medicine  06/22/15   Druscilla Brownie, MD Consulting Physician Dermatology  08/29/15   Latanya Maudlin, MD Consulting Physician Orthopedic Surgery  04/04/16   [name], MD Consulting Physician Ophthalmology  09/12/16   [name], MD Consulting Physician Oncology  09/27/16     Chief Complaint  Patient presents with  . Cough    Pt c/o cough that is on and off for 4 weeks now.Denies fever,sorethroat and congestion.     Subjective: Pt presents for an OV with complaints of cough  of 4 weeks duration wax/wane in productivity.  Associated symptoms include fever initially that resolved. Sinus pressure and headache present today, feeling more nasal congestion and rhinorrhea. He has not tried anything OTC. He is eating and drinking well. His grandchildren were sick last week, and after seeing them he noticed nausea and diarrhea, that has now resolved, but sinus symptoms are worsening.   Depression screen [facility] 2/9 03/14/2017 11/16/2015  Decreased Interest 0 0  Down, Depressed, Hopeless 0 0  PHQ - 2 Score 0 0    Allergies  Allergen Reactions  . Penicillins Nausea And Vomiting and Rash  . Shellfish Allergy Swelling   Social History   Tobacco Use  . Smoking status: Never Smoker  . Smokeless tobacco: Never Used  Substance Use Topics  . Alcohol use: No    Alcohol/week: 0.0 oz   Past Medical History:  Diagnosis Date  . Anisocoria    R pupil larger than L  . Anxiety   . Baker's cyst of knee 03/2016   Right  . Cataract    Bilat; no surgery  . Colon polyps 11/2008 & 2017  . Diverticulosis 11/2008  . DVT of lower limb, acute ([place]) 03/19/2016   Right gastroc (xarelto started).  Full thrombophilia panel by Dr. Marin Olp 09/2016 was NORMAL.  Dr. Marin Olp recommended low dose xarelto continuation in order to decrease  the risk of recurrent DVT (finishes 1 yr of anticoag 03/2017, then will start low dose xarelto).  . Glaucoma    Primary open angle glaucoma OU, mild stage [name], MD)  . History of prostate cancer   . Hyperlipidemia 11/2015  . 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 Lung nodule 10/2014   calcified granulomata  . Nephrolithiasis   . Osteopenia 2009   By DEXA  . 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 Ptosis, paralytic, right   . 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  Last seizure 2007.  Marland Kitchen Skull fracture ([name])    > 30 years ago; now with seizure d/o  . 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 Thrombosis of saphenous vein 2015   Left; PCP in [place] put him on xarelto x 3 mo.  F/u ultrasound was NORMAL.  . 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 CARPAL TUNNEL RELEASE Right   . CARPAL TUNNEL RELEASE    . COLONOSCOPY W/ POLYPECTOMY  12/02/08; 10/12/15   Recall 3 yrs (Dr. Henrene Pastor)  . DEXA  09/02/07  Osteopenia  . INCISION AND DRAINAGE Left    knee  . LITHOTRIPSY    . ROBOT ASSISTED LAPAROSCOPIC RADICAL PROSTATECTOMY  2008  . SUBDURAL HEMATOMA EVACUATION VIA CRANIOTOMY     + temporal intracerebral hematoma--surgical drainage  . 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   Family History  Problem Relation Age of Onset  . Stroke Mother   . Alcohol abuse Father   . Cancer Sister        Ovarian  . Heart disease Brother   . Diabetes Neg Hx   . Colon cancer Neg Hx    Allergies as of 10/14/2017      Reactions   Penicillins Nausea And Vomiting, Rash   Shellfish Allergy Swelling      Medication List         Accurate as of 10/14/17 12:50 PM. Always use your most recent med list.          busPIRone 5 MG tablet Commonly known as:  BUSPAR Take 1 tablet (5 mg total) by mouth daily.   phenytoin 100 MG ER capsule Commonly known as:  DILANTIN Take 2 capsules (200 mg total) by mouth every morning AND 3 capsules (300 mg total) at bedtime.   rivaroxaban 10 MG Tabs tablet Commonly known as:  XARELTO Take 1 tablet (10 mg total) by mouth daily.   rosuvastatin 5 MG tablet Commonly known as:  CRESTOR TAKE 1 TABLET BY MOUTH EVERY DAY   timolol 0.5 % ophthalmic solution Commonly known as:  TIMOPTIC 1 drop 2 (two) times daily.   travoprost (benzalkonium) 0.004 % ophthalmic solution Commonly known as:  TRAVATAN Place 1 drop into both eyes at bedtime.   vitamin C 500 MG tablet Commonly known as:  ASCORBIC ACID Take 500 mg by mouth daily.       All past medical history, surgical history, allergies, family history, immunizations andmedications were updated in the EMR today and reviewed under the history and medication portions of their EMR.     ROS: Negative, with the exception of above mentioned in HPI   Objective:  BP 135/85 (BP Location: Left Arm, Patient Position: Sitting, Cuff Size: Normal)   Pulse 70   Temp 98 F (36.7 C) (Oral)   Ht 6' (1.829 m)   Wt 208 lb 9.6 oz (94.6 kg)   SpO2 97%   BMI 28.29 kg/m  Body mass index is 28.29 kg/m. Gen: Afebrile. No acute distress. Nontoxic in appearance, well developed, well nourished. Very pleasant male.  HENT: AT. [place]. Bilateral TM visualized WNL. MMM, no oral lesions. Bilateral nares with erythema and drainage, mild swelling. Throat without erythema or exudates. PND, TTP Left max sinus.  Eyes:Pupils Equal Round Reactive to light, Extraocular movements intact,  Conjunctiva without redness, discharge or icterus. Neck/lymp/endocrine: Supple,no lymphadenopathy CV: RRR  Chest: CTAB, no wheeze or crackles. Good air movement, normal resp effort.    Skin: no rashes, purpura or petechiae.   No exam data present No results found. No results found for this or any previous visit (from the past 24 hour(s)).  Assessment/Plan: Adam Melton is a 77 y.o. male present for OV for  Acute non-recurrent maxillary sinusitis Rest, hydrate.  + flonase, mucinex (DM if cough), nettie pot or nasal saline.  Claritin or allegra for allergy.  omnicef prescribed, take until completed. (allergic to PCN with rash-maybe) uncertain if PCN. Discussed the possibility of rash with  omnicef <1 %. They are ok to try.  If cough present it can last up to 6-8 weeks.  F/U 2 weeks of not improved.    Reviewed expectations re: course of current medical issues.  Discussed self-management of symptoms.  Outlined signs and symptoms indicating need for more acute intervention.  Patient verbalized understanding and all questions were answered.  Patient received an After-Visit Summary.    No orders of the defined types were placed in this encounter.    Note is dictated utilizing voice recognition software. Although note has been proof read prior to signing, occasional typographical errors still can be missed. If any questions arise, please do not hesitate to call for verification.   electronically signed by:  Howard Pouch, DO  [place]

## 2017-10-14 NOTE — Patient Instructions (Signed)
Rest, hydrate.  +/- flonase, mucinex (DM if cough), nettie pot or nasal saline.  Start Claritin or allegra if able. --> this is allergy pill. If able to tolerate.  omnicef prescribed, take until completed.  If cough present it can last up to 6-8 weeks.  F/U 2 weeks of not improved.     Sinusitis, Adult Sinusitis is soreness and inflammation of your sinuses. Sinuses are hollow spaces in the bones around your face. They are located:  Around your eyes.  In the middle of your forehead.  Behind your nose.  In your cheekbones.  Your sinuses and nasal passages are lined with a stringy fluid (mucus). Mucus normally drains out of your sinuses. When your nasal tissues get inflamed or swollen, the mucus can get trapped or blocked so air cannot flow through your sinuses. This lets bacteria, viruses, and funguses grow, and that leads to infection. Follow these instructions at home: Medicines  Take, use, or apply over-the-counter and prescription medicines only as told by your doctor. These may include nasal sprays.  If you were prescribed an antibiotic medicine, take it as told by your doctor. Do not stop taking the antibiotic even if you start to feel better. Hydrate and Humidify  Drink enough water to keep your pee (urine) clear or pale yellow.  Use a cool mist humidifier to keep the humidity level in your home above 50%.  Breathe in steam for 10-15 minutes, 3-4 times a day or as told by your doctor. You can do this in the bathroom while a hot shower is running.  Try not to spend time in cool or dry air. Rest  Rest as much as possible.  Sleep with your head raised (elevated).  Make sure to get enough sleep each night. General instructions  Put a warm, moist washcloth on your face 3-4 times a day or as told by your doctor. This will help with discomfort.  Wash your hands often with soap and water. If there is no soap and water, use hand sanitizer.  Do not smoke. Avoid being  around people who are smoking (secondhand smoke).  Keep all follow-up visits as told by your doctor. This is important. Contact a doctor if:  You have a fever.  Your symptoms get worse.  Your symptoms do not get better within 10 days. Get help right away if:  You have a very bad headache.  You cannot stop throwing up (vomiting).  You have pain or swelling around your face or eyes.  You have trouble seeing.  You feel confused.  Your neck is stiff.  You have trouble breathing. This information is not intended to replace advice given to you by your health care provider. Make sure you discuss any questions you have with your health care provider. Document Released: 12/18/2007 Document Revised: 02/25/2016 Document Reviewed: 04/26/2015 Elsevier Interactive Patient Education  [employer].

## 2017-10-24 ENCOUNTER — Other Ambulatory Visit: Payer: Self-pay | Admitting: *Deleted

## 2017-10-24 DIAGNOSIS — I824Z1 Acute embolism and thrombosis of unspecified deep veins of right distal lower extremity: Secondary | ICD-10-CM

## 2017-10-24 MED ORDER — RIVAROXABAN 10 MG PO TABS: 10.0000 mg | ORAL_TABLET | Freq: Every day | ORAL | 12 refills | Status: DC

## 2017-11-19 ENCOUNTER — Other Ambulatory Visit: Payer: Self-pay | Admitting: Family Medicine

## 2017-11-19 NOTE — Telephone Encounter (Signed)
[facility]  RF request for buspirone LOV: 06/17/17 Next ov: 12/17/17 Last written: 05/12/17 #90 w/ 1RF  RF request for dilantin Last written: 08/27/17 #450 w/ 0Rf  Please advise. Thanks.

## 2017-12-17 ENCOUNTER — Encounter: Payer: Self-pay | Admitting: Family Medicine

## 2017-12-17 ENCOUNTER — Ambulatory Visit (INDEPENDENT_AMBULATORY_CARE_PROVIDER_SITE_OTHER): Payer: Medicare Other | Admitting: Family Medicine

## 2017-12-17 VITALS — BP 136/86 | HR 59 | Temp 97.6°F | Resp 16 | Ht 72.0 in | Wt 207.4 lb

## 2017-12-17 DIAGNOSIS — G40909 Epilepsy, unspecified, not intractable, without status epilepticus: Secondary | ICD-10-CM

## 2017-12-17 DIAGNOSIS — E7800 Pure hypercholesterolemia, unspecified: Secondary | ICD-10-CM

## 2017-12-17 DIAGNOSIS — E663 Overweight: Secondary | ICD-10-CM | POA: Diagnosis not present

## 2017-12-17 DIAGNOSIS — I82409 Acute embolism and thrombosis of unspecified deep veins of unspecified lower extremity: Secondary | ICD-10-CM | POA: Diagnosis not present

## 2017-12-17 DIAGNOSIS — Z79899 Other long term (current) drug therapy: Secondary | ICD-10-CM

## 2017-12-17 DIAGNOSIS — Z8546 Personal history of malignant neoplasm of prostate: Secondary | ICD-10-CM | POA: Diagnosis not present

## 2017-12-17 NOTE — Progress Notes (Signed)
OFFICE VISIT  12/17/2017   CC:  Chief Complaint  Patient presents with  . Follow-up    RCI, pt is not fasting.     HPI:    Patient is a 77 y.o. 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 As of last hem f/u the plan was to continue low dose xarelto with re-eval to determine need for lifetime need of this med when he sees Dr. Marin Olp 02/11/18. No signs of bleeding.   Hx of prostate ca, s/p prostatectomy, due for PSA surveillance.  He self d/c'd his statin approx 9 mo ago to see how could do off medication--mild statin side effects.  Has not had recheck lipid panel yet. He has been resuming a vegetarian diet, has had good cholesterol on this in the past.  ROS: no fevers, no palpitations, no SOB, no CP, no hematuria, no hematochezia or melena or nosebleeds.  Past Medical History:  Diagnosis Date  . Anisocoria    R pupil larger than L  . Anxiety   . Baker's cyst of knee 03/2016   Right  . Cataract    Bilat; no surgery  . Colon polyps 11/2008 & 2017  . Diverticulosis 11/2008  . DVT of lower limb, acute ([place]) 03/19/2016   Right gastroc (xarelto started).  Full thrombophilia panel by Dr. Marin Olp 09/2016 was NORMAL.  Dr. Marin Olp recommended low dose xarelto continuation in order to decrease the risk of recurrent DVT (finishes 1 yr of anticoag 03/2017, then will start low dose xarelto).  . Glaucoma    Primary open angle glaucoma OU, mild stage [name], MD)-stable as of 09/2017 ophth f/u.  Marland Kitchen History of prostate cancer   . Hyperlipidemia 11/2015  . 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 Lung nodule 10/2014   calcified granulomata  . Nephrolithiasis   . Osteopenia 2009   By DEXA  . 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 Ptosis, paralytic, right   . 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  Last seizure 2007.  Marland Kitchen Skull fracture ([name])    > 30 years ago; now with seizure d/o  . 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  F/u ultrasound was NORMAL.  . 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 CARPAL TUNNEL RELEASE Right   . CARPAL TUNNEL RELEASE    . COLONOSCOPY W/ POLYPECTOMY  12/02/08; 10/12/15   Recall 3 yrs (Dr. Henrene Pastor)  . DEXA  09/02/07   Osteopenia  . INCISION AND DRAINAGE Left    knee  . LITHOTRIPSY    . ROBOT ASSISTED LAPAROSCOPIC RADICAL PROSTATECTOMY  2008  . SUBDURAL HEMATOMA EVACUATION VIA CRANIOTOMY     + temporal intracerebral hematoma--surgical drainage  . 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    Outpatient Medications Prior to Visit  Medication Sig Dispense Refill  . busPIRone (BUSPAR) 5 MG tablet TAKE 1 TABLET DAILY 90 tablet 1  .  DILANTIN 100 MG ER capsule Take 2 capsules (200 mg total) by mouth every morning AND 3 capsules (300 mg total) at bedtime. 450 capsule 0  . rivaroxaban (XARELTO) 10 MG TABS tablet Take 1 tablet (10 mg total) by mouth daily. 30 tablet 12  . timolol (TIMOPTIC) 0.5 % ophthalmic solution 1 drop 2 (two) times daily.    . travoprost, benzalkonium, (TRAVATAN) 0.004 % ophthalmic solution Place 1 drop into both eyes at bedtime.    . cefdinir (OMNICEF) 300 MG capsule Take 1 capsule (300 mg total) by mouth 2 (two) times daily. (Patient not taking: Reported on 12/17/2017) 20 capsule 0  . rosuvastatin (CRESTOR) 5 MG tablet TAKE 1 TABLET BY MOUTH EVERY DAY (Patient not taking: Reported on 10/14/2017) 30 tablet 5  . vitamin C (ASCORBIC ACID) 500 MG tablet Take 500 mg by mouth daily.     No  facility-administered medications prior to visit.     Allergies  Allergen Reactions  . Azithromycin Other (See Comments)    seizure  . Penicillins Nausea And Vomiting and Rash  . Shellfish Allergy Swelling    ROS As per HPI  PE: Blood pressure 136/86, pulse (!) 59, temperature 97.6 F (36.4 C), temperature source Oral, resp. rate 16, height 6' (1.829 m), weight 207 lb 6 oz (94.1 kg), SpO2 97 %. Body mass index is 28.13 kg/m.  Gen: Alert, well appearing.  Patient is oriented to person, place, time, and situation. AFFECT: pleasant, lucid thought and speech. No further exam today.  LABS:  Lab Results  Component Value Date   TSH 2.12 11/16/2015   Lab Results  Component Value Date   WBC 5.4 08/14/2017   HGB 14.5 08/14/2017   HCT 42.0 08/14/2017   MCV 100.7 (H) 08/14/2017   PLT 189 08/14/2017   Lab Results  Component Value Date   CREATININE 0.90 08/14/2017   BUN 15 08/14/2017   NA 145 08/14/2017   K 3.8 08/14/2017   CL 108 08/14/2017   CO2 28 08/14/2017   Lab Results  Component Value Date   ALT 16 08/14/2017   AST 23 08/14/2017   ALKPHOS 161 (H) 08/14/2017   BILITOT 0.6 08/14/2017   Lab Results  Component Value Date   CHOL 177 12/23/2016   Lab Results  Component Value Date   HDL 50.50 12/23/2016   Lab Results  Component Value Date   LDLCALC 108 (H) 12/23/2016   Lab Results  Component Value Date   TRIG 94.0 12/23/2016   Lab Results  Component Value Date   CHOLHDL 4 12/23/2016   Lab Results  Component Value Date   PSA 0.00 Repeated and verified X2. (L) 12/23/2016   PSA 0.00 Repeated and verified X2. (L) 11/16/2015      IMPRESSION AND PLAN:  1) Seizure d/o:  Stable. No changes. Dilantin level when returns for fasting labs later this week. High risk med use (dilantin and xarelto): CBC, CMET.  2) Hypercholesterolemia: off statin for < 47yr, now a vegetarian. Return for FLP.  3) Hx of recurrent DVT: pt working with hematology on plan of  action regarding question of anticoagulation indefinitely or not.  Continue xarelto for now.  4) hx of prostate cancer: due for surveillance PSA.  5) Preventative health: colon ca screening--due for repeat colonoscopy 09/2018.  An After Visit Summary was printed and given to the patient.  FOLLOW UP: Return for AWV with Kim after 03/14/18.  F/u with me 6 mo--chronic illness f/u..  SignedAbbe Amsterdam  Jillene Wehrenberg, MD           12/17/2017

## 2017-12-18 ENCOUNTER — Other Ambulatory Visit (INDEPENDENT_AMBULATORY_CARE_PROVIDER_SITE_OTHER): Payer: Medicare Other

## 2017-12-18 DIAGNOSIS — G40909 Epilepsy, unspecified, not intractable, without status epilepticus: Secondary | ICD-10-CM

## 2017-12-18 DIAGNOSIS — Z79899 Other long term (current) drug therapy: Secondary | ICD-10-CM

## 2017-12-18 DIAGNOSIS — E7800 Pure hypercholesterolemia, unspecified: Secondary | ICD-10-CM

## 2017-12-18 DIAGNOSIS — Z8546 Personal history of malignant neoplasm of prostate: Secondary | ICD-10-CM | POA: Diagnosis not present

## 2017-12-18 LAB — COMPREHENSIVE METABOLIC PANEL
ALBUMIN: 4.3 g/dL (ref 3.5–5.2)
ALK PHOS: 116 U/L (ref 39–117)
ALT: 11 U/L (ref 0–53)
AST: 17 U/L (ref 0–37)
BILIRUBIN TOTAL: 0.7 mg/dL (ref 0.2–1.2)
BUN: 15 mg/dL (ref 6–23)
CALCIUM: 9.2 mg/dL (ref 8.4–10.5)
CO2: 29 mEq/L (ref 19–32)
Chloride: 103 mEq/L (ref 96–112)
Creatinine, Ser: 0.88 mg/dL (ref 0.40–1.50)
GFR: 89.54 mL/min (ref >60.00)
Glucose, Bld: 94 mg/dL (ref 70–99)
POTASSIUM: 4.8 meq/L (ref 3.5–5.1)
Sodium: 139 mEq/L (ref 135–145)
TOTAL PROTEIN: 6.4 g/dL (ref 6.0–8.3)

## 2017-12-18 LAB — CBC WITH DIFFERENTIAL/PLATELET
Basophils Absolute: 0.1 10*3/uL (ref 0.0–0.1)
Basophils Relative: 1.3 % (ref 0.0–3.0)
EOS PCT: 2.9 % (ref 0.0–5.0)
Eosinophils Absolute: 0.1 10*3/uL (ref 0.0–0.7)
HCT: 44.6 % (ref 39.0–52.0)
HEMOGLOBIN: 15.3 g/dL (ref 13.0–17.0)
LYMPHS ABS: 0.9 10*3/uL (ref 0.7–4.0)
LYMPHS PCT: 18.5 % (ref 12.0–46.0)
MCHC: 34.4 g/dL (ref 30.0–36.0)
MCV: 98.8 fl (ref 78.0–100.0)
MONOS PCT: 9.2 % (ref 3.0–12.0)
Monocytes Absolute: 0.4 10*3/uL (ref 0.1–1.0)
NEUTROS PCT: 68.1 % (ref 43.0–77.0)
Neutro Abs: 3.3 10*3/uL (ref 1.4–7.7)
Platelets: 213 10*3/uL (ref 150.0–400.0)
RBC: 4.51 Mil/uL (ref 4.22–5.81)
RDW: 14 % (ref 11.5–15.5)
WBC: 4.8 10*3/uL (ref 4.0–10.5)

## 2017-12-18 LAB — LIPID PANEL
Cholesterol: 232 mg/dL — ABNORMAL HIGH (ref 0–200)
HDL: 47.3 mg/dL (ref >39.00)
LDL Cholesterol: 166 mg/dL — ABNORMAL HIGH (ref 0–99)
NONHDL: 185.07
TRIGLYCERIDES: 94 mg/dL (ref 0.0–149.0)
Total CHOL/HDL Ratio: 5
VLDL: 18.8 mg/dL (ref 0.0–40.0)

## 2017-12-18 LAB — PSA: PSA: 0.01 ng/mL — AB (ref 0.10–4.00)

## 2017-12-19 ENCOUNTER — Telehealth: Payer: Self-pay

## 2017-12-19 LAB — PHENYTOIN LEVEL, TOTAL: Phenytoin, Total: 18.6 mg/L (ref 10.0–20.0)

## 2017-12-19 NOTE — Telephone Encounter (Signed)
Noted  

## 2017-12-19 NOTE — Telephone Encounter (Signed)
Patients wife in for AWV today. Wife is concerned about patients memory and personality changes. She has recognized an increase in his memory loss and he is easily argumentative/short tempered which is a change for him. Wife wants PCP aware since he does not have wife attend office visits. Advised if issues seem to get worse or greater concerned, to call for appt with PCP. Pt is scheduled for AWV in 03/2018 and f/u with PCP in 06/2018.

## 2017-12-31 ENCOUNTER — Other Ambulatory Visit: Payer: Self-pay | Admitting: Family Medicine

## 2017-12-31 DIAGNOSIS — R05 Cough: Secondary | ICD-10-CM | POA: Diagnosis not present

## 2017-12-31 MED ORDER — BUSPIRONE HCL 5 MG PO TABS: 5.0000 mg | ORAL_TABLET | Freq: Every day | ORAL | 1 refills | Status: DC

## 2017-12-31 NOTE — Telephone Encounter (Signed)
Patient requesting refill of busPIRone (BUSPAR) 5 MG tablet  Please send to [facility].  Patient states [facility] has closed and they are needing this rx transferred to new pharmacy.

## 2017-12-31 NOTE — Telephone Encounter (Signed)
RF request for buspar LOV: 12/17/17 Next ov: 06/17/18 Last written: Rx was sent on 11/19/17 to [facility] which is now closed.  Request is to have Rx sent to [facility]  Please advise. Thanks.

## 2018-01-06 ENCOUNTER — Telehealth: Payer: Self-pay | Admitting: *Deleted

## 2018-01-06 NOTE — Telephone Encounter (Signed)
Pt came into the office today requesting an appointment with Dr. Anitra Lauth. Pt was asked to fill out a Triage/Walk-IN form.  Form was brought to me for review.   Pts words: "Would like a skin prick test to check for mold in our apartment. I have had a coughing problem. Also in right lower abdomen I have some pain when I get up, started after severe coughing bouts began."  Per Derinda Late pt stated that he went to [facility] to see if they would do the mold testing, he was advised to see his PCP. Pt did not mention cough or abdominal pain when at front desk.   Pt was advised to schedule an apt to be evaluated. No 30 min apts were available today pt was scheduled for 01/08/18 at 9:30am.   Provider was advised and agreed with plan.  Form was sent to scan.

## 2018-01-08 ENCOUNTER — Encounter: Payer: Self-pay | Admitting: Family Medicine

## 2018-01-08 ENCOUNTER — Ambulatory Visit (INDEPENDENT_AMBULATORY_CARE_PROVIDER_SITE_OTHER): Payer: Medicare Other | Admitting: Family Medicine

## 2018-01-08 VITALS — BP 110/65 | HR 65 | Temp 98.2°F | Resp 16 | Ht 72.0 in | Wt 206.1 lb

## 2018-01-08 DIAGNOSIS — J069 Acute upper respiratory infection, unspecified: Secondary | ICD-10-CM | POA: Diagnosis not present

## 2018-01-08 DIAGNOSIS — S39011A Strain of muscle, fascia and tendon of abdomen, initial encounter: Secondary | ICD-10-CM

## 2018-01-08 DIAGNOSIS — J40 Bronchitis, not specified as acute or chronic: Secondary | ICD-10-CM | POA: Diagnosis not present

## 2018-01-08 DIAGNOSIS — J329 Chronic sinusitis, unspecified: Secondary | ICD-10-CM

## 2018-01-08 NOTE — Patient Instructions (Signed)
Apply ice to right lower abdominal area where you have pain: 20 minutes at least once a day.  If you end up deciding that you want an allergist referral, just call my office and request this.

## 2018-01-08 NOTE — Progress Notes (Signed)
OFFICE VISIT  01/08/2018   CC:  Chief Complaint  Patient presents with  . Mold    ? concerned there is mold in his home and wants to have skin test done  . Abdominal Pain    RLQ started after coughing spell   HPI:    Patient is a 77 y.o. Caucasian male who presents accompanied by his wife for 2 concerns:   He says he has had 4 resp illnesses since 07/2017, wife with same thing. They have requested someone check their apartment for mold. The woman who lived there before them had similar issues.    They are asking if they should be tested for mold.   Recent bad coughing spell about 1 week ago, then afterwards he felt pain intermittently in R groin. Long periods of not feeling it, then when he sits up out of chair if comes.  Once he is up it goes away in a few minutes. It was also bothering him some WHEN he had most recent cough illness. Most recent resp illness has resolved--prednisone from UC.  Just some PND still.  Past Medical History:  Diagnosis Date  . Anisocoria    R pupil larger than L  . Anxiety   . Baker's cyst of knee 03/2016   Right  . Cataract    Bilat; no surgery  . Colon polyps 11/2008 & 2017  . Diverticulosis 11/2008  . DVT of lower limb, acute ([place]) 03/19/2016   Right gastroc (xarelto started).  Full thrombophilia panel by Dr. Marin Olp 09/2016 was NORMAL.  Dr. Marin Olp recommended low dose xarelto continuation in order to decrease the risk of recurrent DVT (finishes 1 yr of anticoag 03/2017, then will start low dose xarelto).  . Glaucoma    Primary open angle glaucoma OU, mild stage [name], MD)-stable as of 09/2017 ophth f/u.  Marland Kitchen History of prostate cancer   . Hyperlipidemia 11/2015  . 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 Lung nodule 10/2014   calcified granulomata  . Nephrolithiasis   . Osteopenia 2009   By DEXA  . Prostate cancer ([place])    robotic prostectomy   . 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 Ptosis, paralytic, right   . 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  Last seizure 2007.  Marland Kitchen Skull fracture ([name])    > 30 years ago; now with seizure d/o  . 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  F/u ultrasound was NORMAL.  . 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 CARPAL TUNNEL RELEASE Right   . CARPAL TUNNEL RELEASE    . COLONOSCOPY W/ POLYPECTOMY  12/02/08; 10/12/15   Recall 3 yrs (Dr. Henrene Pastor)  . DEXA  09/02/07   Osteopenia  . INCISION AND DRAINAGE Left    knee  . LITHOTRIPSY    . ROBOT ASSISTED LAPAROSCOPIC RADICAL PROSTATECTOMY  2008  . SUBDURAL HEMATOMA EVACUATION VIA CRANIOTOMY     + temporal intracerebral hematoma--surgical drainage  . 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    Outpatient Medications Prior  to Visit  Medication Sig Dispense Refill  . busPIRone (BUSPAR) 5 MG tablet Take 1 tablet (5 mg total) by mouth daily. 90 tablet 1  . DILANTIN 100 MG ER capsule Take 2 capsules (200 mg total) by mouth every morning AND 3 capsules (300 mg total) at bedtime. 450 capsule 0  . latanoprost (XALATAN) 0.005 % ophthalmic solution instill one drop in [facility] eye AT BEDTIME  5  . rivaroxaban (XARELTO) 10 MG TABS tablet Take 1 tablet (10 mg total) by mouth daily. 30 tablet 12  . timolol (TIMOPTIC) 0.5 % ophthalmic solution 1 drop 2 (two) times daily.    . travoprost, benzalkonium, (TRAVATAN) 0.004 % ophthalmic solution Place 1 drop into both eyes at bedtime.     No facility-administered medications prior to visit.     Allergies  Allergen Reactions  . Azithromycin Other (See Comments)    seizure  . Penicillins Nausea And  Vomiting and Rash  . Shellfish Allergy Swelling    ROS As per HPI  PE: Blood pressure 110/65, pulse 65, temperature 98.2 F (36.8 C), temperature source Oral, resp. rate 16, height 6' (1.829 m), weight 206 lb 2 oz (93.5 kg), SpO2 97 %. Gen: Alert, well appearing.  Patient is oriented to person, place, time, and situation. AFFECT: pleasant, lucid thought and speech. Right lower abd wall with mild TTP in a region about the size of a golf ball, without mass.  No guarding or rebound. Pain gets worse when I palpate the area and have him to an abdominal crunch. R leg extension at the hip causes worsening of the pain, ext range signif diminished due to pain.  No groin mass/hernia/tenderness.  LABS:    Chemistry      Component Value Date/Time   NA 139 12/18/2017 0814   NA 144 01/03/2017 1114   K 4.8 12/18/2017 0814   K 4.5 01/03/2017 1114   CL 103 12/18/2017 0814   CL 108 01/03/2017 1114   CO2 29 12/18/2017 0814   CO2 32 01/03/2017 1114   BUN 15 12/18/2017 0814   BUN 13 01/03/2017 1114   CREATININE 0.88 12/18/2017 0814   CREATININE 0.90 08/14/2017 1412   CREATININE 1.1 01/03/2017 1114      Component Value Date/Time   CALCIUM 9.2 12/18/2017 0814   CALCIUM 9.1 01/03/2017 1114   ALKPHOS 116 12/18/2017 0814   ALKPHOS 125 (H) 01/03/2017 1114   AST 17 12/18/2017 0814   AST 23 08/14/2017 1412   ALT 11 12/18/2017 0814   ALT 16 08/14/2017 1412   ALT 22 01/03/2017 1114   BILITOT 0.7 12/18/2017 0814   BILITOT 0.6 08/14/2017 1412     Lab Results  Component Value Date   WBC 4.8 12/18/2017   HGB 15.3 12/18/2017   HCT 44.6 12/18/2017   MCV 98.8 12/18/2017   PLT 213.0 12/18/2017   Lab Results  Component Value Date   PHENYTOIN 8.3 (L) 12/23/2016    IMPRESSION AND PLAN:  1) Recurrent URI with cough/bronchitis this year, question of mold in apartment. They are going to get the air quality and AC ducts checked in apt.  If this testing is equivocal or positive for mold then I'll  refer to allergist for testing.  2) Right lower abd wall muscle strain: secondary to forceful coughing. Reassured.  Discussed heat application and stretching.  An After Visit Summary was printed and given to the patient.  FOLLOW UP: Return for as needed.  Signed:  Crissie Sickles, MD  01/08/2018     

## 2018-02-11 ENCOUNTER — Inpatient Hospital Stay: Payer: Medicare Other | Attending: Hematology & Oncology | Admitting: Hematology & Oncology

## 2018-02-11 ENCOUNTER — Other Ambulatory Visit: Payer: Self-pay

## 2018-02-11 ENCOUNTER — Inpatient Hospital Stay: Payer: Medicare Other

## 2018-02-11 VITALS — BP 145/82 | HR 63 | Temp 97.7°F | Resp 20 | Wt 207.0 lb

## 2018-02-11 DIAGNOSIS — Z7901 Long term (current) use of anticoagulants: Secondary | ICD-10-CM

## 2018-02-11 DIAGNOSIS — Z125 Encounter for screening for malignant neoplasm of prostate: Secondary | ICD-10-CM

## 2018-02-11 DIAGNOSIS — I825Z1 Chronic embolism and thrombosis of unspecified deep veins of right distal lower extremity: Secondary | ICD-10-CM

## 2018-02-11 DIAGNOSIS — C61 Malignant neoplasm of prostate: Secondary | ICD-10-CM

## 2018-02-11 LAB — CMP (CANCER CENTER ONLY)
ALBUMIN: 4 g/dL (ref 3.5–5.0)
ALK PHOS: 125 U/L (ref 38–126)
ALT: 12 U/L (ref 0–44)
ANION GAP: 9 (ref 5–15)
AST: 17 U/L (ref 15–41)
BILIRUBIN TOTAL: 0.5 mg/dL (ref 0.3–1.2)
BUN: 20 mg/dL (ref 8–23)
CALCIUM: 8.9 mg/dL (ref 8.9–10.3)
CO2: 26 mmol/L (ref 22–32)
CREATININE: 0.98 mg/dL (ref 0.61–1.24)
Chloride: 105 mmol/L (ref 98–111)
GFR, Est AFR Am: >60 mL/min (ref >60)
GFR, Estimated: >60 mL/min (ref >60)
GLUCOSE: 127 mg/dL — AB (ref 70–99)
Potassium: 4.6 mmol/L (ref 3.5–5.1)
SODIUM: 140 mmol/L (ref 135–145)
TOTAL PROTEIN: 6.6 g/dL (ref 6.5–8.1)

## 2018-02-11 LAB — CBC WITH DIFFERENTIAL (CANCER CENTER ONLY)
Basophils Absolute: 0 10*3/uL (ref 0.0–0.1)
Basophils Relative: 0 %
EOS ABS: 0.2 10*3/uL (ref 0.0–0.5)
EOS PCT: 4 %
HCT: 42.7 % (ref 38.7–49.9)
Hemoglobin: 14.7 g/dL (ref 13.0–17.1)
LYMPHS ABS: 0.9 10*3/uL (ref 0.9–3.3)
Lymphocytes Relative: 17 %
MCH: 34.6 pg — AB (ref 28.0–33.4)
MCHC: 34.4 g/dL (ref 32.0–35.9)
MCV: 100.5 fL — ABNORMAL HIGH (ref 82.0–98.0)
MONOS PCT: 8 %
Monocytes Absolute: 0.4 10*3/uL (ref 0.1–0.9)
Neutro Abs: 3.6 10*3/uL (ref 1.5–6.5)
Neutrophils Relative %: 71 %
Platelet Count: 182 10*3/uL (ref 145–400)
RBC: 4.25 MIL/uL (ref 4.20–5.70)
RDW: 13.4 % (ref 11.1–15.7)
WBC Count: 5.1 10*3/uL (ref 4.0–10.0)

## 2018-02-11 NOTE — Progress Notes (Signed)
Hematology and Oncology Follow Up Visit  Adam Melton [id number] 1941-06-02 77 y.o. 02/11/2018   Principle Diagnosis:   DVT of the right gastrocnemius vein  Current Therapy:   Xarelto 10 mg by mouth daily - completed 1 yr of maintanence  therapy in 02/2018     Interim History:  Adam Melton is back for follow-up.  We last saw him back in June 2018.  He is doing okay.  He complains of some pain and swelling down in the left lower leg.  He does have some varicose veins in that leg.  Since his blood clot was in the right leg, I cannot imagine what this might be.  He may have a little bit of phlebitis.  We will go ahead and get him set up with Dopplers of his lower legs.  He will finish his 1 year of maintenance Xarelto in August.  He says he has about 2-3 weeks left of medicine.  After he finishes the Xarelto, I would not put him on any low-dose aspirin.    He has had no rashes.  Overall, his performance status is ECOG 0.  Medications:  Current Outpatient Medications:  .  busPIRone (BUSPAR) 5 MG tablet, Take 1 tablet (5 mg total) by mouth daily., Disp: 90 tablet, Rfl: 1 .  DILANTIN 100 MG ER capsule, Take 2 capsules (200 mg total) by mouth every morning AND 3 capsules (300 mg total) at bedtime., Disp: 450 capsule, Rfl: 0 .  latanoprost (XALATAN) 0.005 % ophthalmic solution, instill one drop in [facility] eye AT BEDTIME, Disp: , Rfl: 5 .  rivaroxaban (XARELTO) 10 MG TABS tablet, Take 1 tablet (10 mg total) by mouth daily., Disp: 30 tablet, Rfl: 12 .  timolol (TIMOPTIC) 0.5 % ophthalmic solution, 1 drop 2 (two) times daily., Disp: , Rfl:   Allergies:  Allergies  Allergen Reactions  . Azithromycin Other (See Comments)    seizure  . Penicillins Nausea And Vomiting and Rash  . Shellfish Allergy Swelling    Past Medical History, Surgical history, Social history, and Family History were reviewed and updated.  Review of Systems: Review of Systems  Constitutional: Negative.   HENT:  Negative.   Eyes: Negative.   Respiratory: Negative.   Cardiovascular: Negative.   Gastrointestinal: Negative.   Genitourinary: Negative.   Musculoskeletal: Negative.   Skin: Negative.   Neurological: Negative.   Endo/Heme/Allergies: Negative.   Psychiatric/Behavioral: Negative.      Physical Exam:  weight is 207 lb (93.9 kg). His oral temperature is 97.7 F (36.5 C). His blood pressure is 145/82 (abnormal) and his pulse is 63. His respiration is 20 and oxygen saturation is 98%.   Wt Readings from Last 3 Encounters:  02/11/18 207 lb (93.9 kg)  01/08/18 206 lb 2 oz (93.5 kg)  12/17/17 207 lb 6 oz (94.1 kg)     Physical Exam  Constitutional: He is oriented to person, place, and time.  HENT:  Head: Normocephalic and atraumatic.  Mouth/Throat: Oropharynx is clear and moist.  Eyes: Pupils are equal, round, and reactive to light. EOM are normal.  Neck: Normal range of motion.  Cardiovascular: Normal rate, regular rhythm and normal heart sounds.  Pulmonary/Chest: Effort normal and breath sounds normal.  Abdominal: Soft. Bowel sounds are normal.  Musculoskeletal: Normal range of motion. He exhibits no edema, tenderness or deformity.  Lymphadenopathy:    He has no cervical adenopathy.  Neurological: He is alert and oriented to person, place, and time.  Skin: Skin is warm  and dry. No rash noted. No erythema.  Psychiatric: He has a normal mood and affect. His behavior is normal. Judgment and thought content normal.  Vitals reviewed. Is Lab Results  Component Value Date   WBC 5.1 02/11/2018   HGB 14.7 02/11/2018   HCT 42.7 02/11/2018   MCV 100.5 (H) 02/11/2018   PLT 182 02/11/2018     Chemistry      Component Value Date/Time   NA 139 12/18/2017 0814   NA 144 01/03/2017 1114   K 4.8 12/18/2017 0814   K 4.5 01/03/2017 1114   CL 103 12/18/2017 0814   CL 108 01/03/2017 1114   CO2 29 12/18/2017 0814   CO2 32 01/03/2017 1114   BUN 15 12/18/2017 0814   BUN 13 01/03/2017  1114   CREATININE 0.88 12/18/2017 0814   CREATININE 0.90 08/14/2017 1412   CREATININE 1.1 01/03/2017 1114      Component Value Date/Time   CALCIUM 9.2 12/18/2017 0814   CALCIUM 9.1 01/03/2017 1114   ALKPHOS 116 12/18/2017 0814   ALKPHOS 125 (H) 01/03/2017 1114   AST 17 12/18/2017 0814   AST 23 08/14/2017 1412   ALT 11 12/18/2017 0814   ALT 16 08/14/2017 1412   ALT 22 01/03/2017 1114   BILITOT 0.7 12/18/2017 0814   BILITOT 0.6 08/14/2017 1412         Impression and Plan: Adam Melton is a 77 year old white male. He developed a thrombus in the right lower leg. He has had prior thromboembolic disease. He was taken off blood thinner with Coumadin and then developed another thrombus.  He is doing well on the maintenance Xarelto.  It is a tough decision as to whether or not we should take him off Xarelto after 1 year.  My inclination is that we should keep him on low-dose Xarelto.  He does travel quite a bit.  I know that we have done an extensive thrombophilic workup which has all been negative.  We will see him back in 4 weeks.  Hopefully, the Doppler will help [place] out.    [name], MD 7/31/201912:30 PM

## 2018-02-12 LAB — PROSTATE-SPECIFIC AG, SERUM (LABCORP): Prostate Specific Ag, Serum: <0.1 ng/mL (ref 0.0–4.0)

## 2018-02-13 ENCOUNTER — Telehealth: Payer: Self-pay | Admitting: *Deleted

## 2018-02-13 ENCOUNTER — Ambulatory Visit (HOSPITAL_BASED_OUTPATIENT_CLINIC_OR_DEPARTMENT_OTHER)
Admission: RE | Admit: 2018-02-13 | Discharge: 2018-02-13 | Disposition: A | Discharge status: Home / Self Care | Payer: Medicare Other | Source: Ambulatory Visit | Attending: Hematology & Oncology | Admitting: Hematology & Oncology

## 2018-02-13 DIAGNOSIS — I825Z1 Chronic embolism and thrombosis of unspecified deep veins of right distal lower extremity: Secondary | ICD-10-CM

## 2018-02-13 DIAGNOSIS — R600 Localized edema: Secondary | ICD-10-CM | POA: Diagnosis not present

## 2018-02-13 DIAGNOSIS — M7121 Synovial cyst of popliteal space [Baker], right knee: Secondary | ICD-10-CM | POA: Insufficient documentation

## 2018-02-13 DIAGNOSIS — M79605 Pain in left leg: Secondary | ICD-10-CM | POA: Diagnosis not present

## 2018-02-13 DIAGNOSIS — Z86718 Personal history of other venous thrombosis and embolism: Secondary | ICD-10-CM | POA: Diagnosis not present

## 2018-02-13 DIAGNOSIS — M79604 Pain in right leg: Secondary | ICD-10-CM | POA: Insufficient documentation

## 2018-02-13 DIAGNOSIS — M7989 Other specified soft tissue disorders: Secondary | ICD-10-CM | POA: Insufficient documentation

## 2018-02-13 DIAGNOSIS — M7122 Synovial cyst of popliteal space [Baker], left knee: Secondary | ICD-10-CM | POA: Insufficient documentation

## 2018-02-13 NOTE — Telephone Encounter (Addendum)
Patient is aware of results  ----- Message from [name], MD sent at 02/13/2018  1:21 PM EDT ----- Call - NO blood clot in your legs.  Laurey Arrow

## 2018-02-15 ENCOUNTER — Encounter: Payer: Self-pay | Admitting: Family Medicine

## 2018-02-17 ENCOUNTER — Telehealth: Payer: Self-pay | Admitting: Hematology & Oncology

## 2018-02-17 NOTE — Telephone Encounter (Signed)
Patient called in to confirm appointment for August 30

## 2018-02-18 DIAGNOSIS — L821 Other seborrheic keratosis: Secondary | ICD-10-CM | POA: Diagnosis not present

## 2018-02-18 DIAGNOSIS — Z85828 Personal history of other malignant neoplasm of skin: Secondary | ICD-10-CM | POA: Diagnosis not present

## 2018-02-18 DIAGNOSIS — L570 Actinic keratosis: Secondary | ICD-10-CM | POA: Diagnosis not present

## 2018-02-18 DIAGNOSIS — Z08 Encounter for follow-up examination after completed treatment for malignant neoplasm: Secondary | ICD-10-CM | POA: Diagnosis not present

## 2018-02-20 ENCOUNTER — Other Ambulatory Visit: Payer: Self-pay | Admitting: Family Medicine

## 2018-03-13 ENCOUNTER — Other Ambulatory Visit: Payer: Self-pay

## 2018-03-13 ENCOUNTER — Encounter: Payer: Self-pay | Admitting: Hematology & Oncology

## 2018-03-13 ENCOUNTER — Inpatient Hospital Stay: Payer: Medicare Other

## 2018-03-13 ENCOUNTER — Inpatient Hospital Stay: Payer: Medicare Other | Attending: Hematology & Oncology | Admitting: Hematology & Oncology

## 2018-03-13 VITALS — BP 135/81 | HR 58 | Temp 98.2°F | Resp 19 | Wt 210.0 lb

## 2018-03-13 DIAGNOSIS — Z7901 Long term (current) use of anticoagulants: Secondary | ICD-10-CM | POA: Insufficient documentation

## 2018-03-13 DIAGNOSIS — I8390 Asymptomatic varicose veins of unspecified lower extremity: Secondary | ICD-10-CM | POA: Insufficient documentation

## 2018-03-13 DIAGNOSIS — M7989 Other specified soft tissue disorders: Secondary | ICD-10-CM | POA: Insufficient documentation

## 2018-03-13 DIAGNOSIS — I825Z1 Chronic embolism and thrombosis of unspecified deep veins of right distal lower extremity: Secondary | ICD-10-CM

## 2018-03-13 DIAGNOSIS — Z86718 Personal history of other venous thrombosis and embolism: Secondary | ICD-10-CM

## 2018-03-13 LAB — CBC WITH DIFFERENTIAL (CANCER CENTER ONLY)
Basophils Absolute: 0 10*3/uL (ref 0.0–0.1)
Basophils Relative: 1 %
EOS ABS: 0.2 10*3/uL (ref 0.0–0.5)
Eosinophils Relative: 3 %
HEMATOCRIT: 41.6 % (ref 38.7–49.9)
HEMOGLOBIN: 14.2 g/dL (ref 13.0–17.1)
LYMPHS ABS: 1 10*3/uL (ref 0.9–3.3)
LYMPHS PCT: 19 %
MCH: 34.3 pg — ABNORMAL HIGH (ref 28.0–33.4)
MCHC: 34.1 g/dL (ref 32.0–35.9)
MCV: 100.5 fL — ABNORMAL HIGH (ref 82.0–98.0)
MONOS PCT: 11 %
Monocytes Absolute: 0.6 10*3/uL (ref 0.1–0.9)
NEUTROS ABS: 3.5 10*3/uL (ref 1.5–6.5)
NEUTROS PCT: 66 %
Platelet Count: 182 10*3/uL (ref 145–400)
RBC: 4.14 MIL/uL — AB (ref 4.20–5.70)
RDW: 13.1 % (ref 11.1–15.7)
WBC: 5.3 10*3/uL (ref 4.0–10.0)

## 2018-03-13 LAB — CMP (CANCER CENTER ONLY)
ALBUMIN: 4.1 g/dL (ref 3.5–5.0)
ALK PHOS: 132 U/L — AB (ref 38–126)
ALT: 10 U/L (ref 0–44)
ANION GAP: 10 (ref 5–15)
AST: 20 U/L (ref 15–41)
BUN: 23 mg/dL (ref 8–23)
CALCIUM: 9.1 mg/dL (ref 8.9–10.3)
CHLORIDE: 106 mmol/L (ref 98–111)
CO2: 26 mmol/L (ref 22–32)
Creatinine: 0.89 mg/dL (ref 0.61–1.24)
GFR, Est AFR Am: >60 mL/min (ref >60)
GFR, Estimated: >60 mL/min (ref >60)
GLUCOSE: 90 mg/dL (ref 70–99)
POTASSIUM: 4.6 mmol/L (ref 3.5–5.1)
SODIUM: 142 mmol/L (ref 135–145)
Total Bilirubin: 0.4 mg/dL (ref 0.3–1.2)
Total Protein: 6.9 g/dL (ref 6.5–8.1)

## 2018-03-13 LAB — D-DIMER, QUANTITATIVE: D-Dimer, Quant: 0.57 ug/mL-FEU — ABNORMAL HIGH (ref 0.00–0.50)

## 2018-03-13 MED ORDER — RIVAROXABAN 2.5 MG PO TABS: 5.0000 mg | ORAL_TABLET | Freq: Every day | ORAL | 5 refills | Status: DC

## 2018-03-13 NOTE — Progress Notes (Signed)
Hematology and Oncology Follow Up Visit  Adam Melton [id number] July 11, 1941 77 y.o. 03/13/2018   Principle Diagnosis:   DVT of the right gastrocnemius vein  Current Therapy:   Xarelto 5 mg by mouth daily - maintanence    Interim History:  Adam Melton is back for follow-up.  He is doing okay.  We did do a Doppler of his legs when we last saw him.  This was done on August 2.  There is no thrombus that was noted.  He had a thickening in a superficial vein.  He really wants to be on Xarelto.  I told him that we can have him on 5 mg daily.  Unfortunately, there is no 5 mg tablet so I will have to do 2.5 mg tablets and he will take 2 a day.  He still has some swelling in his legs.  This is something that is chronic.  He does have some varicose veins.  He is eating okay.  He has had no problems with nausea or vomiting.  He has had no bleeding.  There is been no change in bowel or bladder habits.  Overall, his performance status is ECOG 0.  Medications:  Current Outpatient Medications:  .  busPIRone (BUSPAR) 5 MG tablet, Take 1 tablet (5 mg total) by mouth daily., Disp: 90 tablet, Rfl: 1 .  DILANTIN 100 MG ER capsule, TAKE TWO CAPSULES BY MOUTH EVERY MORNING AND TAKE THREE CAPSULES AT BEDTIME, Disp: 450 capsule, Rfl: 1 .  latanoprost (XALATAN) 0.005 % ophthalmic solution, instill one drop in [facility] eye AT BEDTIME, Disp: , Rfl: 5 .  timolol (TIMOPTIC) 0.5 % ophthalmic solution, 1 drop 2 (two) times daily., Disp: , Rfl:   Allergies:  Allergies  Allergen Reactions  . Azithromycin Other (See Comments)    seizure  . Penicillins Nausea And Vomiting and Rash  . Shellfish Allergy Swelling    Past Medical History, Surgical history, Social history, and Family History were reviewed and updated.  Review of Systems: Review of Systems  Constitutional: Negative.   HENT: Negative.   Eyes: Negative.   Respiratory: Negative.   Cardiovascular: Negative.   Gastrointestinal: Negative.     Genitourinary: Negative.   Musculoskeletal: Negative.   Skin: Negative.   Neurological: Negative.   Endo/Heme/Allergies: Negative.   Psychiatric/Behavioral: Negative.      Physical Exam:  weight is 210 lb (95.3 kg). His oral temperature is 98.2 F (36.8 C). His blood pressure is 135/81 and his pulse is 58 (abnormal). His respiration is 19 and oxygen saturation is 98%.   Wt Readings from Last 3 Encounters:  03/13/18 210 lb (95.3 kg)  02/11/18 207 lb (93.9 kg)  01/08/18 206 lb 2 oz (93.5 kg)     Physical Exam  Constitutional: He is oriented to person, place, and time.  HENT:  Head: Normocephalic and atraumatic.  Mouth/Throat: Oropharynx is clear and moist.  Eyes: Pupils are equal, round, and reactive to light. EOM are normal.  Neck: Normal range of motion.  Cardiovascular: Normal rate, regular rhythm and normal heart sounds.  Pulmonary/Chest: Effort normal and breath sounds normal.  Abdominal: Soft. Bowel sounds are normal.  Musculoskeletal: Normal range of motion. He exhibits no edema, tenderness or deformity.  Lymphadenopathy:    He has no cervical adenopathy.  Neurological: He is alert and oriented to person, place, and time.  Skin: Skin is warm and dry. No rash noted. No erythema.  Psychiatric: He has a normal mood and affect. His behavior is normal.  Judgment and thought content normal.  Vitals reviewed. Is Lab Results  Component Value Date   WBC 5.3 03/13/2018   HGB 14.2 03/13/2018   HCT 41.6 03/13/2018   MCV 100.5 (H) 03/13/2018   PLT 182 03/13/2018     Chemistry      Component Value Date/Time   NA 142 03/13/2018 1056   NA 144 01/03/2017 1114   K 4.6 03/13/2018 1056   K 4.5 01/03/2017 1114   CL 106 03/13/2018 1056   CL 108 01/03/2017 1114   CO2 26 03/13/2018 1056   CO2 32 01/03/2017 1114   BUN 23 03/13/2018 1056   BUN 13 01/03/2017 1114   CREATININE 0.89 03/13/2018 1056   CREATININE 1.1 01/03/2017 1114      Component Value Date/Time   CALCIUM 9.1  03/13/2018 1056   CALCIUM 9.1 01/03/2017 1114   ALKPHOS 132 (H) 03/13/2018 1056   ALKPHOS 125 (H) 01/03/2017 1114   AST 20 03/13/2018 1056   ALT 10 03/13/2018 1056   ALT 22 01/03/2017 1114   BILITOT 0.4 03/13/2018 1056         Impression and Plan: Adam Melton is a 77 year old white male. He developed a thrombus in the right lower leg. He has had prior thromboembolic disease. He was taken off blood thinner with Coumadin and then developed another thrombus.  We will see how he does with the 5 mg dose of Xarelto.  I think this would be reasonable.  We will give him peace of mind.  I think that the risk of bleeding is going to be quite low.  I will plan to see him back in 3 months.  If all looks good in 3 months, then we will plan for 6 months afterwards.  [name], MD 8/30/201912:23 PM

## 2018-03-17 DIAGNOSIS — H401131 Primary open-angle glaucoma, bilateral, mild stage: Secondary | ICD-10-CM | POA: Diagnosis not present

## 2018-03-17 DIAGNOSIS — H25813 Combined forms of age-related cataract, bilateral: Secondary | ICD-10-CM | POA: Diagnosis not present

## 2018-03-23 ENCOUNTER — Ambulatory Visit (INDEPENDENT_AMBULATORY_CARE_PROVIDER_SITE_OTHER): Payer: Medicare Other

## 2018-03-23 ENCOUNTER — Other Ambulatory Visit: Payer: Self-pay

## 2018-03-23 DIAGNOSIS — Z0000 Encounter for general adult medical examination without abnormal findings: Secondary | ICD-10-CM | POA: Diagnosis not present

## 2018-03-23 DIAGNOSIS — Z23 Encounter for immunization: Secondary | ICD-10-CM | POA: Diagnosis not present

## 2018-03-23 NOTE — Patient Instructions (Addendum)
Continue doing brain stimulating activities (puzzles, reading, adult coloring books, staying active) to keep memory sharp.   Health Maintenance, Male A healthy lifestyle and preventive care is important for your health and wellness. Ask your health care provider about what schedule of regular examinations is right for you. What should I know about weight and diet? Eat a Healthy Diet  Eat plenty of vegetables, fruits, whole grains, low-fat dairy products, and lean protein.  Do not eat a lot of foods high in solid fats, added sugars, or salt.  Maintain a Healthy Weight Regular exercise can help you achieve or maintain a healthy weight. You should:  Do at least 150 minutes of exercise each week. The exercise should increase your heart rate and make you sweat (moderate-intensity exercise).  Do strength-training exercises at least twice a week.  Watch Your Levels of Cholesterol and Blood Lipids  Have your blood tested for lipids and cholesterol every 5 years starting at 76 years of age. If you are at high risk for heart disease, you should start having your blood tested when you are 76 years old. You may need to have your cholesterol levels checked more often if: ? Your lipid or cholesterol levels are high. ? You are older than 76 years of age. ? You are at high risk for heart disease.  What should I know about cancer screening? Many types of cancers can be detected early and may often be prevented. Lung Cancer  You should be screened every year for lung cancer if: ? You are a current smoker who has smoked for at least 30 years. ? You are a former smoker who has quit within the past 15 years.  Talk to your health care provider about your screening options, when you should start screening, and how often you should be screened.  Colorectal Cancer  Routine colorectal cancer screening usually begins at 76 years of age and should be repeated every 5-10 years until you are 75 years old. You  may need to be screened more often if early forms of precancerous polyps or small growths are found. Your health care provider may recommend screening at an earlier age if you have risk factors for colon cancer.  Your health care provider may recommend using home test kits to check for hidden blood in the stool.  A small camera at the end of a tube can be used to examine your colon (sigmoidoscopy or colonoscopy). This checks for the earliest forms of colorectal cancer.  Prostate and Testicular Cancer  Depending on your age and overall health, your health care provider may do certain tests to screen for prostate and testicular cancer.  Talk to your health care provider about any symptoms or concerns you have about testicular or prostate cancer.  Skin Cancer  Check your skin from head to toe regularly.  Tell your health care provider about any new moles or changes in moles, especially if: ? There is a change in a mole's size, shape, or color. ? You have a mole that is larger than a pencil eraser.  Always use sunscreen. Apply sunscreen liberally and repeat throughout the day.  Protect yourself by wearing long sleeves, pants, a wide-brimmed hat, and sunglasses when outside.  What should I know about heart disease, diabetes, and high blood pressure?  If you are 18-39 years of age, have your blood pressure checked every 3-5 years. If you are 40 years of age or older, have your blood pressure checked every year. You   should have your blood pressure measured twice-once when you are at a hospital or clinic, and once when you are not at a hospital or clinic. Record the average of the two measurements. To check your blood pressure when you are not at a hospital or clinic, you can use: ? An automated blood pressure machine at a pharmacy. ? A home blood pressure monitor.  Talk to your health care provider about your target blood pressure.  If you are between 45-79 years old, ask your health care  provider if you should take aspirin to prevent heart disease.  Have regular diabetes screenings by checking your fasting blood sugar level. ? If you are at a normal weight and have a low risk for diabetes, have this test once every three years after the age of 45. ? If you are overweight and have a high risk for diabetes, consider being tested at a younger age or more often.  A one-time screening for abdominal aortic aneurysm (AAA) by ultrasound is recommended for men aged 65-75 years who are current or former smokers. What should I know about preventing infection? Hepatitis B If you have a higher risk for hepatitis B, you should be screened for this virus. Talk with your health care provider to find out if you are at risk for hepatitis B infection. Hepatitis C Blood testing is recommended for:  Everyone born from 1945 through 1965.  Anyone with known risk factors for hepatitis C.  Sexually Transmitted Diseases (STDs)  You should be screened each year for STDs including gonorrhea and chlamydia if: ? You are sexually active and are younger than 76 years of age. ? You are older than 76 years of age and your health care provider tells you that you are at risk for this type of infection. ? Your sexual activity has changed since you were last screened and you are at an increased risk for chlamydia or gonorrhea. Ask your health care provider if you are at risk.  Talk with your health care provider about whether you are at high risk of being infected with HIV. Your health care provider may recommend a prescription medicine to help prevent HIV infection.  What else can I do?  Schedule regular health, dental, and eye exams.  Stay current with your vaccines (immunizations).  Do not use any tobacco products, such as cigarettes, chewing tobacco, and e-cigarettes. If you need help quitting, ask your health care provider.  Limit alcohol intake to no more than 2 drinks per day. One drink equals 12  ounces of beer, 5 ounces of wine, or 1 ounces of hard liquor.  Do not use street drugs.  Do not share needles.  Ask your health care provider for help if you need support or information about quitting drugs.  Tell your health care provider if you often feel depressed.  Tell your health care provider if you have ever been abused or do not feel safe at home. This information is not intended to replace advice given to you by your health care provider. Make sure you discuss any questions you have with your health care provider. Document Released: 12/28/2007 Document Revised: 02/28/2016 Document Reviewed: 04/04/2015 Elsevier Interactive Patient Education  2018 Elsevier Inc.  

## 2018-03-23 NOTE — Progress Notes (Addendum)
Subjective:   Adam Melton is a 77 y.o. male who presents for Medicare Annual/Subsequent preventive examination.  Review of Systems:  No ROS.  Medicare Wellness Visit. Additional risk factors are reflected in the social history.  Cardiac Risk Factors include: advanced age (>32men, >46 women);male gender;dyslipidemia;sedentary lifestyle;family history of premature cardiovascular disease   Sleep patterns: Sleeps 6-7 hours.  Home Safety/Smoke Alarms: Feels safe in home. Smoke alarms in place.  Living environment; residence and Firearm Safety: Lives with wife in apt on 2nd floor.  Seat Belt Safety/Bike Helmet: Wears seat belt.    Male:   CCS-Colonoscopy 10/12/2015, polyp. Recall 3 years.    PSA-  Lab Results  Component Value Date   PSA 0.01 (L) 12/18/2017   PSA 0.00 Repeated and verified X2. (L) 12/23/2016   PSA 0.00 Repeated and verified X2. (L) 11/16/2015       Objective:    Vitals: BP 138/78 (BP Location: Left Arm, Patient Position: Sitting, Cuff Size: Normal)   Pulse 66   Ht 6' (1.829 m)   Wt 212 lb (96.2 kg)   SpO2 96%   BMI 28.75 kg/m   Body mass index is 28.75 kg/m.  Advanced Directives 03/23/2018 03/13/2018 02/11/2018 08/14/2017 03/14/2017 09/27/2016 10/12/2015  Does Patient Have a Medical Advance Directive? Yes Yes Yes Yes Yes Yes Yes  Type of Paramedic of [place];Living will [place];Living will [place];Living will Living will;Healthcare Power of [place];Living will [place];Living will [place];Living will  Does patient want to make changes to medical advance directive? - - No - Patient declined No - Patient declined - - -  Copy of [place] in Chart? Yes - Yes No - copy requested No - copy requested No - copy requested -    Tobacco Social History   Tobacco Use  Smoking Status Never Smoker  Smokeless Tobacco  Never Used     Counseling given: Not Answered   Past Medical History:  Diagnosis Date  . Anisocoria    R pupil larger than L  . Anxiety   . Baker's cyst of knee 03/2016   Right  . Cataract    Bilat; no surgery  . Colon polyps 11/2008 & 2017  . Diverticulosis 11/2008  . DVT of lower limb, acute ([place]) 03/19/2016   Right gastroc (xarelto started).  Full thrombophilia panel by Dr. Marin Olp 09/2016 was NORMAL.  Dr. Marin Olp recommended low dose xarelto continuation in order to decrease the risk of recurrent DVT (finishes 1 yr of anticoag 03/2017, then will start low dose xarelto)--as per Hem/onc 01/2018 f/u.  Marland Kitchen Glaucoma    Primary open angle glaucoma OU, mild stage [name], MD)-stable as of 09/2017 ophth f/u.  Marland Kitchen History of prostate cancer   . Hyperlipidemia 11/2015  . 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 Lung nodule 10/2014   calcified granulomata  . Nephrolithiasis   . Osteopenia 2009   By DEXA  . 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 Ptosis, paralytic, right   . 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  Last seizure 2007.  Marland Kitchen Skull fracture ([name])    > 30 years ago; now with seizure d/o  . 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 Thrombosis of saphenous vein 2015   Left; PCP in [place] put him on xarelto x 3 mo.  F/u ultrasound was NORMAL.  . 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 CARPAL TUNNEL RELEASE Right   . CARPAL TUNNEL RELEASE    . COLONOSCOPY W/ POLYPECTOMY  12/02/08; 10/12/15   Recall 3 yrs (Dr. Henrene Pastor)  . DEXA  09/02/07   Osteopenia  . INCISION AND DRAINAGE Left    knee  . LITHOTRIPSY    . ROBOT ASSISTED LAPAROSCOPIC RADICAL PROSTATECTOMY  2008  . SUBDURAL HEMATOMA EVACUATION VIA  CRANIOTOMY     + temporal intracerebral hematoma--surgical drainage  . 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   Family History  Problem Relation Age of Onset  . Stroke Mother   . Alcohol abuse Father   . Cancer Sister        Ovarian  . Heart disease Brother   . Diabetes Neg Hx   . Colon cancer Neg Hx    Social History   Socioeconomic History  . Marital status: Married    Spouse name: Not on file  . Number of children: Not on file  . Years of education: Not on file  . Highest education level: Not on file  Occupational History  . Not on file  Social Needs  . Financial resource strain: Not on file  . Food insecurity:    Worry: Not on file    Inability: Not on file  . Transportation needs:    Medical: Not on file    Non-medical: Not on file  Tobacco Use  . Smoking status: Never Smoker  . Smokeless tobacco: Never Used  Substance and Sexual Activity  . Alcohol use: No    Alcohol/week: 0.0 standard drinks  . Drug use: No  . Sexual activity: Not on file  Lifestyle  . Physical activity:    Days per week: Not on file    Minutes per session: Not on file  . Stress: Not on file  Relationships  . Social connections:    Talks on phone: Not on file    Gets together: Not on file    Attends religious service: Not on file    Active member of club or organization: Not on file    Attends meetings of clubs or organizations: Not on file    Relationship status: Not on file  Other Topics Concern  . Not on file  Social History Narrative   Married, has one daughter and 3 grandchildren.   Relocated from [place], [place] 05/2015.   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    Outpatient Encounter Medications as of 03/23/2018  Medication Sig  . busPIRone (BUSPAR) 5 MG tablet Take 1 tablet (5 mg total) by mouth daily.  . Cholecalciferol (VITAMIN D PO) Take by mouth.  Marland Kitchen DILANTIN 100 MG ER capsule TAKE  TWO CAPSULES BY MOUTH EVERY MORNING AND TAKE THREE CAPSULES AT BEDTIME  . latanoprost (XALATAN) 0.005 % ophthalmic solution instill one drop in [facility] eye AT BEDTIME  . Multiple Vitamins-Minerals (HM MULTIVITAMIN ADULT GUMMY PO) Take by mouth.  . rivaroxaban (XARELTO) 2.5 MG TABS tablet Take 2 tablets (5 mg total) by mouth daily.  . timolol (TIMOPTIC) 0.5 % ophthalmic solution 1 drop 2 (two) times daily.   No  facility-administered encounter medications on file as of 03/23/2018.     Activities of Daily Living In your present state of health, do you have any difficulty performing the following activities: 03/23/2018  Hearing? Y  Vision? N  Difficulty concentrating or making decisions? N  Walking or climbing stairs? N  Dressing or bathing? N  Doing errands, shopping? N  Preparing Food and eating ? N  Using the Toilet? N  In the past six months, have you accidently leaked urine? N  Do you have problems with loss of bowel control? N  Managing your Medications? N  Managing your Finances? N  Housekeeping or managing your Housekeeping? N  Some recent data might be hidden    Patient Care Team: Tammi Sou, MD as PCP - General (Family Medicine) Druscilla Brownie, MD as Consulting Physician (Dermatology) Latanya Maudlin, MD as Consulting Physician (Orthopedic Surgery) [name], MD as Consulting Physician (Ophthalmology) [name], MD as Consulting Physician (Oncology)   Assessment:   This is a routine wellness examination for Adam Melton.  Exercise Activities and Dietary recommendations Current Exercise Habits: Home exercise routine, Type of exercise: walking, Time (Minutes): 30, Frequency (Times/Week): 2, Weekly Exercise (Minutes/Week): 60, Exercise limited by: None identified   Diet (meal preparation, eat out, water intake, caffeinated beverages, dairy products, fruits and vegetables): Drinks water  Breakfast: oatmeal/bagel; cereal; coffee Lunch: rye bread sandwich,  orange Dinner: protein/veggies; [place]      Goals    . Weight (lb) < 185 lb (83.9 kg)     Lose weight by continuing low carb meals and walking.     . Weight (lb) < 195 lb (88.5 kg)     Lose weight, low carb and walking.        Fall Risk Fall Risk  03/23/2018 03/14/2017 11/16/2015  Falls in the past year? No No No    Depression Screen PHQ 2/9 Scores 03/23/2018 03/14/2017 11/16/2015  PHQ - 2 Score 0 0 0    Cognitive Function MMSE - Mini Mental State Exam 03/23/2018 03/14/2017  Orientation to time 5 4  Orientation to Place 4 5  Registration 3 3  Attention/ Calculation 5 5  Recall 0 3  Language- name 2 objects 2 2  Language- repeat 1 1  Language- follow 3 step command 3 3  Language- read & follow direction 1 1  Write a sentence 1 1  Copy design 1 1  Total score 26 29        Immunization History  Administered Date(s) Administered  . Influenza, High Dose Seasonal PF 06/22/2015, 03/23/2018  . Pneumococcal Conjugate-13 11/16/2015  . Pneumococcal Polysaccharide-23 06/17/2017     Screening Tests Health Maintenance  Topic Date Due  . TETANUS/TDAP  03/24/2019 (Originally 03/03/1960)  . COLONOSCOPY  10/12/2018  . INFLUENZA VACCINE  Completed  . PNA vac Low Risk Adult  Completed        Plan:    Continue doing brain stimulating activities (puzzles, reading, adult coloring books, staying active) to keep memory sharp.   I have personally reviewed and noted the following in the patient's chart:   . Medical and social history . Use of alcohol, tobacco or illicit drugs  . Current medications and supplements . Functional ability and status . Nutritional status . Physical activity . Advanced directives . List of other physicians . Hospitalizations, surgeries, and ER visits in previous 12 months . Vitals . Screenings to include cognitive, depression, and falls . Referrals and appointments  In addition, I have  reviewed and discussed with patient certain preventive protocols,  quality metrics, and best practice recommendations. A written personalized care plan for preventive services as well as general preventive health recommendations were provided to patient.     Gerilyn Nestle, RN  03/23/2018  PCP Notes: -Pt reports using "Lifewave X 39" patches (enhances stem cell activity) -F/U with PCP 06/2018  Medical screening examination/treatment/procedure(s) were performed by non-physician practitioner and as supervising physician I was immediately available for consultation/collaboration.  I agree with above assessment and plan.  Electronically Signed by: Howard Pouch, DO [place] [place]

## 2018-03-24 ENCOUNTER — Encounter: Payer: Self-pay | Admitting: Family Medicine

## 2018-03-24 NOTE — Progress Notes (Signed)
AWV reviewed and agree. Signed:  Crissie Sickles, MD           03/24/2018

## 2018-04-10 IMAGING — CR DG KNEE COMPLETE 4+V*R*
4 series · 4 of 4 positions shown · non-contrast
Comparison: None.

CLINICAL DATA: Right knee pain and swelling

EXAM:
RIGHT KNEE - COMPLETE 4+ VIEW

[t knee ap right]
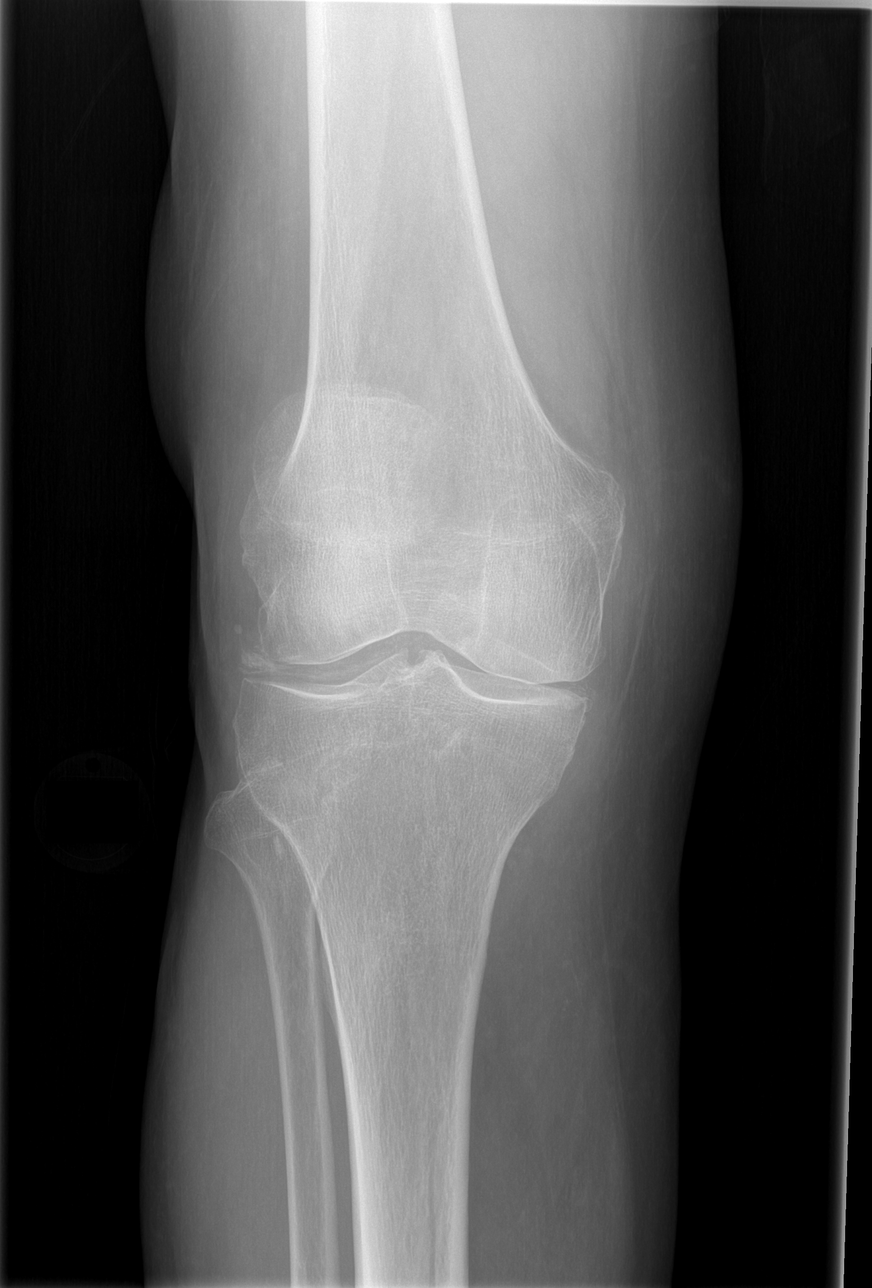

[t knee oblique right (1 of 2)]
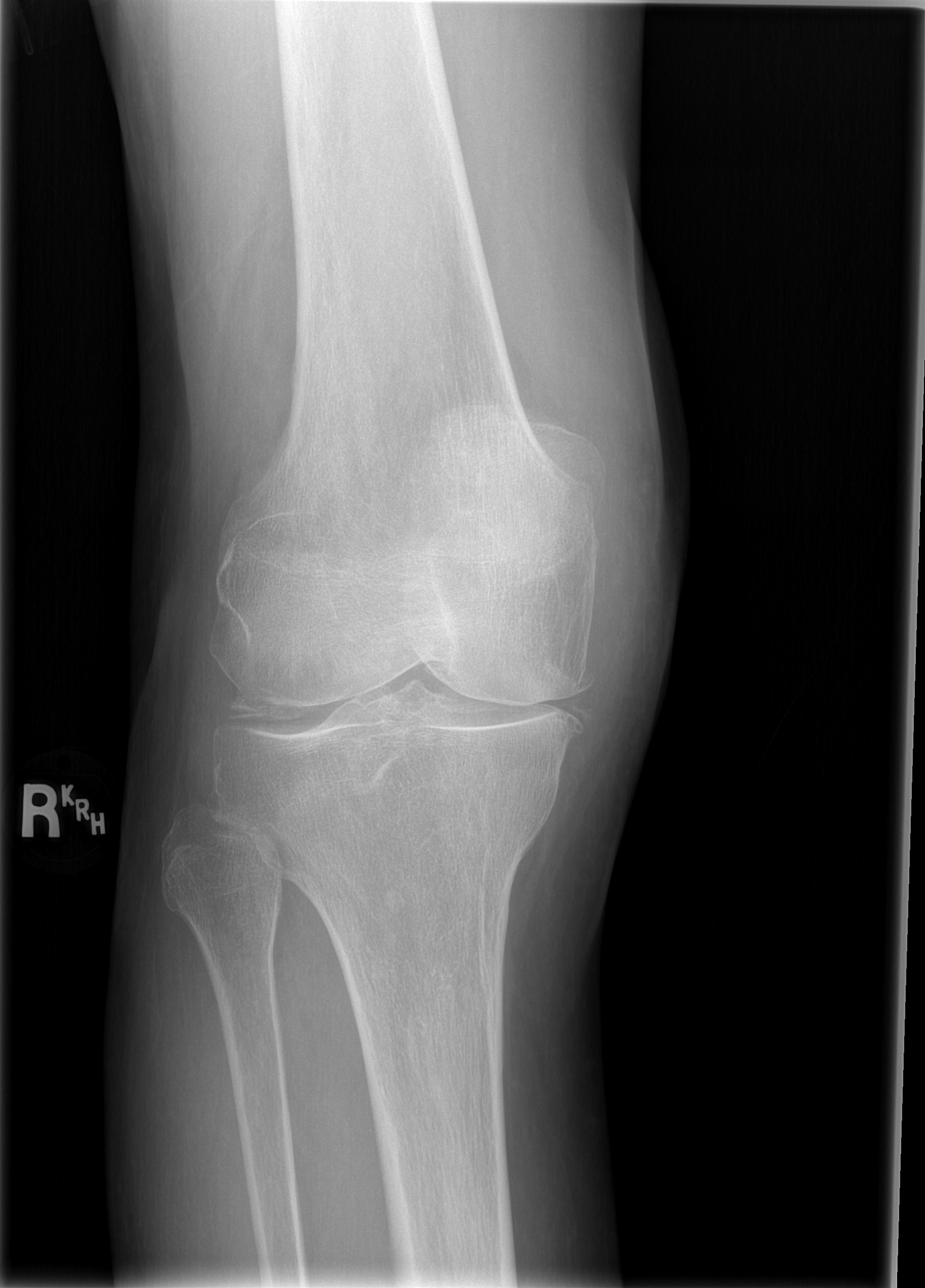

[t knee oblique right (2 of 2)]
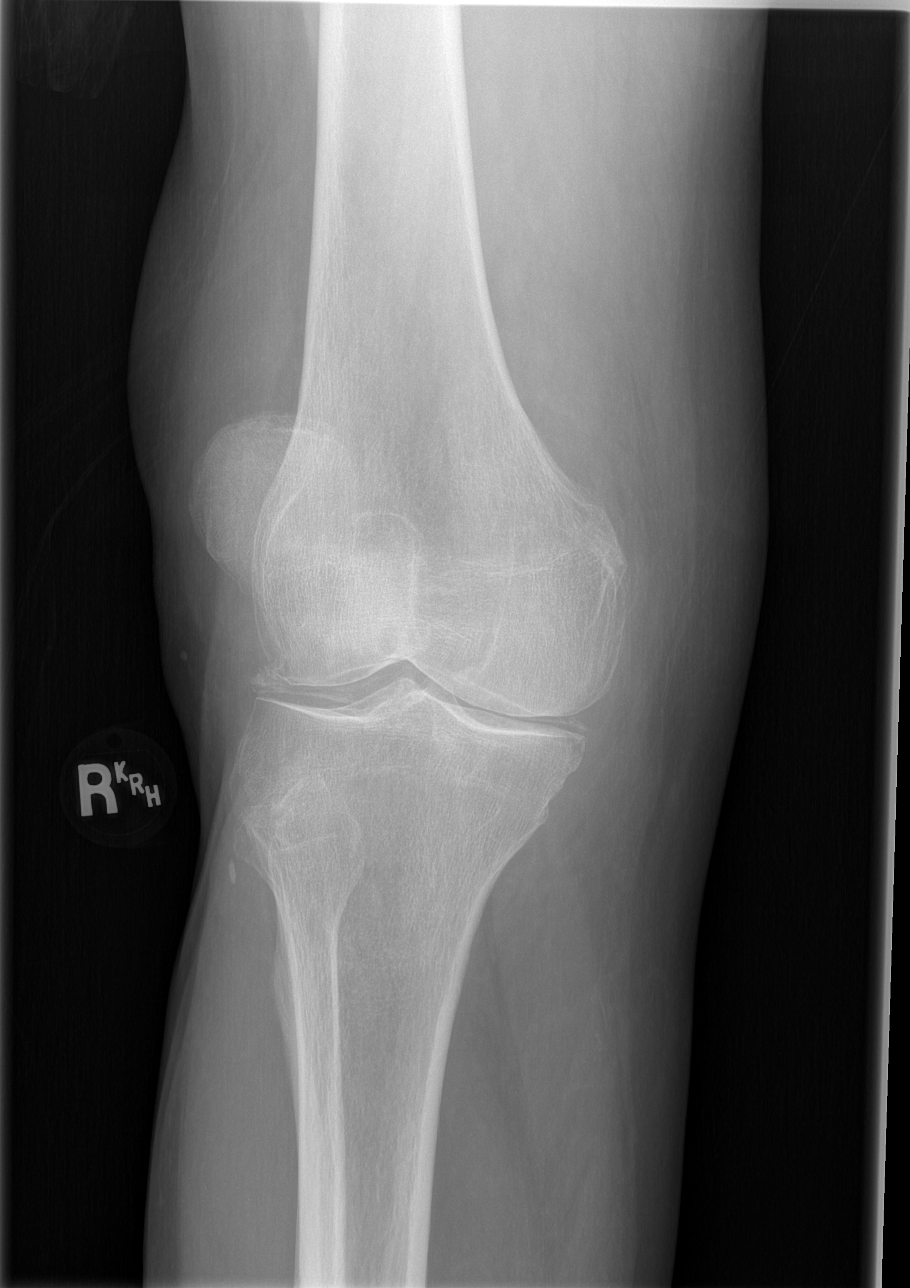

[t knee lat right]
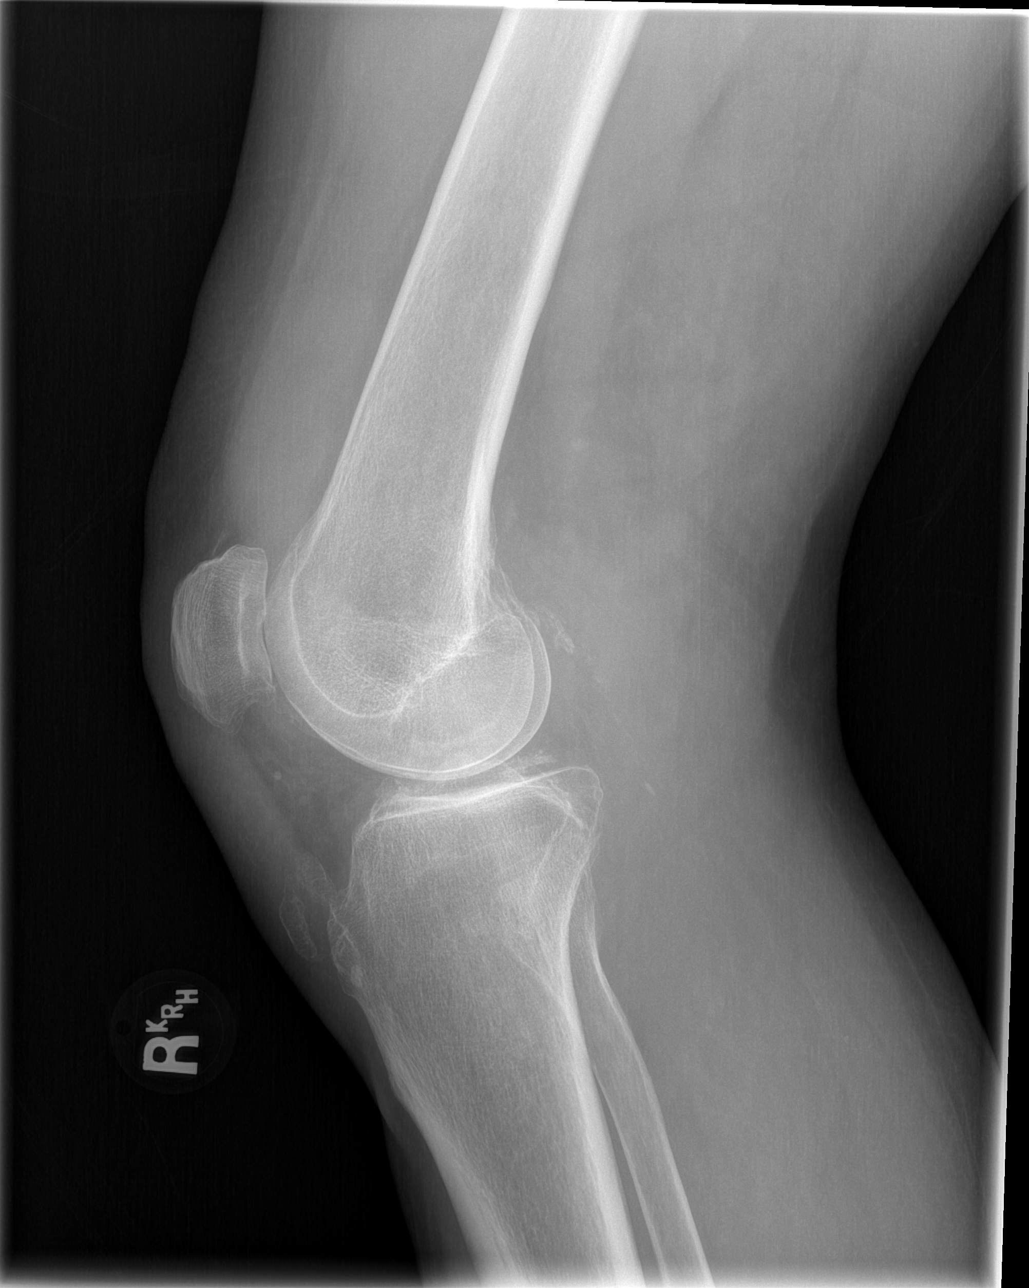

[4 of 4 positions shown; findings below may reference images not displayed]

FINDINGS: No evidence of acute fracture or malalignment. Chondrocalcinosis is
present with partial ossification of a both [place]. Mild
tricompartmental degenerative osteoarthritis with joint space
narrowing and productive osteophyte formation. There is a large
suprapatellar knee joint effusion. Density in the popliteal fossa
consistent with a probable Baker cyst. Atherosclerotic calcification
noted along the course of the popliteal artery. There is some
ossification within the patellar ligament adjacent to the tibial
insertion.
IMPRESSION: 1. Large knee joint effusion.
2. Mild tricompartmental degenerative osteoarthritis.
3. Moderate medial and lateral compartmental chondrocalcinosis.
4. Density in the popliteal fossa consistent with a probable Baker
cyst.
5. Atherosclerotic calcifications noted in the popliteal artery.

## 2018-06-05 ENCOUNTER — Other Ambulatory Visit: Payer: Self-pay

## 2018-06-05 ENCOUNTER — Inpatient Hospital Stay: Payer: Medicare Other | Attending: Hematology & Oncology | Admitting: Hematology & Oncology

## 2018-06-05 ENCOUNTER — Inpatient Hospital Stay: Payer: Medicare Other

## 2018-06-05 ENCOUNTER — Encounter: Payer: Self-pay | Admitting: Hematology & Oncology

## 2018-06-05 VITALS — BP 132/80 | HR 56 | Temp 97.5°F | Resp 18 | Wt 217.0 lb

## 2018-06-05 DIAGNOSIS — Z7901 Long term (current) use of anticoagulants: Secondary | ICD-10-CM | POA: Diagnosis not present

## 2018-06-05 DIAGNOSIS — I825Z1 Chronic embolism and thrombosis of unspecified deep veins of right distal lower extremity: Secondary | ICD-10-CM

## 2018-06-05 DIAGNOSIS — Z86718 Personal history of other venous thrombosis and embolism: Secondary | ICD-10-CM | POA: Diagnosis not present

## 2018-06-05 DIAGNOSIS — Z79899 Other long term (current) drug therapy: Secondary | ICD-10-CM | POA: Diagnosis not present

## 2018-06-05 LAB — CMP (CANCER CENTER ONLY)
ALBUMIN: 3.6 g/dL (ref 3.5–5.0)
ALT: 21 U/L (ref 10–47)
AST: 24 U/L (ref 11–38)
Alkaline Phosphatase: 127 U/L — ABNORMAL HIGH (ref 26–84)
Anion gap: 6 (ref 5–15)
BUN: 14 mg/dL (ref 7–22)
CHLORIDE: 106 mmol/L (ref 98–108)
CO2: 31 mmol/L (ref 18–33)
CREATININE: 1 mg/dL (ref 0.60–1.20)
Calcium: 8.9 mg/dL (ref 8.0–10.3)
Glucose, Bld: 90 mg/dL (ref 73–118)
Potassium: 4.6 mmol/L (ref 3.3–4.7)
SODIUM: 143 mmol/L (ref 128–145)
Total Bilirubin: 0.7 mg/dL (ref 0.2–1.6)
Total Protein: 6.4 g/dL (ref 6.4–8.1)

## 2018-06-05 LAB — CBC WITH DIFFERENTIAL (CANCER CENTER ONLY)
Abs Immature Granulocytes: 0.03 10*3/uL (ref 0.00–0.07)
Basophils Absolute: 0 10*3/uL (ref 0.0–0.1)
Basophils Relative: 0 %
EOS PCT: 5 %
Eosinophils Absolute: 0.2 10*3/uL (ref 0.0–0.5)
HEMATOCRIT: 41.9 % (ref 39.0–52.0)
HEMOGLOBIN: 13.9 g/dL (ref 13.0–17.0)
Immature Granulocytes: 1 %
LYMPHS ABS: 1 10*3/uL (ref 0.7–4.0)
LYMPHS PCT: 21 %
MCH: 33.7 pg (ref 26.0–34.0)
MCHC: 33.2 g/dL (ref 30.0–36.0)
MCV: 101.7 fL — AB (ref 80.0–100.0)
MONO ABS: 0.6 10*3/uL (ref 0.1–1.0)
MONOS PCT: 12 %
Neutro Abs: 2.9 10*3/uL (ref 1.7–7.7)
Neutrophils Relative %: 61 %
Platelet Count: 187 10*3/uL (ref 150–400)
RBC: 4.12 MIL/uL — ABNORMAL LOW (ref 4.22–5.81)
RDW: 13 % (ref 11.5–15.5)
WBC Count: 4.7 10*3/uL (ref 4.0–10.5)
nRBC: 0 % (ref 0.0–0.2)

## 2018-06-05 NOTE — Progress Notes (Signed)
Hematology and Oncology Follow Up Visit  Adam Melton [id number] 02-09-1941 77 y.o. 06/05/2018   Principle Diagnosis:   DVT of the right gastrocnemius vein  Current Therapy:   Xarelto 5 mg by mouth daily - maintanence    Interim History:  Adam Melton is back for follow-up.  He actually comes in with his wife.  He is doing pretty well.  He now is on 5 mg of Xarelto.  He has had no problems with the 5 mg dose.  He has had no issues with his right leg.  There is been no bleeding.  He has had no change in bowel or bladder habits.  He has had no leg swelling.  He has had no nausea or vomiting.  There has been no chest wall pain.  He has had some memory issues.  Thankfully he has had no seizures.  He is on Dilantin for this.  Overall, his performance status is ECOG 0.  Medications:  Current Outpatient Medications:  .  busPIRone (BUSPAR) 5 MG tablet, Take 1 tablet (5 mg total) by mouth daily., Disp: 90 tablet, Rfl: 1 .  Cholecalciferol (VITAMIN D PO), Take by mouth., Disp: , Rfl:  .  DILANTIN 100 MG ER capsule, TAKE TWO CAPSULES BY MOUTH EVERY MORNING AND TAKE THREE CAPSULES AT BEDTIME, Disp: 450 capsule, Rfl: 1 .  latanoprost (XALATAN) 0.005 % ophthalmic solution, instill one drop in EACH eye AT BEDTIME, Disp: , Rfl: 5 .  Multiple Vitamins-Minerals (HM MULTIVITAMIN ADULT GUMMY PO), Take by mouth., Disp: , Rfl:  .  rivaroxaban (XARELTO) 2.5 MG TABS tablet, Take 2 tablets (5 mg total) by mouth daily., Disp: 60 tablet, Rfl: 5 .  timolol (TIMOPTIC) 0.5 % ophthalmic solution, 1 drop 2 (two) times daily., Disp: , Rfl:   Allergies:  Allergies  Allergen Reactions  . Azithromycin Other (See Comments)    seizure  . Penicillins Nausea And Vomiting and Rash  . Shellfish Allergy Swelling    Past Medical History, Surgical history, Social history, and Family History were reviewed and updated.  Review of Systems: Review of Systems  Constitutional: Negative.   HENT: Negative.   Eyes:  Negative.   Respiratory: Negative.   Cardiovascular: Negative.   Gastrointestinal: Negative.   Genitourinary: Negative.   Musculoskeletal: Negative.   Skin: Negative.   Neurological: Negative.   Endo/Heme/Allergies: Negative.   Psychiatric/Behavioral: Negative.      Physical Exam:  weight is 217 lb (98.4 kg). His oral temperature is 97.5 F (36.4 C) (abnormal). His blood pressure is 132/80 and his pulse is 56 (abnormal). His respiration is 18 and oxygen saturation is 97%.   Wt Readings from Last 3 Encounters:  06/05/18 217 lb (98.4 kg)  03/23/18 212 lb (96.2 kg)  03/13/18 210 lb (95.3 kg)     Physical Exam  Constitutional: He is oriented to person, place, and time.  HENT:  Head: Normocephalic and atraumatic.  Mouth/Throat: Oropharynx is clear and moist.  Eyes: Pupils are equal, round, and reactive to light. EOM are normal.  Neck: Normal range of motion.  Cardiovascular: Normal rate, regular rhythm and normal heart sounds.  Pulmonary/Chest: Effort normal and breath sounds normal.  Abdominal: Soft. Bowel sounds are normal.  Musculoskeletal: Normal range of motion. He exhibits no edema, tenderness or deformity.  Lymphadenopathy:    He has no cervical adenopathy.  Neurological: He is alert and oriented to person, place, and time.  Skin: Skin is warm and dry. No rash noted. No erythema.  Psychiatric:  He has a normal mood and affect. His behavior is normal. Judgment and thought content normal.  Vitals reviewed. Is Lab Results  Component Value Date   WBC 4.7 06/05/2018   HGB 13.9 06/05/2018   HCT 41.9 06/05/2018   MCV 101.7 (H) 06/05/2018   PLT 187 06/05/2018     Chemistry      Component Value Date/Time   NA 143 06/05/2018 1114   NA 144 01/03/2017 1114   K 4.6 06/05/2018 1114   K 4.5 01/03/2017 1114   CL 106 06/05/2018 1114   CL 108 01/03/2017 1114   CO2 31 06/05/2018 1114   CO2 32 01/03/2017 1114   BUN 14 06/05/2018 1114   BUN 13 01/03/2017 1114   CREATININE  1.00 06/05/2018 1114   CREATININE 1.1 01/03/2017 1114      Component Value Date/Time   CALCIUM 8.9 06/05/2018 1114   CALCIUM 9.1 01/03/2017 1114   ALKPHOS 127 (H) 06/05/2018 1114   ALKPHOS 125 (H) 01/03/2017 1114   AST 24 06/05/2018 1114   ALT 21 06/05/2018 1114   ALT 22 01/03/2017 1114   BILITOT 0.7 06/05/2018 1114         Impression and Plan: Adam Melton is a 77 year old white male. He developed a thrombus in the right lower leg. He has had prior thromboembolic disease. He was taken off blood thinner with Coumadin and then developed another thrombus.  I will plan to see him back in the springtime.  I think this would be reasonable.  We can easily get him through the winter and the holidays.  I think that as long as he is doing well with the 5 mg dose of Xarelto, then we should not have any problems.  [name], MD 11/22/201912:42 PM

## 2018-06-17 ENCOUNTER — Ambulatory Visit (INDEPENDENT_AMBULATORY_CARE_PROVIDER_SITE_OTHER): Payer: Medicare Other | Admitting: Family Medicine

## 2018-06-17 ENCOUNTER — Encounter: Payer: Self-pay | Admitting: Family Medicine

## 2018-06-17 VITALS — BP 129/76 | HR 53 | Temp 98.0°F | Resp 16 | Ht 72.0 in | Wt 213.2 lb

## 2018-06-17 DIAGNOSIS — I82402 Acute embolism and thrombosis of unspecified deep veins of left lower extremity: Secondary | ICD-10-CM

## 2018-06-17 DIAGNOSIS — G40909 Epilepsy, unspecified, not intractable, without status epilepticus: Secondary | ICD-10-CM

## 2018-06-17 DIAGNOSIS — I872 Venous insufficiency (chronic) (peripheral): Secondary | ICD-10-CM | POA: Diagnosis not present

## 2018-06-17 DIAGNOSIS — E7800 Pure hypercholesterolemia, unspecified: Secondary | ICD-10-CM

## 2018-06-17 DIAGNOSIS — E663 Overweight: Secondary | ICD-10-CM | POA: Diagnosis not present

## 2018-06-17 LAB — LIPID PANEL
Cholesterol: 227 mg/dL — ABNORMAL HIGH (ref 0–200)
HDL: 52.2 mg/dL (ref >39.00)
LDL Cholesterol: 150 mg/dL — ABNORMAL HIGH (ref 0–99)
NonHDL: 175.11
Total CHOL/HDL Ratio: 4
Triglycerides: 127 mg/dL (ref 0.0–149.0)
VLDL: 25.4 mg/dL (ref 0.0–40.0)

## 2018-06-17 NOTE — Progress Notes (Signed)
OFFICE VISIT  06/17/2018   CC:  Chief Complaint  Patient presents with  . Follow-up    RCI, pt is fasting.    HPI:    Patient is a 77 y.o. Caucasian male who presents for 6 mo f/u seizure disorder, HLD, and recurrent DVT (chronic anticoagulation).  He had f/u with Dr. Marin Olp for his recurrent DVT and was kept on 5mg  xarelto qd, which he will be on indefinitely.  Last f/u visit his LDL was up quite a bit so I recommended he try to get back on his crestor a couple of days a week.  He wanted to work on diet, was transitioning to vegetarian diet at that time.  Interim hx: No seizures.  Taking dilantin as rx'd.  Level checks in the past have been stable, no requirement for dose change. No leg pain or acute swelling. Not much ground made with dietary changes but he is interested in [place] today.  Not taking the statin-->he has made decision to try to avoid these meds.  Past Medical History:  Diagnosis Date  . Anisocoria    R pupil larger than L  . Anxiety   . Baker's cyst of knee 03/2016   Right  . Cataract    Bilat; no surgery  . Colon polyps 11/2008 & 2017  . Diverticulosis 11/2008  . DVT of lower limb, acute ([place]) 03/19/2016   (recurrent-->first one was L saph vein in 2015.  2017 Right gastroc (xarelto started).  Full thrombophilia panel by Dr. Marin Olp 09/2016 was NORMAL.  Dr. Marin Olp recommended low dose xarelto continuation in order to decrease the risk of recurrent DVT (finished 1 yr of full dose anticoag 03/2017).  Xarelto 5mg  qd indefinitely as per 03/2018 hem f/u.  Marland Kitchen Glaucoma    Primary open angle glaucoma OU, mild stage [name], MD)-stable as of 09/2017 ophth f/u.  Marland Kitchen Hyperlipidemia 11/2015  . 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 Lung nodule 10/2014   calcified granulomata  . Nephrolithiasis   . Osteopenia 2009   By DEXA  . Prostate cancer ([place])    robotic  prostectomy   . 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 Ptosis, paralytic, right   . 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  Last seizure 2007.  Marland Kitchen Skull fracture ([name])    > 30 years ago; now with seizure d/o  . 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 Thrombosis of saphenous vein 2015   Left; PCP in [place] put him on xarelto x 3 mo.  F/u ultrasound was NORMAL.  . 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 CARPAL TUNNEL RELEASE Right   . CARPAL TUNNEL RELEASE    . COLONOSCOPY W/ POLYPECTOMY  12/02/08; 10/12/15   Recall 3 yrs (Dr. Henrene Pastor)  . DEXA  09/02/07   Osteopenia  . INCISION AND DRAINAGE Left    knee  . LITHOTRIPSY    . ROBOT ASSISTED LAPAROSCOPIC RADICAL PROSTATECTOMY  2008  . SUBDURAL HEMATOMA EVACUATION VIA CRANIOTOMY     + temporal intracerebral hematoma--surgical drainage  . 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    Outpatient Medications Prior to Visit  Medication Sig Dispense Refill  . busPIRone (BUSPAR) 5 MG tablet Take 1 tablet (5 mg total) by mouth daily. 90 tablet 1  . Cholecalciferol (VITAMIN D PO) Take by mouth.    Marland Kitchen DILANTIN 100 MG ER capsule TAKE TWO CAPSULES BY MOUTH EVERY MORNING AND TAKE THREE CAPSULES AT BEDTIME 450 capsule 1  . latanoprost (XALATAN) 0.005 % ophthalmic solution instill one drop in EACH eye AT BEDTIME  5  . Multiple Vitamins-Minerals (HM MULTIVITAMIN ADULT GUMMY PO) Take by mouth.    . rivaroxaban (XARELTO) 2.5 MG TABS tablet Take 2 tablets (5 mg total) by mouth daily. 60 tablet 5  . timolol (TIMOPTIC) 0.5 % ophthalmic solution 1 drop 2 (two) times daily.     No facility-administered medications prior to visit.     Allergies  Allergen Reactions  . Azithromycin Other (See Comments)    seizure  . Penicillins Nausea And Vomiting and  Rash  . Shellfish Allergy Swelling    ROS As per HPI  PE: Blood pressure 129/76, pulse (!) 53, temperature 98 F (36.7 C), temperature source Oral, resp. rate 16, height 6' (1.829 m), weight 213 lb 4 oz (96.7 kg), SpO2 95 %. Gen: Alert, well appearing.  Patient is oriented to person, place, time, and situation. AFFECT: pleasant, lucid thought and speech. CV: RRR, no m/r/g.   LUNGS: CTA bilat, nonlabored resps, good aeration in all lung fields. EXT: no clubbing or cyanosis.  He has 1+ pitting in L ankle.  A bit of nonpitting edema in R LL with mild diffuse varicosities in LL's bilat.  No erythema or tenderness.  LABS:  Lab Results  Component Value Date   TSH 2.12 11/16/2015   Lab Results  Component Value Date   WBC 4.7 06/05/2018   HGB 13.9 06/05/2018   HCT 41.9 06/05/2018   MCV 101.7 (H) 06/05/2018   PLT 187 06/05/2018   Lab Results  Component Value Date   CREATININE 1.00 06/05/2018   BUN 14 06/05/2018   NA 143 06/05/2018   K 4.6 06/05/2018   CL 106 06/05/2018   CO2 31 06/05/2018   Lab Results  Component Value Date   ALT 21 06/05/2018   AST 24 06/05/2018   ALKPHOS 127 (H) 06/05/2018   BILITOT 0.7 06/05/2018   Lab Results  Component Value Date   CHOL 232 (H) 12/18/2017   Lab Results  Component Value Date   HDL 47.30 12/18/2017   Lab Results  Component Value Date   LDLCALC 166 (H) 12/18/2017   Lab Results  Component Value Date   TRIG 94.0 12/18/2017   Lab Results  Component Value Date   CHOLHDL 5 12/18/2017   Lab Results  Component Value Date   PSA 0.01 (L) 12/18/2017   PSA 0.00 Repeated and verified X2. (L) 12/23/2016   PSA 0.00 Repeated and verified X2. (L) 11/16/2015    IMPRESSION AND PLAN:   1) Recurrent DVT: normal CBC and CMET 06/05/18. No repeat of these labs today. 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  Discussed low  Na diet and elevation of legs regularly.  2) Seizure d/o: The current medical regimen is effective;  continue present plan and medications. Dilantin level check NEXT f/u in 6 mo.  3) Hyperlipidemia: he chooses NOT to take statin medication due to fear of possible ong term effects.  An After Visit Summary was printed and given  to the patient.  FOLLOW UP: Return in about 6 months (around 12/17/2018) for routine chronic illness f/u.  Signed:  Crissie Sickles, MD           06/17/2018

## 2018-08-07 ENCOUNTER — Other Ambulatory Visit: Payer: Self-pay | Admitting: Family Medicine

## 2018-08-07 NOTE — Telephone Encounter (Signed)
RF request for buspar LOV: 06/17/18 Next ov: 12/22/18 Last written: 12/31/17 #90 w/ 1RF  Please advise. Thanks.

## 2018-08-15 ENCOUNTER — Other Ambulatory Visit: Payer: Self-pay | Admitting: Family Medicine

## 2018-08-17 NOTE — Telephone Encounter (Signed)
RF request for dilantin LOV: 06/17/18 Next ov: 12/22/18 Last written: 02/23/18 #450 w/ 1RF  Please advise. Thanks.

## 2018-08-26 DIAGNOSIS — Z85828 Personal history of other malignant neoplasm of skin: Secondary | ICD-10-CM | POA: Diagnosis not present

## 2018-08-26 DIAGNOSIS — L821 Other seborrheic keratosis: Secondary | ICD-10-CM | POA: Diagnosis not present

## 2018-08-26 DIAGNOSIS — D225 Melanocytic nevi of trunk: Secondary | ICD-10-CM | POA: Diagnosis not present

## 2018-08-26 DIAGNOSIS — L570 Actinic keratosis: Secondary | ICD-10-CM | POA: Diagnosis not present

## 2018-08-26 DIAGNOSIS — Z08 Encounter for follow-up examination after completed treatment for malignant neoplasm: Secondary | ICD-10-CM | POA: Diagnosis not present

## 2018-09-14 ENCOUNTER — Other Ambulatory Visit: Payer: Self-pay | Admitting: Hematology & Oncology

## 2018-09-29 DIAGNOSIS — H25813 Combined forms of age-related cataract, bilateral: Secondary | ICD-10-CM | POA: Diagnosis not present

## 2018-09-29 DIAGNOSIS — H401131 Primary open-angle glaucoma, bilateral, mild stage: Secondary | ICD-10-CM | POA: Diagnosis not present

## 2018-10-01 ENCOUNTER — Encounter: Payer: Self-pay | Admitting: Internal Medicine

## 2018-10-02 ENCOUNTER — Other Ambulatory Visit: Payer: Medicare Other

## 2018-10-02 ENCOUNTER — Ambulatory Visit: Payer: Medicare Other | Admitting: Hematology & Oncology

## 2018-10-06 ENCOUNTER — Inpatient Hospital Stay: Payer: Medicare Other | Admitting: Hematology & Oncology

## 2018-10-06 ENCOUNTER — Inpatient Hospital Stay: Payer: Medicare Other

## 2018-11-04 ENCOUNTER — Inpatient Hospital Stay (HOSPITAL_BASED_OUTPATIENT_CLINIC_OR_DEPARTMENT_OTHER): Payer: Medicare Other | Admitting: Hematology & Oncology

## 2018-11-04 ENCOUNTER — Other Ambulatory Visit: Payer: Self-pay

## 2018-11-04 ENCOUNTER — Inpatient Hospital Stay: Payer: Medicare Other | Attending: Hematology & Oncology

## 2018-11-04 VITALS — BP 150/78 | HR 64 | Resp 18 | Wt 216.5 lb

## 2018-11-04 DIAGNOSIS — I825Z1 Chronic embolism and thrombosis of unspecified deep veins of right distal lower extremity: Secondary | ICD-10-CM

## 2018-11-04 DIAGNOSIS — Z86718 Personal history of other venous thrombosis and embolism: Secondary | ICD-10-CM | POA: Diagnosis not present

## 2018-11-04 DIAGNOSIS — Z7901 Long term (current) use of anticoagulants: Secondary | ICD-10-CM | POA: Insufficient documentation

## 2018-11-04 LAB — CMP (CANCER CENTER ONLY)
ALT: 9 U/L (ref 0–44)
AST: 15 U/L (ref 15–41)
Albumin: 4.4 g/dL (ref 3.5–5.0)
Alkaline Phosphatase: 131 U/L — ABNORMAL HIGH (ref 38–126)
Anion gap: 9 (ref 5–15)
BUN: 16 mg/dL (ref 8–23)
CO2: 28 mmol/L (ref 22–32)
Calcium: 9.1 mg/dL (ref 8.9–10.3)
Chloride: 103 mmol/L (ref 98–111)
Creatinine: 0.98 mg/dL (ref 0.61–1.24)
GFR, Est AFR Am: >60 mL/min (ref >60)
GFR, Estimated: >60 mL/min (ref >60)
Glucose, Bld: 107 mg/dL — ABNORMAL HIGH (ref 70–99)
Potassium: 4.7 mmol/L (ref 3.5–5.1)
Sodium: 140 mmol/L (ref 135–145)
Total Bilirubin: 0.5 mg/dL (ref 0.3–1.2)
Total Protein: 6.3 g/dL — ABNORMAL LOW (ref 6.5–8.1)

## 2018-11-04 LAB — CBC WITH DIFFERENTIAL (CANCER CENTER ONLY)
Abs Immature Granulocytes: 0.03 10*3/uL (ref 0.00–0.07)
Basophils Absolute: 0.1 10*3/uL (ref 0.0–0.1)
Basophils Relative: 1 %
Eosinophils Absolute: 0.3 10*3/uL (ref 0.0–0.5)
Eosinophils Relative: 5 %
HCT: 42.8 % (ref 39.0–52.0)
Hemoglobin: 14.8 g/dL (ref 13.0–17.0)
Immature Granulocytes: 1 %
Lymphocytes Relative: 23 %
Lymphs Abs: 1.2 10*3/uL (ref 0.7–4.0)
MCH: 34.7 pg — ABNORMAL HIGH (ref 26.0–34.0)
MCHC: 34.6 g/dL (ref 30.0–36.0)
MCV: 100.2 fL — ABNORMAL HIGH (ref 80.0–100.0)
Monocytes Absolute: 0.6 10*3/uL (ref 0.1–1.0)
Monocytes Relative: 11 %
Neutro Abs: 3.2 10*3/uL (ref 1.7–7.7)
Neutrophils Relative %: 59 %
Platelet Count: 190 10*3/uL (ref 150–400)
RBC: 4.27 MIL/uL (ref 4.22–5.81)
RDW: 12.9 % (ref 11.5–15.5)
WBC Count: 5.4 10*3/uL (ref 4.0–10.5)
nRBC: 0 % (ref 0.0–0.2)

## 2018-11-04 NOTE — Progress Notes (Signed)
Hematology and Oncology Follow Up Visit  Adam Melton [id number] September 11, 1940 78 y.o. 11/04/2018   Principle Diagnosis:   DVT of the right gastrocnemius vein --recurrent  Current Therapy:   Xarelto 5 mg by mouth daily - maintanence --lifelong    Interim History:  Adam Melton is back for follow-up.  He is doing pretty well.  We saw him 6 months ago.  He is getting through the coronavirus.  He really is not able to do all that much.  He just stays home for the most part.  He has had no issues with nausea or vomiting.  He has had no bleeding.  He is on the 2.5 mg twice a day dose of Xarelto.  This is more amenable to his finances.  He has had no leg pain.  His last Doppler that was done in his legs last year did not show any evidence of residual thrombus in either leg.  He has had no cough.  There is no chest wall pain.  He has had no nausea or vomiting.  Overall, his performance status is ECOG 0.  Medications:  Current Outpatient Medications:  .  busPIRone (BUSPAR) 5 MG tablet, Take 1 tablet (5 mg total) by mouth daily., Disp: 90 tablet, Rfl: 1 .  Cholecalciferol (VITAMIN D PO), Take by mouth., Disp: , Rfl:  .  DILANTIN 100 MG ER capsule, TAKE TWO CAPSULES BY MOUTH EVERY MORNING AND TAKE THREE CAPSULES AT BEDTIME, Disp: 450 capsule, Rfl: 1 .  latanoprost (XALATAN) 0.005 % ophthalmic solution, instill one drop in EACH eye AT BEDTIME, Disp: , Rfl: 5 .  Multiple Vitamins-Minerals (HM MULTIVITAMIN ADULT GUMMY PO), Take by mouth., Disp: , Rfl:  .  timolol (TIMOPTIC) 0.5 % ophthalmic solution, 1 drop 2 (two) times daily., Disp: , Rfl:  .  XARELTO 2.5 MG TABS tablet, Take 2 tablets (5 mg total) by mouth daily., Disp: 60 tablet, Rfl: 5  Allergies:  Allergies  Allergen Reactions  . Azithromycin Other (See Comments)    seizure  . Penicillins Nausea And Vomiting and Rash  . Shellfish Allergy Swelling    Past Medical History, Surgical history, Social history, and Family History were  reviewed and updated.  Review of Systems: Review of Systems  Constitutional: Negative.   HENT: Negative.   Eyes: Negative.   Respiratory: Negative.   Cardiovascular: Negative.   Gastrointestinal: Negative.   Genitourinary: Negative.   Musculoskeletal: Negative.   Skin: Negative.   Neurological: Negative.   Endo/Heme/Allergies: Negative.   Psychiatric/Behavioral: Negative.      Physical Exam:  weight is 216 lb 8 oz (98.2 kg). His blood pressure is 150/78 (abnormal) and his pulse is 64. His respiration is 18.   Wt Readings from Last 3 Encounters:  11/04/18 216 lb 8 oz (98.2 kg)  06/17/18 213 lb 4 oz (96.7 kg)  06/05/18 217 lb (98.4 kg)     Physical Exam Vitals signs reviewed.  HENT:     Head: Normocephalic and atraumatic.  Eyes:     Pupils: Pupils are equal, round, and reactive to light.  Neck:     Musculoskeletal: Normal range of motion.  Cardiovascular:     Rate and Rhythm: Normal rate and regular rhythm.     Heart sounds: Normal heart sounds.  Pulmonary:     Effort: Pulmonary effort is normal.     Breath sounds: Normal breath sounds.  Abdominal:     General: Bowel sounds are normal.     Palpations: Abdomen is soft.  Musculoskeletal: Normal range of motion.        General: No tenderness or deformity.  Lymphadenopathy:     Cervical: No cervical adenopathy.  Skin:    General: Skin is warm and dry.     Findings: No erythema or rash.  Neurological:     Mental Status: He is alert and oriented to person, place, and time.  Psychiatric:        Behavior: Behavior normal.        Thought Content: Thought content normal.        Judgment: Judgment normal.   Is Lab Results  Component Value Date   WBC 5.4 11/04/2018   HGB 14.8 11/04/2018   HCT 42.8 11/04/2018   MCV 100.2 (H) 11/04/2018   PLT 190 11/04/2018     Chemistry      Component Value Date/Time   NA 140 11/04/2018 1344   NA 144 01/03/2017 1114   K 4.7 11/04/2018 1344   K 4.5 01/03/2017 1114   CL 103  11/04/2018 1344   CL 108 01/03/2017 1114   CO2 28 11/04/2018 1344   CO2 32 01/03/2017 1114   BUN 16 11/04/2018 1344   BUN 13 01/03/2017 1114   CREATININE 0.98 11/04/2018 1344   CREATININE 1.1 01/03/2017 1114      Component Value Date/Time   CALCIUM 9.1 11/04/2018 1344   CALCIUM 9.1 01/03/2017 1114   ALKPHOS 131 (H) 11/04/2018 1344   ALKPHOS 125 (H) 01/03/2017 1114   AST 15 11/04/2018 1344   ALT 9 11/04/2018 1344   ALT 22 01/03/2017 1114   BILITOT 0.5 11/04/2018 1344         Impression and Plan: Adam Melton is a 78 year old white male. He developed a thrombus in the right lower leg. He has had prior thromboembolic disease. He was taken off blood thinner with Coumadin and then developed another thrombus.  Again, he is on lifelong anticoagulation from my point of view.  I think 5 mg is appropriate for him.  I just do not think that this should not cause any problems and that it should be effective.  I will plan to see him back in 6 more months.    [name], MD 4/22/20202:53 PM

## 2018-11-05 ENCOUNTER — Telehealth: Payer: Self-pay | Admitting: Hematology & Oncology

## 2018-11-05 NOTE — Telephone Encounter (Signed)
sw pt wife to confirm 10/22 appt at 1 pm per 4/22 los

## 2018-11-11 ENCOUNTER — Other Ambulatory Visit: Payer: Self-pay

## 2018-11-11 ENCOUNTER — Encounter: Payer: Self-pay | Admitting: Family Medicine

## 2018-11-11 ENCOUNTER — Ambulatory Visit (INDEPENDENT_AMBULATORY_CARE_PROVIDER_SITE_OTHER): Payer: Medicare Other | Admitting: Family Medicine

## 2018-11-11 VITALS — Temp 98.6°F | Ht 72.0 in | Wt 207.0 lb

## 2018-11-11 DIAGNOSIS — M542 Cervicalgia: Secondary | ICD-10-CM

## 2018-11-11 MED ORDER — CYCLOBENZAPRINE HCL 5 MG PO TABS: 5.0000 mg | ORAL_TABLET | Freq: Two times a day (BID) | ORAL | 0 refills | Status: DC | PRN

## 2018-11-11 NOTE — Patient Instructions (Signed)
AVOID NSAIDS - tylenol ok, since he is on a blood thinner.  -Heating pad a few times a day, no longer than 15 minutes at a time. -Light stretches a few times a day.  Light massage can be helpful. -Low-dose Flexeril  to start before bed, can take one other time during the day as long as increased sedation does not occur. -Follow-up with PCP in 1-2 weeks if symptoms are not improving, sooner if worsening.    Muscle Pain, Adult Muscle pain (myalgia) may be mild or severe. In most cases, the pain lasts only a short time and it goes away without treatment. It is normal to feel some muscle pain after starting a workout program. Muscles that have not been used often will be sore at first. Muscle pain may also be caused by many other things, including:  Overuse or muscle strain, especially if you are not in shape. This is the most common cause of muscle pain.  Injury.  Bruises.  Viruses, such as the flu.  Infectious diseases.  A chronic condition that causes muscle tenderness, fatigue, and headache (fibromyalgia).  A condition, such as lupus, in which the body's disease-fighting system attacks other organs in the body (autoimmune or rheumatologic diseases).  Certain drugs, including ACE inhibitors and statins. To diagnose the cause of your muscle pain, your health care provider will do a physical exam and ask questions about the pain and when it began. If you have not had muscle pain for very long, your health care provider may want to wait before doing much testing. If your muscle pain has lasted a long time, your health care provider may want to run tests right away. In some cases, this may include tests to rule out certain conditions or illnesses. Treatment for muscle pain depends on the cause. Home care is often enough to relieve muscle pain. Your health care provider may also prescribe anti-inflammatory medicine. Follow these instructions at home: Activity  If overuse is causing your  muscle pain: ? Slow down your activities until the pain goes away. ? Do regular, gentle exercises if you are not usually active. ? Warm up before exercising. Stretch before and after exercising. This can help lower the risk of muscle pain.  Do not continue working out if the pain is very bad. Bad pain could mean that you have injured a muscle. Managing pain and discomfort   If directed, apply ice to the sore muscle: ? Put ice in a plastic bag. ? Place a towel between your skin and the bag. ? Leave the ice on for 20 minutes, 2-3 times a day.  You may also alternate between applying ice and applying heat as told by your health care provider. To apply heat, use the heat source that your health care provider recommends, such as a moist heat pack or a heating pad. ? Place a towel between your skin and the heat source. ? Leave the heat on for 20-30 minutes. ? Remove the heat if your skin turns bright red. This is especially important if you are unable to feel pain, heat, or cold. You may have a greater risk of getting burned. Medicines  Take over-the-counter and prescription medicines only as told by your health care provider.  Do not drive or use heavy machinery while taking prescription pain medicine. Contact a health care provider if:  Your muscle pain gets worse and medicines do not help.  You have muscle pain that lasts longer than 3 days.  You  have a rash or fever along with muscle pain.  You have muscle pain after a tick bite.  You have muscle pain while working out, even though you are in good physical condition.  You have redness, soreness, or swelling along with muscle pain.  You have muscle pain after starting a new medicine or changing the dose of a medicine. Get help right away if:  You have trouble breathing.  You have trouble swallowing.  You have muscle pain along with a stiff neck, fever, and vomiting.  You have severe muscle weakness or cannot move part of  your body. This information is not intended to replace advice given to you by your health care provider. Make sure you discuss any questions you have with your health care provider. Document Released: 05/23/2006 Document Revised: 01/19/2016 Document Reviewed: 11/21/2015 Elsevier Interactive Patient Education  2019 [employer].

## 2018-11-11 NOTE — Progress Notes (Signed)
VIRTUAL VISIT VIA VIDEO  I connected with Adam Melton on 11/11/18 at  9:20 AM EDT by a video enabled telemedicine application and verified that I am speaking with the correct person using two identifiers. Location patient: Home Location provider: [facility], Office Persons participating in the virtual visit: Patient, Dr. Raoul Pitch and R.Baker, LPN  I discussed the limitations of evaluation and management by telemedicine and the availability of in person appointments. The patient expressed understanding and agreed to proceed.   SUBJECTIVE Chief Complaint  Patient presents with  . Neck Pain    Neck pain since saturday morning. Denies injury. Hurts down the center of neck.     HPI:  Adam Melton is a 78 y.o. male presents today via virtual visit with his wife to discuss neck pain that started 5 days ago.  Patient states he woke up with the neck discomfort.  It originally started on the left upper portion of his neck/head, then moved to the right side of the same area and today is in the middle of his neck.  His wife states the pain is in the upper cervical/occipital area (by description).  Reports the pain as a dull ache seems to be worse in the morning or if he moves his head to look at something.  He reports when it was hurting on the right side and he would touch over the area he felt a knot.  That has resolved.  He denies any radiation of pain to his upper extremities.  He has tried a heating pad and ibuprofen.  Reports it did improve with ibuprofen use.  He denies any increase in activity or injury prior to onset of symptoms.  He reports having similar neck pain a few years ago and took some Tylenol and use a heating pad and it went away.  He has had no history of neck surgery.  He denies any fevers, chills, nausea, vomit, visual changes or headache.  He is eating and drinking his usual.  ROS: See pertinent positives and negatives per HPI.  Patient Active Problem List   Diagnosis Date Noted  . Lower leg DVT (deep venous thromboembolism), chronic, right ([place]) 01/03/2017  . Osteopenia 06/19/2015  . 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 Seizure disorder ([place]) 06/19/2015  . Glaucoma 06/19/2015    Social History   Tobacco Use  . Smoking status: Never Smoker  . Smokeless tobacco: Never Used  Substance Use Topics  . Alcohol use: No    Alcohol/week: 0.0 standard drinks    Current Outpatient Medications:  .  busPIRone (BUSPAR) 5 MG tablet, Take 1 tablet (5 mg total) by mouth daily., Disp: 90 tablet, Rfl: 1 .  Cholecalciferol (VITAMIN D PO), Take by mouth., Disp: , Rfl:  .  DILANTIN 100 MG ER capsule, TAKE TWO CAPSULES BY MOUTH EVERY MORNING AND TAKE THREE CAPSULES AT BEDTIME, Disp: 450 capsule, Rfl: 1 .  latanoprost (XALATAN) 0.005 % ophthalmic solution, instill one drop in EACH eye AT BEDTIME, Disp: , Rfl: 5 .  Multiple Vitamins-Minerals (HM MULTIVITAMIN ADULT GUMMY PO), Take by mouth., Disp: , Rfl:  .  timolol (TIMOPTIC) 0.5 % ophthalmic solution, 1 drop 2 (two) times daily., Disp: , Rfl:  .  XARELTO 2.5 MG TABS tablet, Take 2 tablets (5 mg total) by mouth daily., Disp: 60 tablet, Rfl: 5  Allergies  Allergen Reactions  . Azithromycin Other (See Comments)    seizure  . Penicillins Nausea And Vomiting and Rash  . Shellfish Allergy  Swelling    OBJECTIVE: Temp 98.6 F (37 C) (Oral)   Ht 6' (1.829 m)   Wt 207 lb (93.9 kg)   BMI 28.07 kg/m  Gen: No acute distress. Nontoxic in appearance.  HENT: AT. [place].  MMM.  Eyes:Pupils Equal Round Reactive to light, Extraocular movements intact,  Conjunctiva without redness, discharge or icterus. Chest: Cough or shortness of breath not present.  Neuro:  Alert. Oriented x3  MSK-cervical spine: No erythema, no obvious soft tissue swelling.  Full range of motion present.  Normal discomfort with neck flexion.  Moderate discomfort with extension and left rotation.  Neurovascularly intact distally  ASSESSMENT AND PLAN:  Adam Melton is a 78 y.o. male present for  Neck pain -Upper cervical/occipital pain of unknown etiology.  He did wake up with this discomfort, possibly slept on his neck incorrectly.  No known activity changes or injury prior to onset.  No red flags on exam today.  He does have discomfort with certain ranges of motion suggesting muscle skeletal cause of his discomfort. -Patient was advised to AVOID NSAIDS - tylenol ok, since he is on a blood thinner.  -Heating pad a few times a day, no longer than 15 minutes at a time. -Light stretches a few times a day, these were instructed to him.  Light massage can be helpful. -Low-dose Flexeril for him to start before bed, can take one other time during the day as long as increased sedation does not occur. -Follow-up with PCP in 1-2 weeks if symptoms are not improving, sooner if worsening.  > 25 minutes spent with patient, >50% of time spent face to face      Howard Pouch, DO 11/11/2018

## 2018-11-23 ENCOUNTER — Encounter: Payer: Self-pay | Admitting: Family Medicine

## 2018-12-08 ENCOUNTER — Telehealth: Payer: Self-pay | Admitting: *Deleted

## 2018-12-08 NOTE — Telephone Encounter (Signed)
LM to call us to schedule an OV  Prior to scheduling a colonoscopy as pt is on Xarelto-  Pt will have to have that [place] prior to [place] scheduling colon to get medication hold prior- Lelan Pons

## 2018-12-21 ENCOUNTER — Telehealth: Payer: Self-pay | Admitting: Family Medicine

## 2018-12-21 NOTE — Telephone Encounter (Signed)
Patient request for a virtual appt. He was initally scheduled for a 6 month follow up on 12/1718  Request for lab order. Patient schedule virtual appt to go over lab results and 6 month follow up **Patient is aware that Dr. Anitra Lauth out of office until 12/29/18.  Please advise

## 2018-12-22 ENCOUNTER — Ambulatory Visit: Payer: Medicare Other | Admitting: Family Medicine

## 2018-12-22 NOTE — Telephone Encounter (Signed)
Pt's original appt was cancelled. Spoke w/ pt's wife to reschedule for next week 6 month f/u [facility]

## 2018-12-30 ENCOUNTER — Ambulatory Visit: Payer: Medicare Other | Admitting: Family Medicine

## 2019-01-01 ENCOUNTER — Ambulatory Visit (INDEPENDENT_AMBULATORY_CARE_PROVIDER_SITE_OTHER): Payer: Medicare Other | Admitting: Family Medicine

## 2019-01-01 ENCOUNTER — Encounter: Payer: Self-pay | Admitting: Family Medicine

## 2019-01-01 ENCOUNTER — Other Ambulatory Visit (INDEPENDENT_AMBULATORY_CARE_PROVIDER_SITE_OTHER): Payer: Medicare Other

## 2019-01-01 ENCOUNTER — Other Ambulatory Visit: Payer: Self-pay

## 2019-01-01 VITALS — BP 134/84 | HR 65 | Temp 97.9°F | Resp 18 | Ht 72.0 in | Wt 211.4 lb

## 2019-01-01 DIAGNOSIS — R7301 Impaired fasting glucose: Secondary | ICD-10-CM | POA: Diagnosis not present

## 2019-01-01 DIAGNOSIS — E7800 Pure hypercholesterolemia, unspecified: Secondary | ICD-10-CM

## 2019-01-01 DIAGNOSIS — G40909 Epilepsy, unspecified, not intractable, without status epilepticus: Secondary | ICD-10-CM | POA: Diagnosis not present

## 2019-01-01 DIAGNOSIS — I82409 Acute embolism and thrombosis of unspecified deep veins of unspecified lower extremity: Secondary | ICD-10-CM

## 2019-01-01 DIAGNOSIS — Z7901 Long term (current) use of anticoagulants: Secondary | ICD-10-CM | POA: Diagnosis not present

## 2019-01-01 LAB — GLUCOSE, POCT (MANUAL RESULT ENTRY): POC Glucose: 107 mg/dl — AB (ref 70–99)

## 2019-01-01 NOTE — Patient Instructions (Signed)
Call [place] [facility] at [phone] to schedule a repeat colonoscopy with Dr. Henrene Pastor.

## 2019-01-01 NOTE — Progress Notes (Signed)
OFFICE VISIT  01/01/2019   CC:  Chief Complaint  Patient presents with  . Follow-up    [facility], pt is fasting    HPI:    Patient is a 78 y.o. Caucasian male who presents for 6 mo f/u seizure disorder, borderline hypercholesterolemia, and hx of recurrent DVT.  Feeling well. Most recent hem/onc f/u was 11/04/18 and all was stable, no changes, labs stable. He has not had any seizures. He is working on low fat/low chol diet but doesn't exercise much.  He is an active person though.  No new LL pain or swelling.  No SOB. He is worried about getting diabetes and just inquires today "how close" he is to having it. We reviewed his labs: no hx of A1c check.  He has had some mild glucose elevations but not known whether these were fasting or not.    ROS: no CP, no SOB, no wheezing, no cough, no dizziness, no HAs, no rashes, no melena/hematochezia.  No polyuria or polydipsia.  No myalgias or arthralgias.  Past Medical History:  Diagnosis Date  . Anisocoria    R pupil larger than L  . Anxiety   . Baker's cyst of knee 03/2016   Right  . Cataract    Bilat; no surgery  . Colon polyps 11/2008 & 2017  . Diverticulosis 11/2008  . DVT of lower limb, acute ([place]) 03/19/2016   (recurrent-->first one was L saph vein in 2015.  2017 Right gastroc (xarelto started).  Full thrombophilia panel by Dr. Marin Olp 09/2016 was NORMAL.  Dr. Marin Olp recommended low dose xarelto continuation in order to decrease the risk of recurrent DVT (finished 1 yr of full dose anticoag 03/2017).  Xarelto 5mg  qd indefinitely as per 03/2018 and 11/2018 hem f/u.  Marland Kitchen Glaucoma    Primary open angle glaucoma OU, mild stage [name], MD)-stable as of 09/2017 ophth f/u.  Marland Kitchen Hyperlipidemia 11/2015  . 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 Lung nodule 10/2014   calcified granulomata  . Nephrolithiasis   . Osteopenia 2009   By  DEXA  . Prostate cancer ([place])    robotic prostectomy   . 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 Ptosis, paralytic, right   . 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  Last seizure 2007.  Marland Kitchen Skull fracture ([name])    > 30 years ago; now with seizure d/o  . 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 Thrombosis of saphenous vein 2015   Left; PCP in [place] put him on xarelto x 3 mo.  F/u ultrasound was NORMAL.  . 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 CARPAL TUNNEL RELEASE Right   . CARPAL TUNNEL RELEASE    . COLONOSCOPY W/ POLYPECTOMY  12/02/08; 10/12/15   Recall 3 yrs (Dr. Henrene Pastor)  . DEXA  09/02/07   Osteopenia  . INCISION AND DRAINAGE Left    knee  . LITHOTRIPSY    . ROBOT ASSISTED LAPAROSCOPIC RADICAL PROSTATECTOMY  2008  . SUBDURAL HEMATOMA EVACUATION VIA CRANIOTOMY     + temporal intracerebral hematoma--surgical drainage  . 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    Outpatient Medications Prior  to Visit  Medication Sig Dispense Refill  . busPIRone (BUSPAR) 5 MG tablet Take 1 tablet (5 mg total) by mouth daily. 90 tablet 1  . Cholecalciferol (VITAMIN D PO) Take by mouth.    Marland Kitchen DILANTIN 100 MG ER capsule TAKE TWO CAPSULES BY MOUTH EVERY MORNING AND TAKE THREE CAPSULES AT BEDTIME 450 capsule 1  . latanoprost (XALATAN) 0.005 % ophthalmic solution instill one drop in EACH eye AT BEDTIME  5  . Multiple Vitamins-Minerals (HM MULTIVITAMIN ADULT GUMMY PO) Take by mouth.    . timolol (TIMOPTIC) 0.5 % ophthalmic solution 1 drop 2 (two) times daily.    Alveda Reasons 2.5 MG TABS tablet Take 2 tablets (5 mg total) by mouth daily. 60 tablet 5  . cyclobenzaprine (FLEXERIL) 5 MG tablet Take 1 tablet (5 mg total) by mouth 2 (two) times daily as needed for muscle spasms. (Patient not taking:  Reported on 01/01/2019) 30 tablet 0   No facility-administered medications prior to visit.     Allergies  Allergen Reactions  . Azithromycin Other (See Comments)    seizure  . Penicillins Nausea And Vomiting and Rash  . Shellfish Allergy Swelling    ROS As per HPI  PE: Blood pressure 134/84, pulse 65, temperature 97.9 F (36.6 C), temperature source Temporal, resp. rate 18, height 6' (1.829 m), weight 211 lb 6.4 oz (95.9 kg), SpO2 97 %.   Gen: Alert, well appearing.  Patient is oriented to person, place, time, and situation. AFFECT: pleasant, lucid thought and speech. No icterus or jaundice or pallor.  LABS:   POC glucose today (fasting) was 107.  Lab Results  Component Value Date   TSH 2.12 11/16/2015   Lab Results  Component Value Date   WBC 5.4 11/04/2018   HGB 14.8 11/04/2018   HCT 42.8 11/04/2018   MCV 100.2 (H) 11/04/2018   PLT 190 11/04/2018   Lab Results  Component Value Date   CREATININE 0.98 11/04/2018   BUN 16 11/04/2018   NA 140 11/04/2018   K 4.7 11/04/2018   CL 103 11/04/2018   CO2 28 11/04/2018   Lab Results  Component Value Date   ALT 9 11/04/2018   AST 15 11/04/2018   ALKPHOS 131 (H) 11/04/2018   BILITOT 0.5 11/04/2018   Lab Results  Component Value Date   CHOL 227 (H) 06/17/2018   Lab Results  Component Value Date   HDL 52.20 06/17/2018   Lab Results  Component Value Date   LDLCALC 150 (H) 06/17/2018   Lab Results  Component Value Date   TRIG 127.0 06/17/2018   Lab Results  Component Value Date   CHOLHDL 4 06/17/2018   Lab Results  Component Value Date   PSA 0.01 (L) 12/18/2017   PSA 0.00 Repeated and verified X2. (L) 12/23/2016   PSA 0.00 Repeated and verified X2. (L) 11/16/2015   Most recent dilantin level was 12/23/16 and was 8.3.  No dose change made.  No results found for: HGBA1C  IMPRESSION AND PLAN:  1) Seizure d/o: no seizures/doing well on dilantin at current dosing regimen. Plan repeat dilantin level  (trough) at f/u in 6 mo.  2) HLD: He wishes to avoid statins.  Continue working on TLC. Plan repeat FLP 6 mo.  3) IFG: Hx of mild gluc elevations: unsure if fasting or not when these numbers were obtained.  Plan on HbA1c in 6 mo per his preference.  His fingerstick fasting glucose in office today was 107. No other labs  needed? ? Dilantin level now or at next f/u 6 mo?  4) Recurrent DVTs: doing well on 5mg  xarelto daily and will be on this indefinitely as per hem/onc MD.  5) Hx of colon polyp: due for repeat colonoscopy: I gave him info to contact [facility] to get this set up [phone].  An After Visit Summary was printed and given to the patient.  FOLLOW UP: Return in about 6 months (around 07/03/2019) for routine chronic illness f/u.   Signed:  Crissie Sickles, MD           01/01/2019

## 2019-01-01 NOTE — Progress Notes (Signed)
poct

## 2019-02-15 ENCOUNTER — Other Ambulatory Visit: Payer: Self-pay | Admitting: Family Medicine

## 2019-02-24 DIAGNOSIS — Z08 Encounter for follow-up examination after completed treatment for malignant neoplasm: Secondary | ICD-10-CM | POA: Diagnosis not present

## 2019-02-24 DIAGNOSIS — L821 Other seborrheic keratosis: Secondary | ICD-10-CM | POA: Diagnosis not present

## 2019-02-24 DIAGNOSIS — D225 Melanocytic nevi of trunk: Secondary | ICD-10-CM | POA: Diagnosis not present

## 2019-02-24 DIAGNOSIS — C44219 Basal cell carcinoma of skin of left ear and external auricular canal: Secondary | ICD-10-CM | POA: Diagnosis not present

## 2019-02-24 DIAGNOSIS — Z85828 Personal history of other malignant neoplasm of skin: Secondary | ICD-10-CM | POA: Diagnosis not present

## 2019-02-24 DIAGNOSIS — L570 Actinic keratosis: Secondary | ICD-10-CM | POA: Diagnosis not present

## 2019-03-07 ENCOUNTER — Other Ambulatory Visit: Payer: Self-pay | Admitting: Hematology & Oncology

## 2019-03-29 ENCOUNTER — Ambulatory Visit: Payer: Medicare Other

## 2019-03-29 ENCOUNTER — Other Ambulatory Visit: Payer: Self-pay

## 2019-03-29 ENCOUNTER — Ambulatory Visit (INDEPENDENT_AMBULATORY_CARE_PROVIDER_SITE_OTHER): Payer: Medicare Other

## 2019-03-29 ENCOUNTER — Telehealth: Payer: Self-pay

## 2019-03-29 VITALS — BP 130/68 | HR 68 | Ht 72.0 in | Wt 212.4 lb

## 2019-03-29 DIAGNOSIS — Z1211 Encounter for screening for malignant neoplasm of colon: Secondary | ICD-10-CM | POA: Diagnosis not present

## 2019-03-29 DIAGNOSIS — Z0000 Encounter for general adult medical examination without abnormal findings: Secondary | ICD-10-CM | POA: Diagnosis not present

## 2019-03-29 NOTE — Patient Instructions (Addendum)
Schedule colonoscopy consult.   Bring a copy of your living will and/or healthcare power of attorney to your next office visit.  Continue doing brain stimulating activities (puzzles, reading, adult coloring books, staying active) to keep memory sharp.    Fall Prevention in the Home, Adult Falls can cause injuries. They can happen to people of all ages. There are many things you can do to make your home safe and to help prevent falls. Ask for help when making these changes, if needed. What actions can I take to prevent falls? General Instructions  Use good lighting in all rooms. Replace any light bulbs that burn out.  Turn on the lights when you go into a dark area. Use night-lights.  Keep items that you use often in easy-to-reach places. Lower the shelves around your home if necessary.  Set up your furniture so you have a clear path. Avoid moving your furniture around.  Do not have throw rugs and other things on the floor that can make you trip.  Avoid walking on wet floors.  If any of your floors are uneven, fix them.  Add color or contrast paint or tape to clearly mark and help you see: ? Any grab bars or handrails. ? First and last steps of stairways. ? Where the edge of each step is.  If you use a stepladder: ? Make sure that it is fully opened. Do not climb a closed stepladder. ? Make sure that both sides of the stepladder are locked into place. ? Ask someone to hold the stepladder for you while you use it.  If there are any pets around you, be aware of where they are. What can I do in the bathroom?      Keep the floor dry. Clean up any water that spills onto the floor as soon as it happens.  Remove soap buildup in the tub or shower regularly.  Use non-skid mats or decals on the floor of the tub or shower.  Attach bath mats securely with double-sided, non-slip rug tape.  If you need to sit down in the shower, use a plastic, non-slip stool.  Install grab bars  by the toilet and in the tub and shower. Do not use towel bars as grab bars. What can I do in the bedroom?  Make sure that you have a light by your bed that is easy to reach.  Do not use any sheets or blankets that are too big for your bed. They should not hang down onto the floor.  Have a firm chair that has side arms. You can use this for support while you get dressed. What can I do in the kitchen?  Clean up any spills right away.  If you need to reach something above you, use a strong step stool that has a grab bar.  Keep electrical cords out of the way.  Do not use floor polish or wax that makes floors slippery. If you must use wax, use non-skid floor wax. What can I do with my stairs?  Do not leave any items on the stairs.  Make sure that you have a light switch at the top of the stairs and the bottom of the stairs. If you do not have them, ask someone to add them for you.  Make sure that there are handrails on both sides of the stairs, and use them. Fix handrails that are broken or loose. Make sure that handrails are as long as the stairways.  Install  non-slip stair treads on all stairs in your home.  Avoid having throw rugs at the top or bottom of the stairs. If you do have throw rugs, attach them to the floor with carpet tape.  Choose a carpet that does not hide the edge of the steps on the stairway.  Check any carpeting to make sure that it is firmly attached to the stairs. Fix any carpet that is loose or worn. What can I do on the outside of my home?  Use bright outdoor lighting.  Regularly fix the edges of walkways and driveways and fix any cracks.  Remove anything that might make you trip as you walk through a door, such as a raised step or threshold.  Trim any bushes or trees on the path to your home.  Regularly check to see if handrails are loose or broken. Make sure that both sides of any steps have handrails.  Install guardrails along the edges of any  raised decks and porches.  Clear walking paths of anything that might make someone trip, such as tools or rocks.  Have any leaves, snow, or ice cleared regularly.  Use sand or salt on walking paths during winter.  Clean up any spills in your garage right away. This includes grease or oil spills. What other actions can I take?  Wear shoes that: ? Have a low heel. Do not wear high heels. ? Have rubber bottoms. ? Are comfortable and fit you well. ? Are closed at the toe. Do not wear open-toe sandals.  Use tools that help you move around (mobility aids) if they are needed. These include: ? Canes. ? Walkers. ? Scooters. ? Crutches.  Review your medicines with your doctor. Some medicines can make you feel dizzy. This can increase your chance of falling. Ask your doctor what other things you can do to help prevent falls. Where to find more information  Centers for Disease Control and Prevention, STEADI: https://garcia.biz/  [employer] on Aging: BrainJudge.co.uk Contact a doctor if:  You are afraid of falling at home.  You feel weak, drowsy, or dizzy at home.  You fall at home. Summary  There are many simple things that you can do to make your home safe and to help prevent falls.  Ways to make your home safe include removing tripping hazards and installing grab bars in the bathroom.  Ask for help when making these changes in your home. This information is not intended to replace advice given to you by your health care provider. Make sure you discuss any questions you have with your health care provider. Document Released: 04/27/2009 Document Revised: 10/22/2018 Document Reviewed: 02/13/2017 Elsevier Patient Education  2020 [place] Maintenance, Male Adopting a healthy lifestyle and getting preventive care are important in promoting health and wellness. Ask your health care provider about:  The right schedule for you to have regular tests and  exams.  Things you can do on your own to prevent diseases and keep yourself healthy. What should I know about diet, weight, and exercise? Eat a healthy diet   Eat a diet that includes plenty of vegetables, fruits, low-fat dairy products, and lean protein.  Do not eat a lot of foods that are high in solid fats, added sugars, or sodium. Maintain a healthy weight Body mass index (BMI) is a measurement that can be used to identify possible weight problems. It estimates body fat based on height and weight. Your health care provider can help determine  your BMI and help you achieve or maintain a healthy weight. Get regular exercise Get regular exercise. This is one of the most important things you can do for your health. Most adults should:  Exercise for at least 150 minutes each week. The exercise should increase your heart rate and make you sweat (moderate-intensity exercise).  Do strengthening exercises at least twice a week. This is in addition to the moderate-intensity exercise.  Spend less time sitting. Even light physical activity can be beneficial. Watch cholesterol and blood lipids Have your blood tested for lipids and cholesterol at 77 years of age, then have this test every 5 years. You may need to have your cholesterol levels checked more often if:  Your lipid or cholesterol levels are high.  You are older than 77 years of age.  You are at high risk for heart disease. What should I know about cancer screening? Many types of cancers can be detected early and may often be prevented. Depending on your health history and family history, you may need to have cancer screening at various ages. This may include screening for:  Colorectal cancer.  Prostate cancer.  Skin cancer.  Lung cancer. What should I know about heart disease, diabetes, and high blood pressure? Blood pressure and heart disease  High blood pressure causes heart disease and increases the risk of stroke. This  is more likely to develop in people who have high blood pressure readings, are of African descent, or are overweight.  Talk with your health care provider about your target blood pressure readings.  Have your blood pressure checked: ? Every 3-5 years if you are 76-50 years of age. ? Every year if you are 57 years old or older.  If you are between the ages of 66 and 28 and are a current or former smoker, ask your health care provider if you should have a one-time screening for abdominal aortic aneurysm (AAA). Diabetes Have regular diabetes screenings. This checks your fasting blood sugar level. Have the screening done:  Once every three years after age 51 if you are at a normal weight and have a low risk for diabetes.  More often and at a younger age if you are overweight or have a high risk for diabetes. What should I know about preventing infection? Hepatitis B If you have a higher risk for hepatitis B, you should be screened for this virus. Talk with your health care provider to find out if you are at risk for hepatitis B infection. Hepatitis C Blood testing is recommended for:  Everyone born from [birth year] through 1964.  Anyone with known risk factors for hepatitis C. Sexually transmitted infections (STIs)  You should be screened each year for STIs, including gonorrhea and chlamydia, if: ? You are sexually active and are younger than 77 years of age. ? You are older than 78 years of age and your health care provider tells you that you are at risk for this type of infection. ? Your sexual activity has changed since you were last screened, and you are at increased risk for chlamydia or gonorrhea. Ask your health care provider if you are at risk.  Ask your health care provider about whether you are at high risk for HIV. Your health care provider may recommend a prescription medicine to help prevent HIV infection. If you choose to take medicine to prevent HIV, you should first get tested for  HIV. You should then be tested every 3 months for as long as  you are taking the medicine. Follow these instructions at home: Lifestyle  Do not use any products that contain nicotine or tobacco, such as cigarettes, e-cigarettes, and chewing tobacco. If you need help quitting, ask your health care provider.  Do not use street drugs.  Do not share needles.  Ask your health care provider for help if you need support or information about quitting drugs. Alcohol use  Do not drink alcohol if your health care provider tells you not to drink.  If you drink alcohol: ? Limit how much you have to 0-2 drinks a day. ? Be aware of how much alcohol is in your drink. In the [place] one drink equals one 12 oz bottle of beer (355 mL), one 5 oz glass of wine (148 mL), or one 1 oz glass of hard liquor (44 mL). General instructions  Schedule regular health, dental, and eye exams.  Stay current with your vaccines.  Tell your health care provider if: ? You often feel depressed. ? You have ever been abused or do not feel safe at home. Summary  Adopting a healthy lifestyle and getting preventive care are important in promoting health and wellness.  Follow your health care provider's instructions about healthy diet, exercising, and getting tested or screened for diseases.  Follow your health care provider's instructions on monitoring your cholesterol and blood pressure. This information is not intended to replace advice given to you by your health care provider. Make sure you discuss any questions you have with your health care provider. Document Released: 12/28/2007 Document Revised: 06/24/2018 Document Reviewed: 06/24/2018 Elsevier Patient Education  2020 [employer].

## 2019-03-29 NOTE — Progress Notes (Deleted)
Subjective:   Adam Melton is a 78 y.o. male who presents for Medicare Annual/Subsequent preventive examination.  Review of Systems:  No ROS.  Medicare Wellness Virtual Visit.  Visual/audio telehealth visit, UTA vital signs.   See social history for additional risk factors.      Sleep patterns: Home Safety/Smoke Alarms: Feels safe in home. Smoke alarms in place.  Living environment; residence and Firearm Safety: Lives with wife in apt on 2nd floor. Seat Belt Safety/Bike Helmet: Wears seat belt.    Male:   CCS-Colonoscopy 10/12/2015, polyp. Recall 3 years.   PSA-  Lab Results  Component Value Date   PSA 0.01 (L) 12/18/2017   PSA 0.00 Repeated and verified X2. (L) 12/23/2016   PSA 0.00 Repeated and verified X2. (L) 11/16/2015       Objective:    Vitals: There were no vitals taken for this visit.  There is no height or weight on file to calculate BMI.  Advanced Directives 06/05/2018 03/23/2018 03/13/2018 02/11/2018 08/14/2017 03/14/2017 09/27/2016  Does Patient Have a Medical Advance Directive? Yes Yes Yes Yes Yes Yes Yes  Type of Paramedic of [place];Living will [place];Living will [place];Living will [place];Living will Living will;Healthcare Power of [place];Living will [place];Living will  Does patient want to make changes to medical advance directive? No - Patient declined - - No - Patient declined No - Patient declined - -  Copy of [place] in Chart? No - copy requested Yes - Yes No - copy requested No - copy requested No - copy requested  Would patient like information on creating a medical advance directive? No - Patient declined - - - - - -    Tobacco Social History   Tobacco Use  Smoking Status Never Smoker  Smokeless Tobacco Never Used     Counseling given: Not Answered    Past Medical History:   Diagnosis Date  . Anisocoria    R pupil larger than L  . Anxiety   . Baker's cyst of knee 03/2016   Right  . Cataract    Bilat; no surgery  . Colon polyps 11/2008 & 2017  . Diverticulosis 11/2008  . 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  2017 Right gastroc (xarelto started).  Full thrombophilia panel by Dr. Marin Olp 09/2016 was NORMAL.  Dr. Marin Olp recommended low dose xarelto continuation in order to decrease the risk of recurrent DVT (finished 1 yr of full dose anticoag 03/2017).  Xarelto 5mg  qd indefinitely as per 03/2018 and 11/2018 hem f/u.  Marland Kitchen Glaucoma    Primary open angle glaucoma OU, mild stage [name], MD)-stable as of 09/2017 ophth f/u.  Marland Kitchen Hyperlipidemia 11/2015  . 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 Lung nodule 10/2014   calcified granulomata  . Nephrolithiasis   . Osteopenia 2009   By DEXA  . Prostate cancer ([place])    robotic prostectomy   . 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 Ptosis, paralytic, right   . 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  Last seizure 2007.  Marland Kitchen Skull fracture ([name])    > 30 years ago; now with seizure d/o  . 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  F/u ultrasound was NORMAL.  . 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 CARPAL TUNNEL RELEASE Right   . CARPAL TUNNEL RELEASE    . COLONOSCOPY W/ POLYPECTOMY  12/02/08; 10/12/15   Recall 3 yrs (Dr. Henrene Pastor)  . DEXA  09/02/07   Osteopenia  . INCISION AND DRAINAGE Left    knee  . LITHOTRIPSY    . ROBOT ASSISTED LAPAROSCOPIC RADICAL PROSTATECTOMY  2008  . SUBDURAL HEMATOMA EVACUATION VIA CRANIOTOMY     + temporal intracerebral  hematoma--surgical drainage  . 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   Family History  Problem Relation Age of Onset  . Stroke Mother   . Alcohol abuse Father   . Cancer Sister        Ovarian  . Heart disease Brother   . Diabetes Neg Hx   . Colon cancer Neg Hx    Social History   Socioeconomic History  . Marital status: Married    Spouse name: Not on file  . Number of children: Not on file  . Years of education: Not on file  . Highest education level: Not on file  Occupational History  . Not on file  Social Needs  . Financial resource strain: Not on file  . Food insecurity    Worry: Not on file    Inability: Not on file  . Transportation needs    Medical: Not on file    Non-medical: Not on file  Tobacco Use  . Smoking status: Never Smoker  . Smokeless tobacco: Never Used  Substance and Sexual Activity  . Alcohol use: No    Alcohol/week: 0.0 standard drinks  . Drug use: No  . Sexual activity: Not on file  Lifestyle  . Physical activity    Days per week: Not on file    Minutes per session: Not on file  . Stress: Not on file  Relationships  . Social Herbalist on phone: Not on file    Gets together: Not on file    Attends religious service: Not on file    Active member of club or organization: Not on file    Attends meetings of clubs or organizations: Not on file    Relationship status: Not on file  Other Topics Concern  . Not on file  Social History Narrative   Married, has one daughter and 3 grandchildren.   Relocated from [place], [place] 05/2015.   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    Outpatient Encounter Medications as of 03/29/2019  Medication Sig  . busPIRone (BUSPAR) 5 MG tablet Take 1 tablet (5 mg total) by mouth daily.  . Cholecalciferol (VITAMIN D PO) Take by mouth.  . cyclobenzaprine (FLEXERIL) 5 MG tablet Take 1 tablet (5 mg total) by mouth  2 (two) times daily as needed for muscle spasms. (Patient not taking: Reported on 01/01/2019)  . DILANTIN 100 MG ER capsule TAKE TWO CAPSULES BY MOUTH EVERY MORNING AND TAKE THREE CAPSULES AT BEDTIME  . latanoprost (XALATAN) 0.005 % ophthalmic solution instill one drop in [facility] eye AT BEDTIME  . Multiple Vitamins-Minerals (HM MULTIVITAMIN ADULT GUMMY PO) Take by mouth.  . timolol (TIMOPTIC)  0.5 % ophthalmic solution 1 drop 2 (two) times daily.  Alveda Reasons 2.5 MG TABS tablet Take 2 tablets (5 mg total) by mouth daily.   No facility-administered encounter medications on file as of 03/29/2019.     Activities of Daily Living No flowsheet data found.  Patient Care Team: Tammi Sou, MD as PCP - General (Family Medicine) Druscilla Brownie, MD as Consulting Physician (Dermatology) Latanya Maudlin, MD as Consulting Physician (Orthopedic Surgery) [name], MD as Consulting Physician (Ophthalmology) [name], MD as Consulting Physician (Oncology) Irene Shipper, MD as Consulting Physician (Gastroenterology)   Assessment:   This is a routine wellness examination for Adam Melton.  Exercise Activities and Dietary recommendations   Diet (meal preparation, eat out, water intake, caffeinated beverages, dairy products, fruits and vegetables):   Breakfast: Lunch:  Dinner:      Goals    . Weight (lb) < 185 lb (83.9 kg)     Lose weight by continuing low carb meals and walking.     . Weight (lb) < 195 lb (88.5 kg)     Lose weight, low carb and walking.        Fall Risk Fall Risk  01/01/2019 03/23/2018 03/14/2017 11/16/2015  Falls in the past year? 0 No No No  Number falls in past yr: 0 - - -  Injury with Fall? 0 - - -  Follow up Falls evaluation completed - - -     Depression Screen PHQ 2/9 Scores 01/01/2019 03/23/2018 03/14/2017 11/16/2015  PHQ - 2 Score 0 0 0 0    Cognitive Function MMSE - Mini Mental State Exam 03/23/2018 03/14/2017  Orientation to time 5 4  Orientation to Place 4  5  Registration 3 3  Attention/ Calculation 5 5  Recall 0 3  Language- name 2 objects 2 2  Language- repeat 1 1  Language- follow 3 step command 3 3  Language- read & follow direction 1 1  Write a sentence 1 1  Copy design 1 1  Total score 26 29        Immunization History  Administered Date(s) Administered  . Influenza, High Dose Seasonal PF 06/22/2015, 03/23/2018  . Pneumococcal Conjugate-13 11/16/2015  . Pneumococcal Polysaccharide-23 06/17/2017    Screening Tests Health Maintenance  Topic Date Due  . TETANUS/TDAP  03/03/1960  . COLONOSCOPY  10/12/2018  . INFLUENZA VACCINE  02/13/2019  . PNA vac Low Risk Adult  Completed        Plan:     I have personally reviewed and noted the following in the patient's chart:   . Medical and social history . Use of alcohol, tobacco or illicit drugs  . Current medications and supplements . Functional ability and status . Nutritional status . Physical activity . Advanced directives . List of other physicians . Hospitalizations, surgeries, and ER visits in previous 12 months . Vitals . Screenings to include cognitive, depression, and falls . Referrals and appointments  In addition, I have reviewed and discussed with patient certain preventive protocols, quality metrics, and best practice recommendations. A written personalized care plan for preventive services as well as general preventive health recommendations were provided to patient.     Gerilyn Nestle, RN  03/29/2019

## 2019-03-29 NOTE — Telephone Encounter (Signed)
Patient in for AWV today.  Patient and wife concerned about sleep apnea and dementia/memory loss (Scored 29/30 on MMSE).  Requesting referral for sleep study.  Telephone visit scheduled with PCP on 04/14/2019 to discuss.

## 2019-03-29 NOTE — Progress Notes (Addendum)
Subjective:   Zymir Englehart is a 78 y.o. male who presents for Medicare Annual/Subsequent preventive examination.  Review of Systems:  No ROS.  Medicare Wellness Visit.  See social history for additional risk factors.  Cardiac Risk Factors include: advanced age (>16men, >26 women);dyslipidemia;male gender;family history of premature cardiovascular disease   Sleep patterns: Sleeps 5 hours. Snores frequently, requesting sleep apnea study.  Home Safety/Smoke Alarms: Feels safe in home. Smoke alarms in place.  Living environment; residence and Firearm Safety:  Lives with wife in apt on 80rd floor. Uses elevator.  Seat Belt Safety/Bike Helmet: Wears seat belt.    Male:   CCS-Colonoscopy 10/12/2015, polyp. Recall 3 years. Referral placed.  PSA-  Lab Results  Component Value Date   PSA 0.01 (L) 12/18/2017   PSA 0.00 Repeated and verified X2. (L) 12/23/2016   PSA 0.00 Repeated and verified X2. (L) 11/16/2015       Objective:    Vitals: BP 130/68 (BP Location: Left Arm, Patient Position: Sitting, Cuff Size: Normal)   Pulse 68   Ht 6' (1.829 m)   SpO2 96%   BMI 28.67 kg/m   Body mass index is 28.67 kg/m.  Advanced Directives 03/29/2019 06/05/2018 03/23/2018 03/13/2018 02/11/2018 08/14/2017 03/14/2017  Does Patient Have a Medical Advance Directive? Yes Yes Yes Yes Yes Yes Yes  Type of Advance Directive Living will;Healthcare Power of [place];Living will [place];Living will [place];Living will [place];Living will Living will;Healthcare Power of [place];Living will  Does patient want to make changes to medical advance directive? - No - Patient declined - - No - Patient declined No - Patient declined -  Copy of [place] in Chart? No - copy requested No - copy requested Yes - Yes No - copy requested No - copy requested  Would patient like information  on creating a medical advance directive? - No - Patient declined - - - - -    Tobacco Social History   Tobacco Use  Smoking Status Never Smoker  Smokeless Tobacco Never Used     Counseling given: Not Answered    Past Medical History:  Diagnosis Date  . Anisocoria    R pupil larger than L  . Anxiety   . Baker's cyst of knee 03/2016   Right  . Cataract    Bilat; no surgery  . Colon polyps 11/2008 & 2017  . Diverticulosis 11/2008  . DVT of lower limb, acute ([place]) 03/19/2016   (recurrent-->first one was L saph vein in 2015.  2017 Right gastroc (xarelto started).  Full thrombophilia panel by Dr. Marin Olp 09/2016 was NORMAL.  Dr. Marin Olp recommended low dose xarelto continuation in order to decrease the risk of recurrent DVT (finished 1 yr of full dose anticoag 03/2017).  Xarelto 5mg  qd indefinitely as per 03/2018 and 11/2018 hem f/u.  Marland Kitchen Glaucoma    Primary open angle glaucoma OU, mild stage [name], MD)-stable as of 09/2017 ophth f/u.  Marland Kitchen Hyperlipidemia 11/2015  . 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 Lung nodule 10/2014   calcified granulomata  . Nephrolithiasis   . Osteopenia 2009   By DEXA  . 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 Ptosis, paralytic, right   . 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  Last seizure 2007.  Marland Kitchen Skull fracture ([name])    > 30 years ago; now with seizure d/o  . 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  F/u ultrasound was NORMAL.  . 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 CARPAL TUNNEL RELEASE Right   . CARPAL TUNNEL RELEASE    . COLONOSCOPY W/ POLYPECTOMY  12/02/08; 10/12/15   Recall  3 yrs (Dr. Henrene Pastor)  . DEXA  09/02/07   Osteopenia  . INCISION AND DRAINAGE Left    knee  . LITHOTRIPSY    . ROBOT ASSISTED LAPAROSCOPIC RADICAL PROSTATECTOMY  2008  . SUBDURAL HEMATOMA EVACUATION VIA CRANIOTOMY     + temporal intracerebral hematoma--surgical drainage  . 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   Family History  Problem Relation Age of Onset  . Stroke Mother   . Alcohol abuse Father   . Cancer Sister        Ovarian  . Heart disease Brother   . Diabetes Neg Hx   . Colon cancer Neg Hx    Social History   Socioeconomic History  . Marital status: Married    Spouse name: Not on file  . Number of children: Not on file  . Years of education: Not on file  . Highest education level: Not on file  Occupational History  . Not on file  Social Needs  . Financial resource strain: Not on file  . Food insecurity    Worry: Not on file    Inability: Not on file  . Transportation needs    Medical: Not on file    Non-medical: Not on file  Tobacco Use  . Smoking status: Never Smoker  . Smokeless tobacco: Never Used  Substance and Sexual Activity  . Alcohol use: No    Alcohol/week: 0.0 standard drinks  . Drug use: No  . Sexual activity: Not on file  Lifestyle  . Physical activity    Days per week: Not on file    Minutes per session: Not on file  . Stress: Not on file  Relationships  . Social Herbalist on phone: Not on file    Gets together: Not on file    Attends religious service: Not on file    Active member of club or organization: Not on file    Attends meetings of clubs or organizations: Not on file    Relationship status: Not on file  Other Topics Concern  . Not on file  Social History Narrative   Married, has one daughter and 3 grandchildren.   Relocated from [place], [place] 05/2015.   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    Outpatient Encounter  Medications as of 03/29/2019  Medication Sig  . busPIRone (BUSPAR) 5 MG tablet Take 1 tablet (5 mg total) by mouth daily.  . Cholecalciferol (VITAMIN D PO) Take by mouth.  . cyclobenzaprine (FLEXERIL) 5 MG tablet Take 1 tablet (5 mg total) by mouth 2 (two) times daily as needed for muscle spasms.  Marland Kitchen DILANTIN 100 MG ER capsule TAKE TWO CAPSULES BY MOUTH EVERY MORNING AND TAKE THREE CAPSULES AT BEDTIME  . latanoprost (  XALATAN) 0.005 % ophthalmic solution instill one drop in [facility] eye AT BEDTIME  . Multiple Vitamins-Minerals (HM MULTIVITAMIN ADULT GUMMY PO) Take by mouth.  . timolol (TIMOPTIC) 0.5 % ophthalmic solution 1 drop 2 (two) times daily.  Alveda Reasons 2.5 MG TABS tablet Take 2 tablets (5 mg total) by mouth daily.   No facility-administered encounter medications on file as of 03/29/2019.     Activities of Daily Living In your present state of health, do you have any difficulty performing the following activities: 03/29/2019  Hearing? Y  Vision? N  Difficulty concentrating or making decisions? N  Walking or climbing stairs? N  Dressing or bathing? N  Doing errands, shopping? N  Preparing Food and eating ? N  Using the Toilet? N  In the past six months, have you accidently leaked urine? N  Do you have problems with loss of bowel control? N  Managing your Medications? N  Managing your Finances? N  Housekeeping or managing your Housekeeping? N  Some recent data might be hidden    Patient Care Team: Tammi Sou, MD as PCP - General (Family Medicine) Druscilla Brownie, MD as Consulting Physician (Dermatology) Latanya Maudlin, MD as Consulting Physician (Orthopedic Surgery) [name], MD as Consulting Physician (Ophthalmology) [name], MD as Consulting Physician (Oncology) Irene Shipper, MD as Consulting Physician (Gastroenterology)   Assessment:   This is a routine wellness examination for [place].  Exercise Activities and Dietary recommendations Current  Exercise Habits: Home exercise routine, Type of exercise: walking, Time (Minutes): 30, Frequency (Times/Week): 3, Weekly Exercise (Minutes/Week): 90, Exercise limited by: neurologic condition(s)   Diet (meal preparation, eat out, water intake, caffeinated beverages, dairy products, fruits and vegetables): Drinks water. Occasional soda.   Eats heart healthy diet. 3 meals/day.   Goals    . Patient Stated     Maintain current health, write more.     . Weight (lb) < 185 lb (83.9 kg)     Lose weight by continuing low carb meals and walking.     . Weight (lb) < 195 lb (88.5 kg)     Lose weight, low carb and walking.        Fall Risk Fall Risk  03/29/2019 01/01/2019 03/23/2018 03/14/2017 11/16/2015  Falls in the past year? 1 0 No No No  Comment Rolled out of bed - - - -  Number falls in past yr: 0 0 - - -  Injury with Fall? 0 0 - - -  Risk for fall due to : Other (Comment) - - - -  Follow up Falls prevention discussed Falls evaluation completed - - -    Depression Screen PHQ 2/9 Scores 03/29/2019 01/01/2019 03/23/2018 03/14/2017  PHQ - 2 Score 0 0 0 0    Cognitive Function MMSE - Mini Mental State Exam 03/29/2019 03/23/2018 03/14/2017  Orientation to time 4 5 4   Orientation to Place 5 4 5   Registration 3 3 3   Attention/ Calculation 5 5 5   Recall 3 0 3  Language- name 2 objects 2 2 2   Language- repeat 1 1 1   Language- follow 3 step command 3 3 3   Language- read & follow direction 1 1 1   Write a sentence 1 1 1   Copy design 1 1 1   Total score 29 26 29         Immunization History  Administered Date(s) Administered  . Influenza, High Dose Seasonal PF 06/22/2015, 03/23/2018  . Pneumococcal Conjugate-13 11/16/2015  . Pneumococcal Polysaccharide-23  06/17/2017     Screening Tests Health Maintenance  Topic Date Due  . COLONOSCOPY  10/12/2018  . INFLUENZA VACCINE  04/15/2019 (Originally 02/13/2019)  . TETANUS/TDAP  03/28/2020 (Originally 03/03/1960)  . PNA vac Low Risk Adult  Completed     Declines Shingrix and influenza at this time.     Plan:    Schedule colonoscopy consult.   Bring a copy of your living will and/or healthcare power of attorney to your next office visit.  Continue doing brain stimulating activities (puzzles, reading, adult coloring books, staying active) to keep memory sharp.   I have personally reviewed and noted the following in the patient's chart:   . Medical and social history . Use of alcohol, tobacco or illicit drugs  . Current medications and supplements . Functional ability and status . Nutritional status . Physical activity . Advanced directives . List of other physicians . Hospitalizations, surgeries, and ER visits in previous 12 months . Vitals . Screenings to include cognitive, depression, and falls . Referrals and appointments  In addition, I have reviewed and discussed with patient certain preventive protocols, quality metrics, and best practice recommendations. A written personalized care plan for preventive services as well as general preventive health recommendations were provided to patient.     Gerilyn Nestle, RN  03/29/2019  PCP Notes: -Pt wife concerned about memory loss. MMSE=29/30. Phone visit scheduled 9/30 to discuss.  -Patient and wife requesting referral for sleep apnea study. Snores frequently. Phone note sent to PCP.  -Routine f/u with PCP 06/2019.   Medical screening examination/treatment/procedure(s) were performed by non-physician practitioner and as supervising physician I was immediately available for consultation/collaboration.  I agree with above assessment and plan.  Electronically Signed by: Howard Pouch, DO [address] [facility] [address] [facility] [place]

## 2019-03-30 DIAGNOSIS — H401131 Primary open-angle glaucoma, bilateral, mild stage: Secondary | ICD-10-CM | POA: Diagnosis not present

## 2019-03-30 DIAGNOSIS — H25813 Combined forms of age-related cataract, bilateral: Secondary | ICD-10-CM | POA: Diagnosis not present

## 2019-04-14 ENCOUNTER — Ambulatory Visit: Payer: Medicare Other | Admitting: Family Medicine

## 2019-04-15 ENCOUNTER — Ambulatory Visit: Payer: Medicare Other | Admitting: Family Medicine

## 2019-04-22 ENCOUNTER — Other Ambulatory Visit: Payer: Self-pay

## 2019-04-22 ENCOUNTER — Ambulatory Visit (INDEPENDENT_AMBULATORY_CARE_PROVIDER_SITE_OTHER): Payer: Medicare Other | Admitting: Family Medicine

## 2019-04-22 ENCOUNTER — Encounter: Payer: Self-pay | Admitting: Family Medicine

## 2019-04-22 VITALS — BP 128/77 | HR 60 | Temp 98.0°F | Resp 18 | Ht 72.0 in | Wt 213.1 lb

## 2019-04-22 DIAGNOSIS — G3184 Mild cognitive impairment, so stated: Secondary | ICD-10-CM

## 2019-04-22 DIAGNOSIS — Z79899 Other long term (current) drug therapy: Secondary | ICD-10-CM

## 2019-04-22 DIAGNOSIS — Z8546 Personal history of malignant neoplasm of prostate: Secondary | ICD-10-CM | POA: Diagnosis not present

## 2019-04-22 DIAGNOSIS — G40909 Epilepsy, unspecified, not intractable, without status epilepticus: Secondary | ICD-10-CM | POA: Diagnosis not present

## 2019-04-22 NOTE — Progress Notes (Signed)
OFFICE VISIT  04/22/2019   CC:  Chief Complaint  Patient presents with  . Colonoscopy    Pt is wanting referral to get colonoscopy if needed   . Insomnia    Pt has not been sleeping well at night. Pt believes he has sleep apnea   HPI:    Patient is a 78 y.o. Caucasian male who presents accompanied by his wife for concerns about his memory. 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  As a result, he has a long history of seizure disorder for which he has been on dilantin long term.  Fortunately he has been seizure-free since 2007. 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  Wife and daughter have noticed him have gradual, insidious onset short term memory problems. He asks what day it is often.  No probs getting disoriented in home.  He doesn't drive due to some visio-spatial deficits since his closed head injury years ago. Occ some probs with using a computer, wife teaches him to do a simple thing and the next day he forgot how to do it.  More moodiness than normal, little things make him irritable more than usual. No depressed mood or sadness.  Spends time with friends and likes this, has organized gatherings with his friends weekly.   No prob's with taking care of himself/ADLs.  ROS: no CP, no SOB, no wheezing, no cough, no dizziness, no HAs, no rashes, no melena/hematochezia.  No polyuria or polydipsia.  No myalgias or arthralgias. No tremor, no focal weakness, no falls, no seizures.  Past Medical History:  Diagnosis Date  . Anisocoria    R pupil larger than L  . Anxiety   . Baker's cyst of knee 03/2016   Right  . Cataract    Bilat; no surgery  . Closed head injury 2006  . Colon polyps 11/2008 & 2017  . Diverticulosis 11/2008  . DVT of lower limb, acute ([place]) 03/19/2016   (recurrent-->first one was L saph vein in 2015.  2017 Right gastroc (xarelto  started).  Full thrombophilia panel by Dr. Marin Olp 09/2016 was NORMAL.  Dr. Marin Olp recommended low dose xarelto continuation in order to decrease the risk of recurrent DVT (finished 1 yr of full dose anticoag 03/2017).  Xarelto 5mg  qd indefinitely as per 03/2018 and 11/2018 hem f/u.  Marland Kitchen Glaucoma    Primary open angle glaucoma OU, mild stage [name], MD)-stable as of 09/2017 ophth f/u.  Marland Kitchen Hyperlipidemia 11/2015  . 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 Lung nodule 10/2014   calcified granulomata  . Nephrolithiasis   . Osteopenia 2009   By DEXA  . Prostate cancer ([place])    robotic prostectomy   . 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 Ptosis, paralytic, right   . Seizure disorder ([place])    secondary to skull fx.  Last seizure 2007.  Marland Kitchen Skull fracture ([name])    > 30 years ago; now with seizure d/o  . 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  F/u ultrasound was NORMAL.  . Tricompartmental disease of knee 2014   right knee    Past  Surgical History:  Procedure Laterality Date  . CARPAL TUNNEL RELEASE Right   . CARPAL TUNNEL RELEASE    . COLONOSCOPY W/ POLYPECTOMY  12/02/08; 10/12/15   Recall 3 yrs (Dr. Henrene Pastor)  . DEXA  09/02/07   Osteopenia  . INCISION AND DRAINAGE Left    knee  . LITHOTRIPSY    . ROBOT ASSISTED LAPAROSCOPIC RADICAL PROSTATECTOMY  2008  . SUBDURAL HEMATOMA EVACUATION VIA CRANIOTOMY  2006   + temporal intracerebral hematoma--surgical drainage  . 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    Outpatient Medications Prior to Visit  Medication Sig Dispense Refill  . busPIRone (BUSPAR) 5 MG tablet Take 1 tablet (5 mg total) by mouth daily. 90 tablet 1   . Cholecalciferol (VITAMIN D PO) Take by mouth.    . cyclobenzaprine (FLEXERIL) 5 MG tablet Take 1 tablet (5 mg total) by mouth 2 (two) times daily as needed for muscle spasms. 30 tablet 0  . DILANTIN 100 MG ER capsule TAKE TWO CAPSULES BY MOUTH EVERY MORNING AND TAKE THREE CAPSULES AT BEDTIME 450 capsule 1  . latanoprost (XALATAN) 0.005 % ophthalmic solution instill one drop in EACH eye AT BEDTIME  5  . Multiple Vitamins-Minerals (HM MULTIVITAMIN ADULT GUMMY PO) Take by mouth.    . timolol (TIMOPTIC) 0.5 % ophthalmic solution 1 drop 2 (two) times daily.    Alveda Reasons 2.5 MG TABS tablet Take 2 tablets (5 mg total) by mouth daily. 60 tablet 5   No facility-administered medications prior to visit.     Allergies  Allergen Reactions  . Azithromycin Other (See Comments)    seizure  . Penicillins Nausea And Vomiting and Rash  . Shellfish Allergy Swelling    ROS As per HPI  PE: Blood pressure 128/77, pulse 60, temperature 98 F (36.7 C), temperature source Temporal, resp. rate 18, height 6' (1.829 m), weight 213 lb 2 oz (96.7 kg), SpO2 95 %. Body mass index is 28.9 kg/m.  Gen: Alert, well appearing.  Patient is oriented to person, place, time, and situation. AFFECT: pleasant, lucid thought and speech. Forehead symmetric, R upper lid ptosis, R pupil meiosis (nonreactive to light), and inability of R eye to gaze supero-nasally -->all of these abnormalities have been present since his head injury. Otherwise CN's intact.  Mild L hand dysmetria with FNF testing (pt states chronic since head injury). UE strength 5/5 prox/dist bilat.  DTRs in UE's 1+ and symmetric. No cogwheeling.  No tremor.  LE's strength  5/5 prox/dist bilat, L patellar DTR 1-2+ and L achilles DTR 1+, with R patellar DTR trace to 1+, achilles trace.  No clonus. No pronator drift.  Gait: no ataxia or balance problems.  No shuffling.    MMSE today 30/30.  LABS:   No results found for: VITAMINB12  Lab Results  Component  Value Date   PHENYTOIN 18.6 12/18/2017    Lab Results  Component Value Date   TSH 2.12 11/16/2015   Lab Results  Component Value Date   WBC 5.4 11/04/2018   HGB 14.8 11/04/2018   HCT 42.8 11/04/2018   MCV 100.2 (H) 11/04/2018   PLT 190 11/04/2018   Lab Results  Component Value Date   CREATININE 0.98 11/04/2018   BUN 16 11/04/2018   NA 140 11/04/2018   K 4.7 11/04/2018   CL 103 11/04/2018   CO2 28 11/04/2018   Lab Results  Component Value  Date   ALT 9 11/04/2018   AST 15 11/04/2018   ALKPHOS 131 (H) 11/04/2018   BILITOT 0.5 11/04/2018   Lab Results  Component Value Date   CHOL 227 (H) 06/17/2018   Lab Results  Component Value Date   HDL 52.20 06/17/2018   Lab Results  Component Value Date   LDLCALC 150 (H) 06/17/2018   Lab Results  Component Value Date   TRIG 127.0 06/17/2018   Lab Results  Component Value Date   CHOLHDL 4 06/17/2018   Lab Results  Component Value Date   PSA 0.01 (L) 12/18/2017   PSA 0.00 Repeated and verified X2. (L) 12/23/2016   PSA 0.00 Repeated and verified X2. 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 Additionally, he has been on dilantin long term. Finally, we are in the midst of an unprecedented social environment (isolation/fear, etc) ->the covid 19 pandemic and quarantine/shut down. All of this considered, I don't think he has any neurodegenerative process occurring.  No sign of new intracranial pathology. I offered neurologist referral for further evaluation and management and they chose to think about it for now. The only test I'll do regarding this problem is vit b12 level.  2) Seizure d/o:  seizure free for many years now.  He has chosen to stay on dilantin indefinitely. He really likes periodic dilantin trough checks and he is due for another now so we'll have him come back for an early morning lab visit for this.  Also check CBC and CMET for monitoring for being on this high risk medication.  3) History of prostate ca, s/p prostatectomy.  He still requests periodic PSA monitoring so I'll get one with these next labs.  Spent 40 min with pt today, with >50% of this time spent in counseling and care coordination regarding the above problems.  An After Visit Summary was printed and given to the patient.  FOLLOW UP: Return in about 6 months (around 10/21/2019) for routine chronic illness f/u.  Signed:  Crissie Sickles, MD           04/22/2019

## 2019-04-27 ENCOUNTER — Ambulatory Visit (INDEPENDENT_AMBULATORY_CARE_PROVIDER_SITE_OTHER): Payer: Medicare Other | Admitting: Family Medicine

## 2019-04-27 ENCOUNTER — Other Ambulatory Visit: Payer: Self-pay

## 2019-04-27 DIAGNOSIS — Z79899 Other long term (current) drug therapy: Secondary | ICD-10-CM | POA: Diagnosis not present

## 2019-04-27 DIAGNOSIS — G40909 Epilepsy, unspecified, not intractable, without status epilepticus: Secondary | ICD-10-CM

## 2019-04-27 DIAGNOSIS — G3184 Mild cognitive impairment, so stated: Secondary | ICD-10-CM | POA: Diagnosis not present

## 2019-04-27 DIAGNOSIS — Z8546 Personal history of malignant neoplasm of prostate: Secondary | ICD-10-CM

## 2019-04-27 LAB — COMPREHENSIVE METABOLIC PANEL
ALT: 10 U/L (ref 0–53)
AST: 16 U/L (ref 0–37)
Albumin: 4.4 g/dL (ref 3.5–5.2)
Alkaline Phosphatase: 132 U/L — ABNORMAL HIGH (ref 39–117)
BUN: 21 mg/dL (ref 6–23)
CO2: 27 mEq/L (ref 19–32)
Calcium: 9.1 mg/dL (ref 8.4–10.5)
Chloride: 102 mEq/L (ref 96–112)
Creatinine, Ser: 0.91 mg/dL (ref 0.40–1.50)
GFR: 80.76 mL/min (ref >60.00)
Glucose, Bld: 91 mg/dL (ref 70–99)
Potassium: 4.3 mEq/L (ref 3.5–5.1)
Sodium: 138 mEq/L (ref 135–145)
Total Bilirubin: 0.7 mg/dL (ref 0.2–1.2)
Total Protein: 6.7 g/dL (ref 6.0–8.3)

## 2019-04-27 LAB — CBC WITH DIFFERENTIAL/PLATELET
Basophils Absolute: 0.1 10*3/uL (ref 0.0–0.1)
Basophils Relative: 1.5 % (ref 0.0–3.0)
Eosinophils Absolute: 0.2 10*3/uL (ref 0.0–0.7)
Eosinophils Relative: 4.2 % (ref 0.0–5.0)
HCT: 44 % (ref 39.0–52.0)
Hemoglobin: 15.1 g/dL (ref 13.0–17.0)
Lymphocytes Relative: 18 % (ref 12.0–46.0)
Lymphs Abs: 0.9 10*3/uL (ref 0.7–4.0)
MCHC: 34.2 g/dL (ref 30.0–36.0)
MCV: 101.7 fl — ABNORMAL HIGH (ref 78.0–100.0)
Monocytes Absolute: 0.5 10*3/uL (ref 0.1–1.0)
Monocytes Relative: 10.4 % (ref 3.0–12.0)
Neutro Abs: 3.3 10*3/uL (ref 1.4–7.7)
Neutrophils Relative %: 65.9 % (ref 43.0–77.0)
Platelets: 214 10*3/uL (ref 150.0–400.0)
RBC: 4.32 Mil/uL (ref 4.22–5.81)
RDW: 13.7 % (ref 11.5–15.5)
WBC: 5 10*3/uL (ref 4.0–10.5)

## 2019-04-27 LAB — PSA, MEDICARE: PSA: 0 ng/ml — ABNORMAL LOW (ref 0.10–4.00)

## 2019-04-27 LAB — VITAMIN B12: Vitamin B-12: 172 pg/mL — ABNORMAL LOW (ref 211–911)

## 2019-04-28 LAB — PHENYTOIN LEVEL, TOTAL: Phenytoin, Total: 18.7 mg/L (ref 10.0–20.0)

## 2019-04-29 ENCOUNTER — Encounter: Payer: Self-pay | Admitting: Family Medicine

## 2019-04-29 ENCOUNTER — Other Ambulatory Visit: Payer: Self-pay | Admitting: Family Medicine

## 2019-04-29 DIAGNOSIS — E538 Deficiency of other specified B group vitamins: Secondary | ICD-10-CM

## 2019-04-29 HISTORY — DX: Deficiency of other specified B group vitamins: E53.8

## 2019-05-04 ENCOUNTER — Ambulatory Visit (INDEPENDENT_AMBULATORY_CARE_PROVIDER_SITE_OTHER): Payer: Medicare Other

## 2019-05-04 ENCOUNTER — Telehealth: Payer: Self-pay

## 2019-05-04 ENCOUNTER — Other Ambulatory Visit: Payer: Self-pay

## 2019-05-04 DIAGNOSIS — E538 Deficiency of other specified B group vitamins: Secondary | ICD-10-CM | POA: Diagnosis not present

## 2019-05-04 NOTE — Telephone Encounter (Signed)
Received mammogram results for pt, given to PCP for review.

## 2019-05-04 NOTE — Telephone Encounter (Signed)
I think this message was attached to the wrong pt. ??

## 2019-05-05 LAB — VITAMIN B12: Vitamin B-12: 249 pg/mL (ref 211–911)

## 2019-05-05 NOTE — Telephone Encounter (Signed)
Message sent in error

## 2019-05-06 ENCOUNTER — Inpatient Hospital Stay: Payer: Medicare Other

## 2019-05-06 ENCOUNTER — Other Ambulatory Visit: Payer: Self-pay

## 2019-05-06 ENCOUNTER — Inpatient Hospital Stay: Payer: Medicare Other | Attending: Hematology & Oncology | Admitting: Family

## 2019-05-06 VITALS — BP 137/88 | HR 66 | Temp 97.6°F | Resp 16 | Wt 212.4 lb

## 2019-05-06 DIAGNOSIS — I825Z1 Chronic embolism and thrombosis of unspecified deep veins of right distal lower extremity: Secondary | ICD-10-CM

## 2019-05-06 DIAGNOSIS — Z86718 Personal history of other venous thrombosis and embolism: Secondary | ICD-10-CM | POA: Diagnosis not present

## 2019-05-06 DIAGNOSIS — Z7901 Long term (current) use of anticoagulants: Secondary | ICD-10-CM | POA: Diagnosis not present

## 2019-05-06 LAB — CMP (CANCER CENTER ONLY)
ALT: 9 U/L (ref 0–44)
AST: 15 U/L (ref 15–41)
Albumin: 4.1 g/dL (ref 3.5–5.0)
Alkaline Phosphatase: 115 U/L (ref 38–126)
Anion gap: 7 (ref 5–15)
BUN: 16 mg/dL (ref 8–23)
CO2: 28 mmol/L (ref 22–32)
Calcium: 8.9 mg/dL (ref 8.9–10.3)
Chloride: 103 mmol/L (ref 98–111)
Creatinine: 0.91 mg/dL (ref 0.61–1.24)
GFR, Est AFR Am: >60 mL/min (ref >60)
GFR, Estimated: >60 mL/min (ref >60)
Glucose, Bld: 104 mg/dL — ABNORMAL HIGH (ref 70–99)
Potassium: 4.4 mmol/L (ref 3.5–5.1)
Sodium: 138 mmol/L (ref 135–145)
Total Bilirubin: 0.4 mg/dL (ref 0.3–1.2)
Total Protein: 6.2 g/dL — ABNORMAL LOW (ref 6.5–8.1)

## 2019-05-06 LAB — CBC WITH DIFFERENTIAL (CANCER CENTER ONLY)
Abs Immature Granulocytes: 0.05 10*3/uL (ref 0.00–0.07)
Basophils Absolute: 0 10*3/uL (ref 0.0–0.1)
Basophils Relative: 1 %
Eosinophils Absolute: 0.3 10*3/uL (ref 0.0–0.5)
Eosinophils Relative: 4 %
HCT: 40.7 % (ref 39.0–52.0)
Hemoglobin: 13.9 g/dL (ref 13.0–17.0)
Immature Granulocytes: 1 %
Lymphocytes Relative: 18 %
Lymphs Abs: 1 10*3/uL (ref 0.7–4.0)
MCH: 34.6 pg — ABNORMAL HIGH (ref 26.0–34.0)
MCHC: 34.2 g/dL (ref 30.0–36.0)
MCV: 101.2 fL — ABNORMAL HIGH (ref 80.0–100.0)
Monocytes Absolute: 0.6 10*3/uL (ref 0.1–1.0)
Monocytes Relative: 10 %
Neutro Abs: 3.8 10*3/uL (ref 1.7–7.7)
Neutrophils Relative %: 66 %
Platelet Count: 198 10*3/uL (ref 150–400)
RBC: 4.02 MIL/uL — ABNORMAL LOW (ref 4.22–5.81)
RDW: 13 % (ref 11.5–15.5)
WBC Count: 5.7 10*3/uL (ref 4.0–10.5)
nRBC: 0 % (ref 0.0–0.2)

## 2019-05-06 LAB — D-DIMER, QUANTITATIVE: D-Dimer, Quant: <0.27 ug/mL-FEU (ref 0.00–0.50)

## 2019-05-06 NOTE — Progress Notes (Signed)
Hematology and Oncology Follow Up Visit  Adam Melton [id number] August 05, 1940 78 y.o. 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 He continues to do well and low dose maintenance Xarelto and has no new recurrences.  He has had no issues with bleeding but does bruise easily on his arms. No petechiae.  No fever, chills, n/v, cough, rash, dizziness, SOB, chest pain, palpitations, abdominal pain or changes in bowel or bladder habits.  He has numbness and tingling in the left fingertips that is unchanged.  No swelling or tenderness in his extremities.  No falls or syncope.  He has maintained a good appetite and is staying well hydrated. His weight is stable.   ECOG Performance Status: 1 - Symptomatic but completely ambulatory  Medications:  Allergies as of 05/06/2019      Reactions   Azithromycin Other (See Comments)   seizure   Penicillins Nausea And Vomiting, Rash   Shellfish Allergy Swelling      Medication List       Accurate as of May 06, 2019  1:12 PM. If you have any questions, ask your nurse or doctor.        busPIRone 5 MG tablet Commonly known as: BUSPAR Take 1 tablet (5 mg total) by mouth daily.   cyclobenzaprine 5 MG tablet Commonly known as: FLEXERIL Take 1 tablet (5 mg total) by mouth 2 (two) times daily as needed for muscle spasms.   Dilantin 100 MG ER capsule Generic drug: phenytoin TAKE TWO CAPSULES BY MOUTH EVERY MORNING AND TAKE THREE CAPSULES AT BEDTIME   HM MULTIVITAMIN ADULT GUMMY PO Take by mouth.   latanoprost 0.005 % ophthalmic solution Commonly known as: XALATAN instill one drop in [facility] eye AT BEDTIME   timolol 0.5 % ophthalmic solution Commonly known as: TIMOPTIC 1 drop 2 (two) times daily.   VITAMIN D PO Take by mouth.   Xarelto 2.5 MG Tabs  tablet Generic drug: rivaroxaban Take 2 tablets (5 mg total) by mouth daily.       Allergies:  Allergies  Allergen Reactions  . Azithromycin Other (See Comments)    seizure  . Penicillins Nausea And Vomiting and Rash  . Shellfish Allergy Swelling    Past Medical History, Surgical history, Social history, and Family History were reviewed and updated.  Review of Systems: All other 10 point review of systems is negative.   Physical Exam:  vitals were not taken for this visit.   Wt Readings from Last 3 Encounters:  04/22/19 213 lb 2 oz (96.7 kg)  03/29/19 212 lb 6 oz (96.3 kg)  01/01/19 211 lb 6.4 oz (95.9 kg)    Ocular: Sclerae unicteric, pupils equal, round and reactive to light Ear-nose-throat: Oropharynx clear, dentition fair Lymphatic: No cervical or supraclavicular adenopathy Lungs no rales or rhonchi, good excursion bilaterally Heart regular rate and rhythm, no murmur appreciated Abd soft, nontender, positive bowel sounds, no liver or spleen tip palpated on exam, no fluid wave  MSK no focal spinal tenderness, no joint edema Neuro: non-focal, well-oriented, appropriate affect Breasts: Deferred  Lab Results  Component Value Date   WBC 5.0 04/27/2019   HGB 15.1 04/27/2019   HCT 44.0 04/27/2019   MCV 101.7 (H) 04/27/2019   PLT 214.0 04/27/2019   No results found for: FERRITIN, IRON, TIBC, UIBC,  IRONPCTSAT Lab Results  Component Value Date   RBC 4.32 04/27/2019   No results found for: KPAFRELGTCHN, LAMBDASER, KAPLAMBRATIO No results found for: Kandis Cocking, IGMSERUM No results found for: Odetta Pink, SPEI   Chemistry      Component Value Date/Time   NA 138 04/27/2019 0919   NA 144 01/03/2017 1114   K 4.3 04/27/2019 0919   K 4.5 01/03/2017 1114   CL 102 04/27/2019 0919   CL 108 01/03/2017 1114   CO2 27 04/27/2019 0919   CO2 32 01/03/2017 1114   BUN 21 04/27/2019 0919   BUN 13 01/03/2017 1114    CREATININE 0.91 04/27/2019 0919   CREATININE 0.98 11/04/2018 1344   CREATININE 1.1 01/03/2017 1114      Component Value Date/Time   CALCIUM 9.1 04/27/2019 0919   CALCIUM 9.1 01/03/2017 1114   ALKPHOS 132 (H) 04/27/2019 0919   ALKPHOS 125 (H) 01/03/2017 1114   AST 16 04/27/2019 0919   AST 15 11/04/2018 1344   ALT 10 04/27/2019 0919   ALT 9 11/04/2018 1344   ALT 22 01/03/2017 1114   BILITOT 0.7 04/27/2019 0919   BILITOT 0.5 11/04/2018 1344       Impression and Plan: Adam Melton is a very pleasant 78 yo caucasian gentleman with history of recurrent thrombus once anticoagulation was stopped. He is now on lifelong anticoagulation and doing well.  Bilateral lower extremity US in August 2019 showed no evidence of acute or chronic DVT. He did have a minimal amount of nonocclusive wall thickening/SVT with both paired right lesser saphenous veins.  So far there has been no new recurrence.  He will continue his same regimen.  We will plan to see him back in another 6 months.  He will contact our office with any questions or concerns. We can certainly see him sooner if needed.   Laverna Peace, NP 10/22/20201:12 PM

## 2019-05-08 LAB — METHYLMALONIC ACID, SERUM: Methylmalonic Acid, Quant: 265 nmol/L (ref 87–318)

## 2019-05-08 LAB — INTRINSIC FACTOR ANTIBODIES: Intrinsic Factor: NEGATIVE

## 2019-05-09 ENCOUNTER — Encounter: Payer: Self-pay | Admitting: Family Medicine

## 2019-05-11 ENCOUNTER — Telehealth: Payer: Self-pay | Admitting: *Deleted

## 2019-05-11 NOTE — Telephone Encounter (Signed)
Notified pt per Dr. Marin Olp, pt is to continue Xarelto. Pt verbalized understanding. No conerns at this time.

## 2019-05-11 NOTE — Progress Notes (Signed)
AWV reviewed and agree. Signed:  Crissie Sickles, MD           05/11/2019

## 2019-05-12 ENCOUNTER — Encounter: Payer: Self-pay | Admitting: Internal Medicine

## 2019-05-24 DIAGNOSIS — G4733 Obstructive sleep apnea (adult) (pediatric): Secondary | ICD-10-CM | POA: Diagnosis not present

## 2019-05-24 DIAGNOSIS — R0683 Snoring: Secondary | ICD-10-CM | POA: Diagnosis not present

## 2019-05-24 DIAGNOSIS — Z87820 Personal history of traumatic brain injury: Secondary | ICD-10-CM | POA: Diagnosis not present

## 2019-05-24 DIAGNOSIS — G40909 Epilepsy, unspecified, not intractable, without status epilepticus: Secondary | ICD-10-CM | POA: Diagnosis not present

## 2019-06-02 IMAGING — US US EXTREM LOW VENOUS BILAT
1 series · 12 of 24 positions shown · non-contrast
Comparison: Bilateral lower extremity venous Doppler
ultrasound-09/27/2016

CLINICAL DATA: Right lower extremity pain and edema. History of
prior DVT. History of prostate cancer. Patient is currently on
anticoagulation. Evaluate for acute or chronic.



[Series 1: us extrem low venous bilat · 12 of 66 slices shown]
[im 3/66]
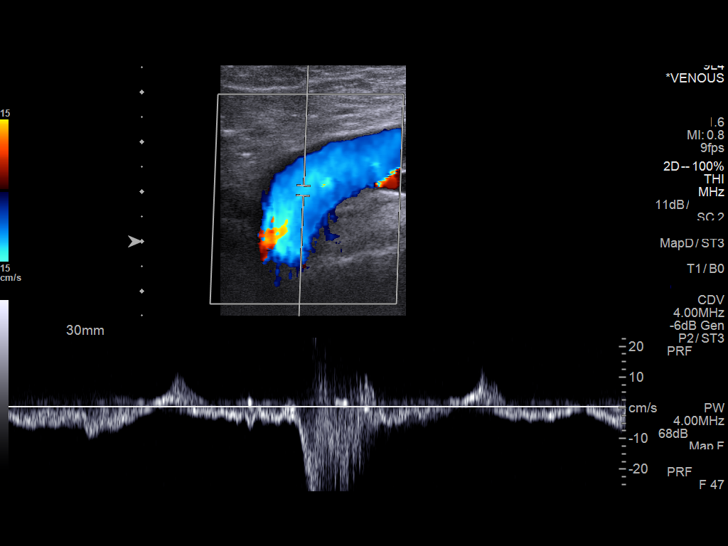
[im 9/66]
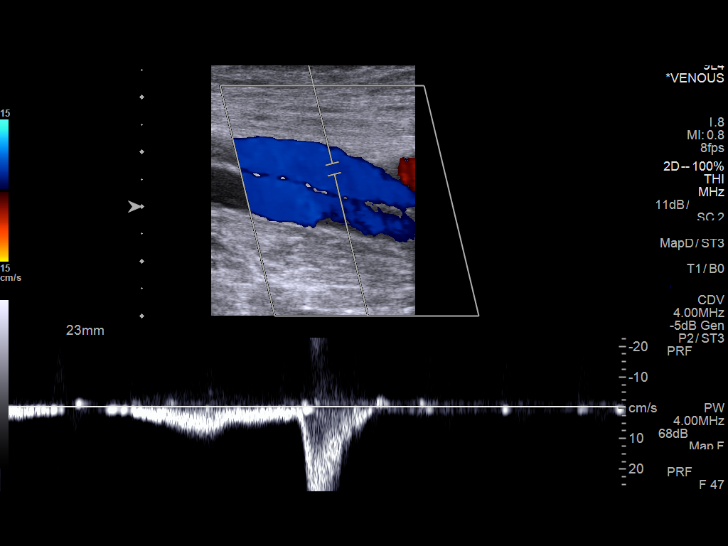
[im 15/66]
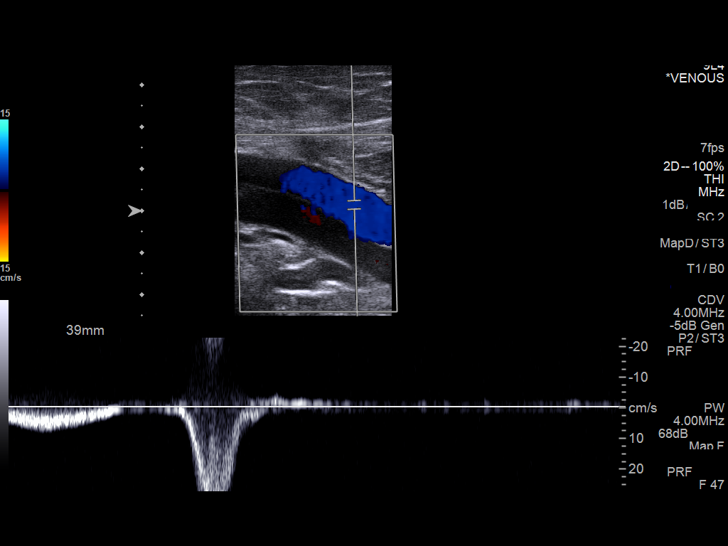
[im 20/66]
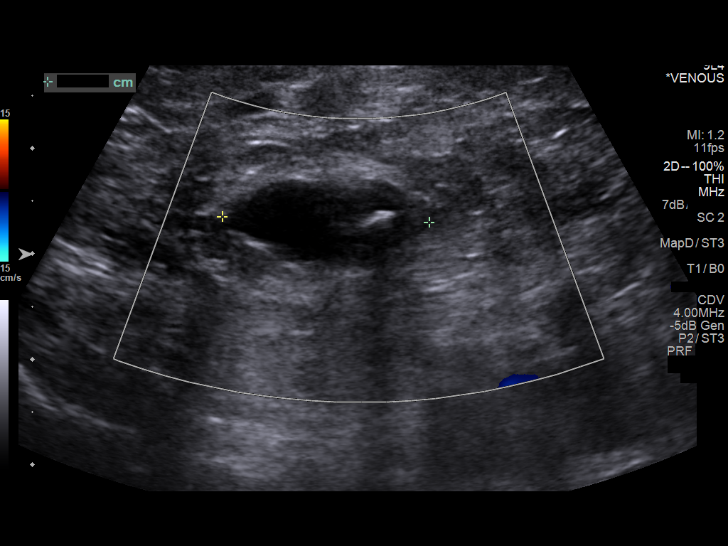
[im 26/66]
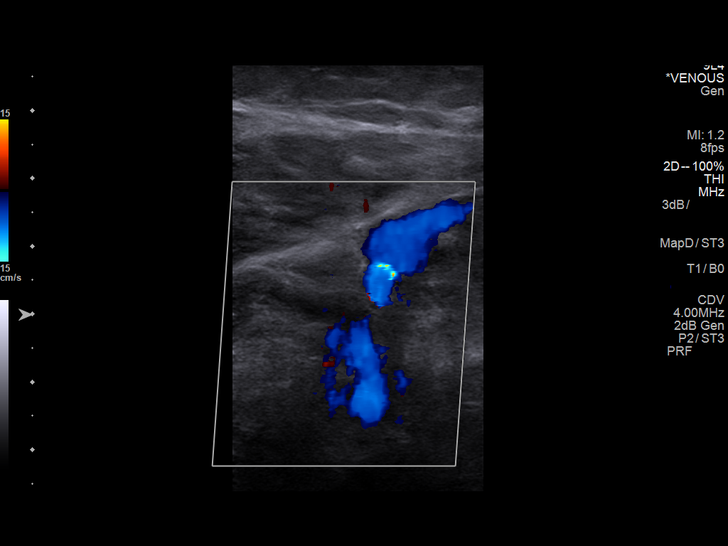
[im 32/66]
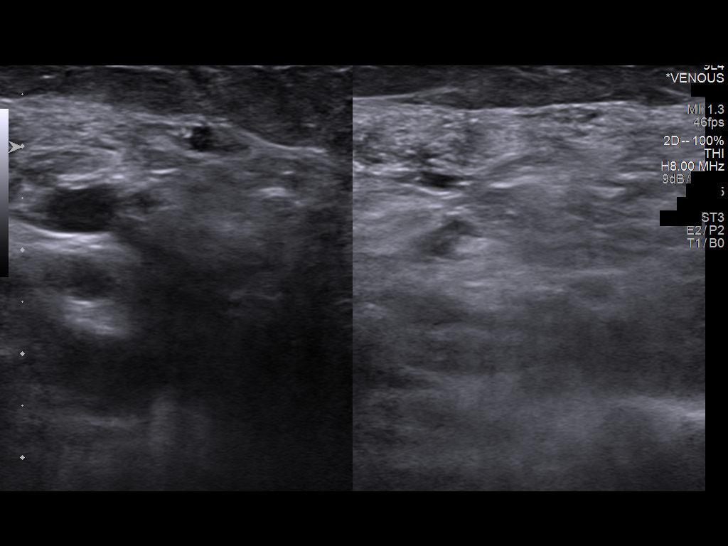
[im 37/66]
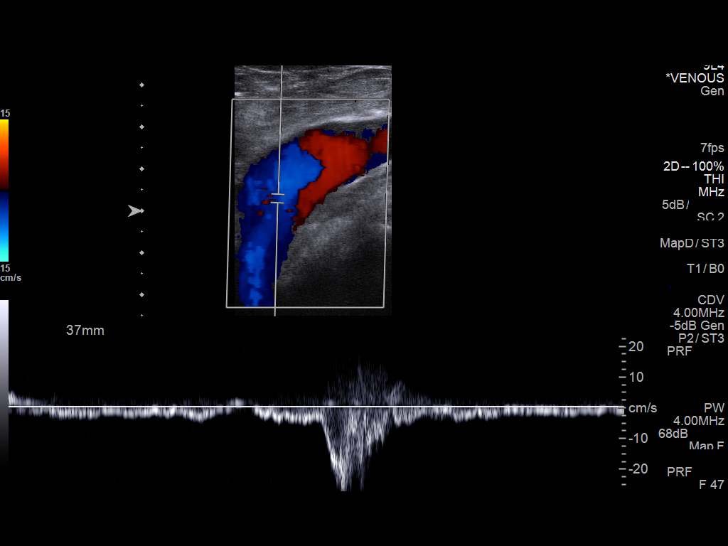
[im 43/66]
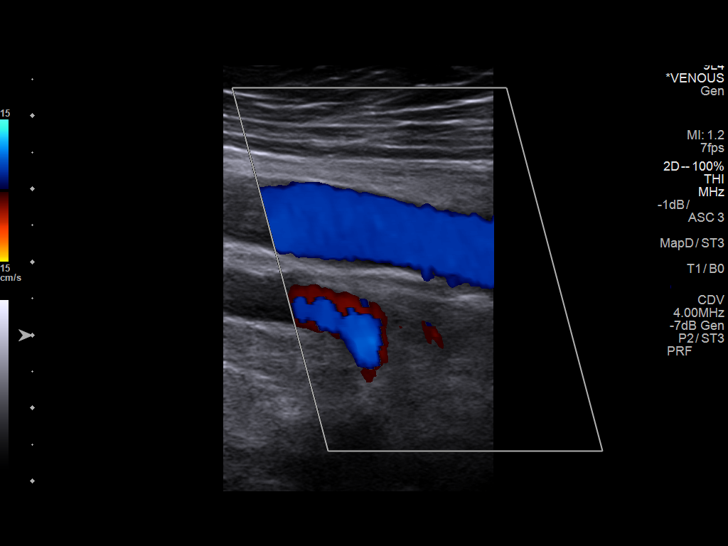
[im 49/66]
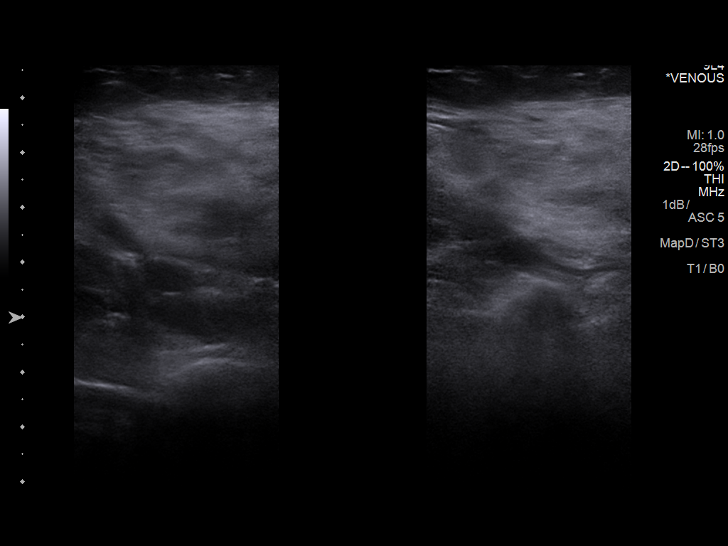
[im 54/66]
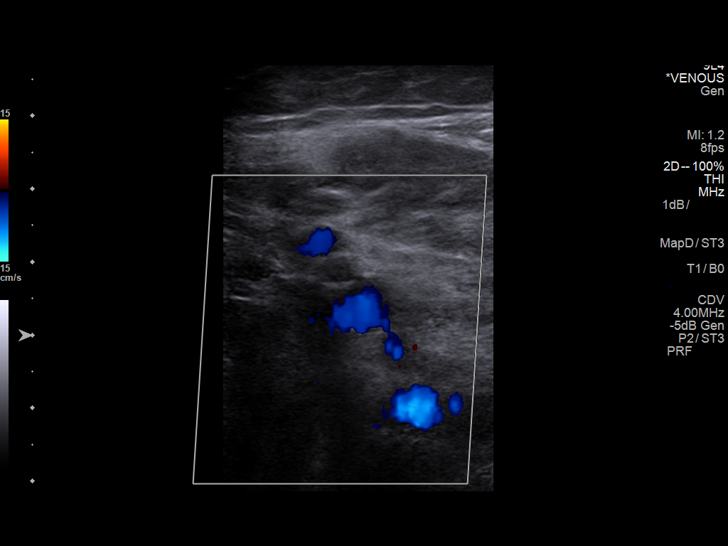
[im 60/66]
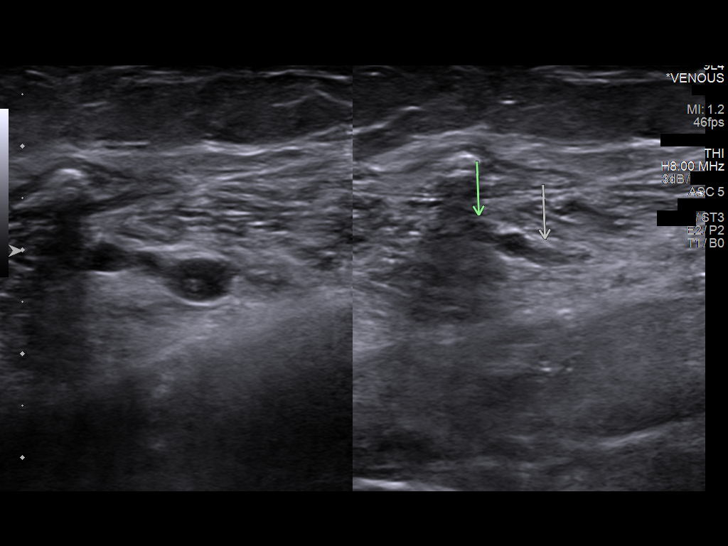
[im 66/66]
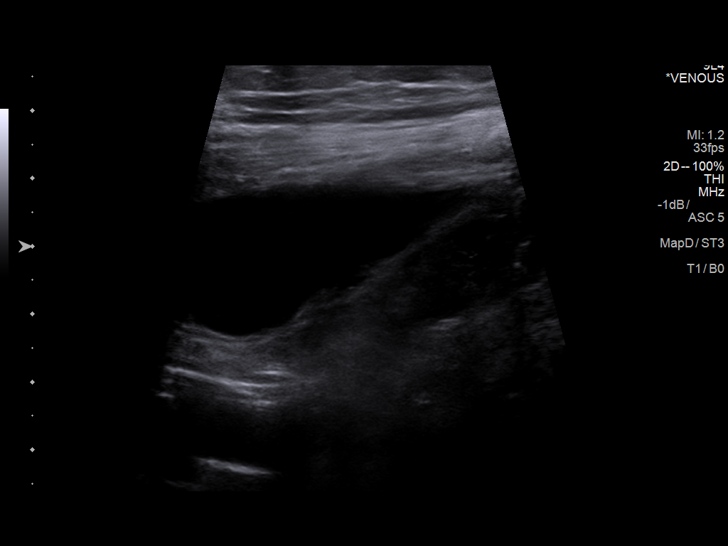

[12 of 24 positions shown; findings below may reference images not displayed]

FINDINGS: RIGHT LOWER EXTREMITY

Common Femoral Vein: No evidence of thrombus. Normal
compressibility, respiratory phasicity and response to augmentation.

Saphenofemoral Junction: No evidence of thrombus. Normal
compressibility and flow on color Doppler imaging.

Profunda Femoral Vein: No evidence of thrombus. Normal
compressibility and flow on color Doppler imaging.

Femoral Vein: No evidence of thrombus. Normal compressibility,
respiratory phasicity and response to augmentation.

Popliteal Vein: No evidence of thrombus. Normal compressibility,
respiratory phasicity and response to augmentation.

Calf Veins: No evidence of thrombus. Normal compressibility and flow
on color Doppler imaging.

Superficial Great Saphenous Vein: No evidence of thrombus. Normal
compressibility.

Venous Reflux:  None.

Other Findings: Note is made of an approximately 6.2 x 6.1 x 2.9 cm
minimally complex fluid collection with the right popliteal fossa,
similar to the [DATE] examination, previously, 8.9 x 4.5 x 1.9 cm
and compatible with a Baker cyst. There is nonocclusive wall
thickening/chronic SVT within both paired right lesser saphenous
veins (image 68).

LEFT LOWER EXTREMITY

Common Femoral Vein: No evidence of thrombus. Normal
compressibility, respiratory phasicity and response to augmentation.

Saphenofemoral Junction: No evidence of thrombus. Normal
compressibility and flow on color Doppler imaging.

Profunda Femoral Vein: No evidence of thrombus. Normal
compressibility and flow on color Doppler imaging.

Femoral Vein: No evidence of thrombus. Normal compressibility,
respiratory phasicity and response to augmentation.

Popliteal Vein: No evidence of thrombus. Normal compressibility,
respiratory phasicity and response to augmentation.

Calf Veins: No evidence of thrombus. Normal compressibility and flow
on color Doppler imaging.

Superficial Great Saphenous Vein: No evidence of thrombus. Normal
compressibility.

Venous Reflux:  None.

Other Findings: Note is made of an approximately 2.8 x 2.0 x 0.9 cm
minimally complex fluid collection with left popliteal fossa similar
to the [DATE] examination, previously, 4.6 x 3.0 x 1.4 cm and
compatible with a Baker cyst. Several patent varicosities are noted
adjacent to the greater saphenous vein at the level the left calf
(images 31 and 32), similar to the [DATE] examination.
IMPRESSION: 1. No evidence of acute or chronic DVT within either lower
extremity.
2. Very minimal amount of nonocclusive wall thickening/chronic SVT
within both paired right lesser saphenous veins.
3. Several prominent though widely patent varicosities are noted
within the left calf, similar to the [DATE] examination.
4. Incidentally noted bilateral Aujla cysts, right greater than
left, similar to the [DATE] examination.

## 2019-06-13 DIAGNOSIS — G4733 Obstructive sleep apnea (adult) (pediatric): Secondary | ICD-10-CM | POA: Diagnosis not present

## 2019-06-16 ENCOUNTER — Ambulatory Visit: Payer: Medicare Other | Admitting: Internal Medicine

## 2019-06-29 ENCOUNTER — Ambulatory Visit: Payer: Medicare Other | Admitting: Family Medicine

## 2019-08-05 ENCOUNTER — Other Ambulatory Visit: Payer: Self-pay | Admitting: Family Medicine

## 2019-08-25 DIAGNOSIS — Z87820 Personal history of traumatic brain injury: Secondary | ICD-10-CM | POA: Diagnosis not present

## 2019-08-25 DIAGNOSIS — G40909 Epilepsy, unspecified, not intractable, without status epilepticus: Secondary | ICD-10-CM | POA: Diagnosis not present

## 2019-08-25 DIAGNOSIS — R0683 Snoring: Secondary | ICD-10-CM | POA: Diagnosis not present

## 2019-08-25 DIAGNOSIS — Z9989 Dependence on other enabling machines and devices: Secondary | ICD-10-CM | POA: Diagnosis not present

## 2019-08-25 DIAGNOSIS — G4733 Obstructive sleep apnea (adult) (pediatric): Secondary | ICD-10-CM | POA: Diagnosis not present

## 2019-09-03 ENCOUNTER — Other Ambulatory Visit: Payer: Self-pay | Admitting: Hematology & Oncology

## 2019-09-22 DIAGNOSIS — Z85828 Personal history of other malignant neoplasm of skin: Secondary | ICD-10-CM | POA: Diagnosis not present

## 2019-09-22 DIAGNOSIS — D485 Neoplasm of uncertain behavior of skin: Secondary | ICD-10-CM | POA: Diagnosis not present

## 2019-09-22 DIAGNOSIS — D235 Other benign neoplasm of skin of trunk: Secondary | ICD-10-CM | POA: Diagnosis not present

## 2019-09-22 DIAGNOSIS — L570 Actinic keratosis: Secondary | ICD-10-CM | POA: Diagnosis not present

## 2019-09-22 DIAGNOSIS — L820 Inflamed seborrheic keratosis: Secondary | ICD-10-CM | POA: Diagnosis not present

## 2019-09-22 DIAGNOSIS — L218 Other seborrheic dermatitis: Secondary | ICD-10-CM | POA: Diagnosis not present

## 2019-09-22 DIAGNOSIS — L821 Other seborrheic keratosis: Secondary | ICD-10-CM | POA: Diagnosis not present

## 2019-09-28 DIAGNOSIS — H43812 Vitreous degeneration, left eye: Secondary | ICD-10-CM | POA: Diagnosis not present

## 2019-09-28 DIAGNOSIS — H5053 Vertical heterophoria: Secondary | ICD-10-CM | POA: Diagnosis not present

## 2019-09-28 DIAGNOSIS — H401131 Primary open-angle glaucoma, bilateral, mild stage: Secondary | ICD-10-CM | POA: Diagnosis not present

## 2019-09-28 DIAGNOSIS — H4901 Third [oculomotor] nerve palsy, right eye: Secondary | ICD-10-CM | POA: Diagnosis not present

## 2019-09-28 DIAGNOSIS — H25813 Combined forms of age-related cataract, bilateral: Secondary | ICD-10-CM | POA: Diagnosis not present

## 2019-09-28 DIAGNOSIS — H527 Unspecified disorder of refraction: Secondary | ICD-10-CM | POA: Diagnosis not present

## 2019-09-28 DIAGNOSIS — H02431 Paralytic ptosis of right eyelid: Secondary | ICD-10-CM | POA: Diagnosis not present

## 2019-09-30 ENCOUNTER — Encounter: Payer: Self-pay | Admitting: Family Medicine

## 2019-10-01 ENCOUNTER — Encounter: Payer: Self-pay | Admitting: Family Medicine

## 2019-10-20 DIAGNOSIS — Z9989 Dependence on other enabling machines and devices: Secondary | ICD-10-CM | POA: Diagnosis not present

## 2019-10-20 DIAGNOSIS — G40909 Epilepsy, unspecified, not intractable, without status epilepticus: Secondary | ICD-10-CM | POA: Diagnosis not present

## 2019-10-20 DIAGNOSIS — Z87820 Personal history of traumatic brain injury: Secondary | ICD-10-CM | POA: Diagnosis not present

## 2019-10-20 DIAGNOSIS — G4733 Obstructive sleep apnea (adult) (pediatric): Secondary | ICD-10-CM | POA: Diagnosis not present

## 2019-10-21 ENCOUNTER — Other Ambulatory Visit: Payer: Self-pay

## 2019-10-21 ENCOUNTER — Telehealth: Payer: Self-pay

## 2019-10-21 ENCOUNTER — Encounter: Payer: Self-pay | Admitting: Family Medicine

## 2019-10-21 ENCOUNTER — Ambulatory Visit (INDEPENDENT_AMBULATORY_CARE_PROVIDER_SITE_OTHER): Payer: Medicare Other | Admitting: Family Medicine

## 2019-10-21 VITALS — BP 119/77 | HR 60 | Temp 98.2°F | Resp 16 | Ht 72.0 in | Wt 211.0 lb

## 2019-10-21 DIAGNOSIS — E538 Deficiency of other specified B group vitamins: Secondary | ICD-10-CM

## 2019-10-21 DIAGNOSIS — G3184 Mild cognitive impairment, so stated: Secondary | ICD-10-CM

## 2019-10-21 DIAGNOSIS — E7800 Pure hypercholesterolemia, unspecified: Secondary | ICD-10-CM | POA: Diagnosis not present

## 2019-10-21 DIAGNOSIS — G40909 Epilepsy, unspecified, not intractable, without status epilepticus: Secondary | ICD-10-CM | POA: Diagnosis not present

## 2019-10-21 DIAGNOSIS — Z8546 Personal history of malignant neoplasm of prostate: Secondary | ICD-10-CM

## 2019-10-21 DIAGNOSIS — Z79899 Other long term (current) drug therapy: Secondary | ICD-10-CM

## 2019-10-21 NOTE — Telephone Encounter (Signed)
OK tell pt this is fine to continue taking.

## 2019-10-21 NOTE — Telephone Encounter (Signed)
Patient is taking Go Out (red cherry & celery seed extract) for his gout control.

## 2019-10-21 NOTE — Telephone Encounter (Signed)
Med list updated, sent as FYI.

## 2019-10-21 NOTE — Progress Notes (Signed)
OFFICE VISIT  10/21/2019   CC:  Chief Complaint  Patient presents with  . Follow-up    RCI, pt is fasting   HPI:    Patient is a 79 y.o. Caucasian male who presents for 6 mo f/u seizure d/o, MCI, hx of prostate ca, also recent dx of vit B12 def.  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 Additionally, he has been on dilantin long term. Finally, we are in the midst of an unprecedented social environment (isolation/fear, etc) ->the covid 19 pandemic and quarantine/shut down. All of this considered, I don't think he has any neurodegenerative process occurring.  No sign of new intracranial pathology. I offered neurologist referral for further evaluation and management and they chose to think about it for now. The only test I'll do regarding this problem is vit b12 level.  2) Seizure d/o: seizure free for many years now.  He has chosen to stay on dilantin indefinitely. He really likes periodic dilantin trough checks and he is due for another now so we'll have him come back for an early morning lab visit for this.  Also check CBC and CMET for monitoring for being on this high risk medication.  3) History of prostate ca, s/p prostatectomy.  He still requests periodic PSA monitoring so I'll get one with these next labs"  Interim hx: His vit B12 level was mildly low last visit, f/u was low normal.  Started him on oral B12 at that time.  Seizure d/o: he TOOK his dilantin this morning, so we'll have to have him back another day for lab/dilantin level. No seizures.    MCI: says nothing has changed.  Still feels like functioning well w/out any assistance with anything. Doing good with mood and anxiety  levels.  Both big toes IP joints were slightly painful, alternating, w/out erythema or swelling.  A "pharmacy friend" thought he may be getting some gout so she gave him a non-rx capsule to take and over the last 3 wks this has helped significantly with his toe pain. However, he has no idea what is in the capsule.  ROS: no fevers, no CP, no SOB, no wheezing, no cough, no dizziness, no HAs, no rashes, no melena/hematochezia.  No polyuria or polydipsia.  No myalgias or arthralgias.  No focal weakness, paresthesias, or tremors.  No acute vision or hearing abnormalities. No n/v/d or abd pain.  No palpitations.     Past Medical History:  Diagnosis Date  . Anxiety   . Baker's cyst of knee 03/2016   Right  . Cataract    Bilat; no surgery  . Closed head injury 2006  . Colon polyps 11/2008 & 2017  . Diverticulosis 11/2008  . DVT of lower limb, acute ([place]) 03/19/2016   (recurrent-->first one was L saph vein in 2015.  2017 Right gastroc (xarelto started).  Full thrombophilia panel by Dr. Marin Olp 09/2016 was NORMAL.  Dr. Marin Olp recommended low dose xarelto continuation in order to decrease the risk of recurrent DVT (finished 1 yr of full dose anticoag 03/2017).  Xarelto 5mg  qd indefinitely as per 03/2018 and 11/2018 hem f/u.  Marland Kitchen Glaucoma    Primary open angle glaucoma OU, mild stage [name], MD)-stable as  of 09/2017 ophth f/u.  Marland Kitchen Hyperlipidemia 11/2015  . 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 Lung nodule 10/2014   calcified granulomata  . Nephrolithiasis   . Osteopenia 2009   By DEXA  . 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 Seizure disorder ([place])    secondary to skull fx.  Last seizure 2007.  Marland Kitchen Skull fracture ([name])    > 30 years ago; now with seizure d/o  . 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  (Ptosis and meiosis)  . 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  F/u ultrasound was NORMAL.  . Tricompartmental disease of knee 2014   right knee  . Vitamin B12 deficiency 04/29/2019   rpt in low normal range (before any replacment b12), IF ab NEG, methylmalonic acid level normal.    Past Surgical History:  Procedure Laterality Date  . CARPAL TUNNEL RELEASE Right   . CARPAL TUNNEL RELEASE    . COLONOSCOPY W/ POLYPECTOMY  12/02/08; 10/12/15   Recall 3 yrs (Dr. Henrene Pastor)  . DEXA  09/02/07   Osteopenia  . INCISION AND DRAINAGE Left    knee  . LITHOTRIPSY    . ROBOT ASSISTED LAPAROSCOPIC RADICAL PROSTATECTOMY  2008  . SUBDURAL HEMATOMA EVACUATION VIA CRANIOTOMY  2006   + temporal intracerebral hematoma--surgical drainage  . 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    Outpatient Medications Prior to Visit  Medication Sig Dispense Refill  . busPIRone (BUSPAR) 5 MG tablet Take 1 tablet (5 mg total) by mouth daily. 90 tablet 0  . Cholecalciferol (VITAMIN D PO) Take by mouth.    Marland Kitchen DILANTIN 100 MG ER capsule TAKE TWO CAPSULES BY MOUTH EVERY MORNING AND TAKE THREE CAPSULES AT BEDTIME 450 capsule 0  . ketoconazole (NIZORAL) 2 % shampoo APPLY TOPICALLY TWO TIMES A WEEK    . latanoprost (XALATAN) 0.005 % ophthalmic solution instill one drop in EACH eye AT BEDTIME  5  . Multiple Vitamins-Minerals (HM MULTIVITAMIN ADULT GUMMY PO) Take by mouth.    . timolol (TIMOPTIC) 0.5 % ophthalmic solution 1 drop 2 (two) times daily.    Alveda Reasons 2.5 MG TABS tablet Take 2 tablets (5 mg total) by mouth daily. 60 tablet 5   No facility-administered medications prior to visit.    Allergies  Allergen Reactions  . Azithromycin Other (See Comments)    seizure  . Penicillins Nausea And Vomiting and Rash  . Shellfish Allergy Swelling    ROS As per HPI  PE: Blood  pressure 119/77, pulse 60, temperature 98.2 F (36.8 C), temperature source Temporal, resp. rate 16, height 6' (1.829 m), weight 211 lb (95.7 kg), SpO2 98 %. Gen: Alert, well appearing.  Patient is oriented to person, place, time, and situation. AFFECT: pleasant, lucid thought and speech. ENT: EOMI except R eye does not elevate at all and he has R ptosis. CV: RRR, no m/r/g.   LUNGS: CTA bilat, nonlabored resps, good aeration in all lung fields. EXT: no clubbing or cyanosis.  Trace bilat LL pitting edema.  No tremors.  Ambulates w/out instability.  LABS:  Lab Results  Component Value Date  TSH 2.12 11/16/2015   Lab Results  Component Value Date   WBC 5.7 05/06/2019   HGB 13.9 05/06/2019   HCT 40.7 05/06/2019   MCV 101.2 (H) 05/06/2019   PLT 198 05/06/2019   Lab Results  Component Value Date   CREATININE 0.91 05/06/2019   BUN 16 05/06/2019   NA 138 05/06/2019   K 4.4 05/06/2019   CL 103 05/06/2019   CO2 28 05/06/2019   Lab Results  Component Value Date   ALT 9 05/06/2019   AST 15 05/06/2019   ALKPHOS 115 05/06/2019   BILITOT 0.4 05/06/2019   Lab Results  Component Value Date   CHOL 227 (H) 06/17/2018   Lab Results  Component Value Date   HDL 52.20 06/17/2018   Lab Results  Component Value Date   LDLCALC 150 (H) 06/17/2018   Lab Results  Component Value Date   TRIG 127.0 06/17/2018   Lab Results  Component Value Date   CHOLHDL 4 06/17/2018   Lab Results  Component Value Date   PSA 0.00 (L) 04/27/2019   PSA 0.01 (L) 12/18/2017   PSA 0.00 Repeated and verified X2. (L) 12/23/2016    IMPRESSION AND PLAN:  1) Seizure d/o: no seizures in years. Pt prefers dilantin indefinitely. Return for dilantin trough and CBC/CMET monitoring.  2) MCI with short term memory impairment: very mild. No change.  Continue obs.  3) Vit B12 def: has been on oral B12 supplement for 6 mo or so. B12 level --future.  4) Hx of prostate ca: last 3 PSA checks with me have  been undetectable. Plan repeat PSA annually->next one in 6 mo.  5) Great toe pain: sounds more like some osteoarthritis type pain to me, not gout. He will ask his friend who gave him the capsule recently to tell him what is actually in it and he'll get back to [place].  6) HLD: not on statin per his preference in the past. He is open to trial of this type of med now. FLP future.  An After Visit Summary was printed and given to the patient.  FOLLOW UP: Return in about 6 months (around 04/21/2020) for routine chronic illness f/u.  Signed:  Crissie Sickles, MD           10/21/2019

## 2019-10-22 ENCOUNTER — Encounter: Payer: Self-pay | Admitting: Family Medicine

## 2019-10-22 ENCOUNTER — Ambulatory Visit (INDEPENDENT_AMBULATORY_CARE_PROVIDER_SITE_OTHER): Payer: Medicare Other | Admitting: Family Medicine

## 2019-10-22 DIAGNOSIS — E7800 Pure hypercholesterolemia, unspecified: Secondary | ICD-10-CM

## 2019-10-22 DIAGNOSIS — Z79899 Other long term (current) drug therapy: Secondary | ICD-10-CM | POA: Diagnosis not present

## 2019-10-22 DIAGNOSIS — E538 Deficiency of other specified B group vitamins: Secondary | ICD-10-CM

## 2019-10-22 DIAGNOSIS — G40909 Epilepsy, unspecified, not intractable, without status epilepticus: Secondary | ICD-10-CM

## 2019-10-22 LAB — CBC WITH DIFFERENTIAL/PLATELET
Basophils Absolute: 0 10*3/uL (ref 0.0–0.1)
Basophils Relative: 0.8 % (ref 0.0–3.0)
Eosinophils Absolute: 0.2 10*3/uL (ref 0.0–0.7)
Eosinophils Relative: 3.5 % (ref 0.0–5.0)
HCT: 41.7 % (ref 39.0–52.0)
Hemoglobin: 14.3 g/dL (ref 13.0–17.0)
Lymphocytes Relative: 16.6 % (ref 12.0–46.0)
Lymphs Abs: 1 10*3/uL (ref 0.7–4.0)
MCHC: 34.3 g/dL (ref 30.0–36.0)
MCV: 101.4 fl — ABNORMAL HIGH (ref 78.0–100.0)
Monocytes Absolute: 0.6 10*3/uL (ref 0.1–1.0)
Monocytes Relative: 9.8 % (ref 3.0–12.0)
Neutro Abs: 4.2 10*3/uL (ref 1.4–7.7)
Neutrophils Relative %: 69.3 % (ref 43.0–77.0)
Platelets: 213 10*3/uL (ref 150.0–400.0)
RBC: 4.11 Mil/uL — ABNORMAL LOW (ref 4.22–5.81)
RDW: 13.4 % (ref 11.5–15.5)
WBC: 6.1 10*3/uL (ref 4.0–10.5)

## 2019-10-22 LAB — COMPREHENSIVE METABOLIC PANEL
ALT: 10 U/L (ref 0–53)
AST: 15 U/L (ref 0–37)
Albumin: 4.2 g/dL (ref 3.5–5.2)
Alkaline Phosphatase: 121 U/L — ABNORMAL HIGH (ref 39–117)
BUN: 18 mg/dL (ref 6–23)
CO2: 27 mEq/L (ref 19–32)
Calcium: 8.8 mg/dL (ref 8.4–10.5)
Chloride: 104 mEq/L (ref 96–112)
Creatinine, Ser: 0.9 mg/dL (ref 0.40–1.50)
GFR: 81.69 mL/min (ref >60.00)
Glucose, Bld: 93 mg/dL (ref 70–99)
Potassium: 4.5 mEq/L (ref 3.5–5.1)
Sodium: 140 mEq/L (ref 135–145)
Total Bilirubin: 0.7 mg/dL (ref 0.2–1.2)
Total Protein: 6.1 g/dL (ref 6.0–8.3)

## 2019-10-22 LAB — LIPID PANEL
Cholesterol: 210 mg/dL — ABNORMAL HIGH (ref 0–200)
HDL: 43.1 mg/dL (ref >39.00)
LDL Cholesterol: 142 mg/dL — ABNORMAL HIGH (ref 0–99)
NonHDL: 166.64
Total CHOL/HDL Ratio: 5
Triglycerides: 123 mg/dL (ref 0.0–149.0)
VLDL: 24.6 mg/dL (ref 0.0–40.0)

## 2019-10-22 LAB — VITAMIN B12: Vitamin B-12: 677 pg/mL (ref 211–911)

## 2019-10-22 NOTE — Telephone Encounter (Signed)
Patient advised and voiced understanding.  

## 2019-10-23 LAB — PHENYTOIN LEVEL, TOTAL: Phenytoin, Total: 18.8 mg/L (ref 10.0–20.0)

## 2019-10-25 ENCOUNTER — Ambulatory Visit: Payer: Medicare Other

## 2019-11-01 ENCOUNTER — Inpatient Hospital Stay (HOSPITAL_BASED_OUTPATIENT_CLINIC_OR_DEPARTMENT_OTHER): Payer: Medicare Other | Admitting: Hematology & Oncology

## 2019-11-01 ENCOUNTER — Other Ambulatory Visit: Payer: Self-pay

## 2019-11-01 ENCOUNTER — Inpatient Hospital Stay: Payer: Medicare Other | Attending: Hematology & Oncology

## 2019-11-01 ENCOUNTER — Encounter: Payer: Self-pay | Admitting: Hematology & Oncology

## 2019-11-01 VITALS — BP 137/89 | HR 56 | Temp 96.9°F | Resp 16 | Wt 215.0 lb

## 2019-11-01 DIAGNOSIS — I825Z1 Chronic embolism and thrombosis of unspecified deep veins of right distal lower extremity: Secondary | ICD-10-CM

## 2019-11-01 DIAGNOSIS — Z7901 Long term (current) use of anticoagulants: Secondary | ICD-10-CM | POA: Insufficient documentation

## 2019-11-01 DIAGNOSIS — Z79899 Other long term (current) drug therapy: Secondary | ICD-10-CM | POA: Insufficient documentation

## 2019-11-01 DIAGNOSIS — I82461 Acute embolism and thrombosis of right calf muscular vein: Secondary | ICD-10-CM | POA: Diagnosis not present

## 2019-11-01 LAB — CBC WITH DIFFERENTIAL (CANCER CENTER ONLY)
Abs Immature Granulocytes: 0.04 10*3/uL (ref 0.00–0.07)
Basophils Absolute: 0 10*3/uL (ref 0.0–0.1)
Basophils Relative: 1 %
Eosinophils Absolute: 0.3 10*3/uL (ref 0.0–0.5)
Eosinophils Relative: 5 %
HCT: 41.6 % (ref 39.0–52.0)
Hemoglobin: 14.1 g/dL (ref 13.0–17.0)
Immature Granulocytes: 1 %
Lymphocytes Relative: 21 %
Lymphs Abs: 1.1 10*3/uL (ref 0.7–4.0)
MCH: 34.1 pg — ABNORMAL HIGH (ref 26.0–34.0)
MCHC: 33.9 g/dL (ref 30.0–36.0)
MCV: 100.7 fL — ABNORMAL HIGH (ref 80.0–100.0)
Monocytes Absolute: 0.5 10*3/uL (ref 0.1–1.0)
Monocytes Relative: 10 %
Neutro Abs: 3.2 10*3/uL (ref 1.7–7.7)
Neutrophils Relative %: 62 %
Platelet Count: 213 10*3/uL (ref 150–400)
RBC: 4.13 MIL/uL — ABNORMAL LOW (ref 4.22–5.81)
RDW: 13.1 % (ref 11.5–15.5)
WBC Count: 5.1 10*3/uL (ref 4.0–10.5)
nRBC: 0 % (ref 0.0–0.2)

## 2019-11-01 LAB — CMP (CANCER CENTER ONLY)
ALT: 11 U/L (ref 0–44)
AST: 19 U/L (ref 15–41)
Albumin: 4.1 g/dL (ref 3.5–5.0)
Alkaline Phosphatase: 119 U/L (ref 38–126)
Anion gap: 8 (ref 5–15)
BUN: 18 mg/dL (ref 8–23)
CO2: 28 mmol/L (ref 22–32)
Calcium: 9 mg/dL (ref 8.9–10.3)
Chloride: 105 mmol/L (ref 98–111)
Creatinine: 1.17 mg/dL (ref 0.61–1.24)
GFR, Est AFR Am: >60 mL/min (ref >60)
GFR, Estimated: 60 mL/min — ABNORMAL LOW (ref >60)
Glucose, Bld: 84 mg/dL (ref 70–99)
Potassium: 4.4 mmol/L (ref 3.5–5.1)
Sodium: 141 mmol/L (ref 135–145)
Total Bilirubin: 0.4 mg/dL (ref 0.3–1.2)
Total Protein: 6.5 g/dL (ref 6.5–8.1)

## 2019-11-01 LAB — D-DIMER, QUANTITATIVE: D-Dimer, Quant: 0.49 ug/mL-FEU (ref 0.00–0.50)

## 2019-11-01 NOTE — Progress Notes (Signed)
Hematology and Oncology Follow Up Visit  Adam Melton [id number] 04/10/1941 79 y.o. 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 is back for follow-up.  He is doing pretty well.  He comes in with his wife.  They just had their 55th wedding anniversary.  This is very impressive.  He is doing okay.  They have not been able to travel like they had wanted.  At plan to go to [place] last year.  Hopefully, they will be able to go next year when everything eases up a little bit with the coronavirus.  He and his wife have had the coronavirus vaccines.  He has had no problems with bleeding.  He does have some bruising.  He is on the low-dose Xarelto.  He will stay on this.  There is been no change in bowel or bladder habits.  He has had no cough.  He has had no fever.  He has had no chest wall pain.    Overall, his performance status is ECOG 0.  Medications:  Current Outpatient Medications:  .  busPIRone (BUSPAR) 5 MG tablet, Take 1 tablet (5 mg total) by mouth daily., Disp: 90 tablet, Rfl: 0 .  Celery Seed OIL, Take by mouth daily., Disp: , Rfl:  .  CHERRY PO, Take by mouth daily., Disp: , Rfl:  .  Cholecalciferol (VITAMIN D PO), Take by mouth., Disp: , Rfl:  .  DILANTIN 100 MG ER capsule, TAKE TWO CAPSULES BY MOUTH EVERY MORNING AND TAKE THREE CAPSULES AT BEDTIME, Disp: 450 capsule, Rfl: 0 .  ketoconazole (NIZORAL) 2 % shampoo, APPLY TOPICALLY TWO TIMES A WEEK, Disp: , Rfl:  .  latanoprost (XALATAN) 0.005 % ophthalmic solution, instill one drop in EACH eye AT BEDTIME, Disp: , Rfl: 5 .  Multiple Vitamins-Minerals (HM MULTIVITAMIN ADULT GUMMY PO), Take by mouth., Disp: , Rfl:  .  timolol (TIMOPTIC) 0.5 % ophthalmic solution, 1 drop 2 (two) times daily., Disp: , Rfl:  .  XARELTO 2.5 MG TABS tablet, Take 2 tablets (5 mg total) by mouth daily.,  Disp: 60 tablet, Rfl: 5  Allergies:  Allergies  Allergen Reactions  . Azithromycin Other (See Comments)    seizure  . Penicillins Nausea And Vomiting and Rash  . Shellfish Allergy Swelling    Past Medical History, Surgical history, Social history, and Family History were reviewed and updated.  Review of Systems: Review of Systems  Constitutional: Negative.   HENT: Negative.   Eyes: Negative.   Respiratory: Negative.   Cardiovascular: Negative.   Gastrointestinal: Negative.   Genitourinary: Negative.   Musculoskeletal: Negative.   Skin: Negative.   Neurological: Negative.   Endo/Heme/Allergies: Negative.   Psychiatric/Behavioral: Negative.      Physical Exam:  weight is 215 lb (97.5 kg). His temporal temperature is 96.9 F (36.1 C) (abnormal). His blood pressure is 137/89 and his pulse is 56 (abnormal). His respiration is 16 and oxygen saturation is 95%.   Wt Readings from Last 3 Encounters:  11/01/19 215 lb (97.5 kg)  10/21/19 211 lb (95.7 kg)  05/06/19 212 lb 6.4 oz (96.3 kg)     Physical Exam Vitals reviewed.  HENT:     Head: Normocephalic and atraumatic.  Eyes:     Pupils: Pupils are equal, round, and reactive to light.  Cardiovascular:     Rate and  Rhythm: Normal rate and regular rhythm.     Heart sounds: Normal heart sounds.  Pulmonary:     Effort: Pulmonary effort is normal.     Breath sounds: Normal breath sounds.  Abdominal:     General: Bowel sounds are normal.     Palpations: Abdomen is soft.  Musculoskeletal:        General: No tenderness or deformity. Normal range of motion.     Cervical back: Normal range of motion.  Lymphadenopathy:     Cervical: No cervical adenopathy.  Skin:    General: Skin is warm and dry.     Findings: No erythema or rash.  Neurological:     Mental Status: He is alert and oriented to person, place, and time.  Psychiatric:        Behavior: Behavior normal.        Thought Content: Thought content normal.         Judgment: Judgment normal.   Is Lab Results  Component Value Date   WBC 5.1 11/01/2019   HGB 14.1 11/01/2019   HCT 41.6 11/01/2019   MCV 100.7 (H) 11/01/2019   PLT 213 11/01/2019     Chemistry      Component Value Date/Time   NA 141 11/01/2019 1253   NA 144 01/03/2017 1114   K 4.4 11/01/2019 1253   K 4.5 01/03/2017 1114   CL 105 11/01/2019 1253   CL 108 01/03/2017 1114   CO2 28 11/01/2019 1253   CO2 32 01/03/2017 1114   BUN 18 11/01/2019 1253   BUN 13 01/03/2017 1114   CREATININE 1.17 11/01/2019 1253   CREATININE 1.1 01/03/2017 1114      Component Value Date/Time   CALCIUM 9.0 11/01/2019 1253   CALCIUM 9.1 01/03/2017 1114   ALKPHOS 119 11/01/2019 1253   ALKPHOS 125 (H) 01/03/2017 1114   AST 19 11/01/2019 1253   ALT 11 11/01/2019 1253   ALT 22 01/03/2017 1114   BILITOT 0.4 11/01/2019 1253         Impression and Plan: [name] is a 79 year old white male. He developed a thrombus in the right lower leg. He has had prior thromboembolic disease. He was taken off blood thinner with Coumadin and then developed another thrombus.  Again, he is on lifelong anticoagulation from my point of view.  I think 5 mg is appropriate for him.  I just do not think that this should not cause any problems and that it should be effective.  I will plan to see him back in 8 more months.    [name], MD 4/19/20211:40 PM

## 2019-11-03 ENCOUNTER — Other Ambulatory Visit: Payer: Medicare Other

## 2019-11-03 ENCOUNTER — Ambulatory Visit: Payer: Medicare Other | Admitting: Hematology & Oncology

## 2019-11-11 ENCOUNTER — Other Ambulatory Visit: Payer: Self-pay | Admitting: Family Medicine

## 2020-02-05 ENCOUNTER — Other Ambulatory Visit: Payer: Self-pay | Admitting: Family Medicine

## 2020-02-07 ENCOUNTER — Telehealth: Payer: Self-pay

## 2020-02-07 ENCOUNTER — Other Ambulatory Visit: Payer: Self-pay

## 2020-02-07 NOTE — Telephone Encounter (Signed)
RF sent for 90 day supply. Left message advising patient this had been complete

## 2020-02-07 NOTE — Telephone Encounter (Signed)
Patient requests Rx DILANTIN 100 MG ER capsule to be sent to [facility].

## 2020-03-06 ENCOUNTER — Other Ambulatory Visit: Payer: Self-pay | Admitting: Hematology & Oncology

## 2020-03-29 DIAGNOSIS — W908XXS Exposure to other nonionizing radiation, sequela: Secondary | ICD-10-CM | POA: Diagnosis not present

## 2020-03-29 DIAGNOSIS — L570 Actinic keratosis: Secondary | ICD-10-CM | POA: Diagnosis not present

## 2020-03-29 DIAGNOSIS — D235 Other benign neoplasm of skin of trunk: Secondary | ICD-10-CM | POA: Diagnosis not present

## 2020-03-29 DIAGNOSIS — L578 Other skin changes due to chronic exposure to nonionizing radiation: Secondary | ICD-10-CM | POA: Diagnosis not present

## 2020-03-29 DIAGNOSIS — L821 Other seborrheic keratosis: Secondary | ICD-10-CM | POA: Diagnosis not present

## 2020-03-29 DIAGNOSIS — Z85828 Personal history of other malignant neoplasm of skin: Secondary | ICD-10-CM | POA: Diagnosis not present

## 2020-03-29 DIAGNOSIS — D485 Neoplasm of uncertain behavior of skin: Secondary | ICD-10-CM | POA: Diagnosis not present

## 2020-03-29 DIAGNOSIS — L218 Other seborrheic dermatitis: Secondary | ICD-10-CM | POA: Diagnosis not present

## 2020-03-29 DIAGNOSIS — C4441 Basal cell carcinoma of skin of scalp and neck: Secondary | ICD-10-CM | POA: Diagnosis not present

## 2020-04-20 DIAGNOSIS — C4441 Basal cell carcinoma of skin of scalp and neck: Secondary | ICD-10-CM | POA: Diagnosis not present

## 2020-04-24 ENCOUNTER — Encounter: Payer: Self-pay | Admitting: Family Medicine

## 2020-04-24 ENCOUNTER — Telehealth: Payer: Self-pay

## 2020-04-24 ENCOUNTER — Ambulatory Visit (INDEPENDENT_AMBULATORY_CARE_PROVIDER_SITE_OTHER): Payer: Medicare Other | Admitting: Family Medicine

## 2020-04-24 ENCOUNTER — Other Ambulatory Visit: Payer: Self-pay

## 2020-04-24 VITALS — BP 152/97 | HR 58 | Temp 97.6°F | Resp 16 | Ht 72.0 in | Wt 213.6 lb

## 2020-04-24 DIAGNOSIS — G40909 Epilepsy, unspecified, not intractable, without status epilepticus: Secondary | ICD-10-CM

## 2020-04-24 DIAGNOSIS — Z8546 Personal history of malignant neoplasm of prostate: Secondary | ICD-10-CM | POA: Diagnosis not present

## 2020-04-24 DIAGNOSIS — E538 Deficiency of other specified B group vitamins: Secondary | ICD-10-CM | POA: Diagnosis not present

## 2020-04-24 DIAGNOSIS — Z79899 Other long term (current) drug therapy: Secondary | ICD-10-CM | POA: Diagnosis not present

## 2020-04-24 DIAGNOSIS — E7800 Pure hypercholesterolemia, unspecified: Secondary | ICD-10-CM | POA: Diagnosis not present

## 2020-04-24 DIAGNOSIS — Z125 Encounter for screening for malignant neoplasm of prostate: Secondary | ICD-10-CM

## 2020-04-24 NOTE — Progress Notes (Signed)
OFFICE VISIT  04/24/2020  CC:  Chief Complaint  Patient presents with  . Follow-up    RCI, pt is not fasting    HPI:    Patient is a 79 y.o. Caucasian male who presents for 6 mo f/u seizure, vit B12 def, HLD. Has hx of prostate ca for which his PSAs are followed annually.  Hx of LLL and RLL DVT, is on low dose xarelto indefinitely per hematologist.  A/P as of last visit: "1) Seizure d/o: no seizures in years. Pt prefers dilantin indefinitely. Return for dilantin trough and CBC/CMET monitoring.  2) MCI with short term memory impairment: very mild. No change.  Continue obs.  3) Vit B12 def: has been on oral B12 supplement for 6 mo or so. B12 level --future.  4) Hx of prostate ca: last 3 PSA checks with me have been undetectable. Plan repeat PSA annually->next one in 6 mo.  5) Great toe pain: sounds more like some osteoarthritis type pain to me, not gout. He will ask his friend who gave him the capsule recently to tell him what is actually in it and he'll get back to [place].  6) HLD: not on statin per his preference in the past. He is open to trial of this type of med now. FLP future."  INTERIM HX: Doing well.  No seizures.  Taking dilantin as rx'd.  Notes some chronic mild L LL swelling/fullness, more varicose veins gradually over the last few years.  He takes 1000 mcg b12 tab daily. Past Medical History:  Diagnosis Date  . Anxiety   . Baker's cyst of knee 03/2016   Right  . Cataract    Bilat; no surgery  . Closed head injury 2006  . Colon polyps 11/2008 & 2017  . Diverticulosis 11/2008  . DVT of lower limb, acute ([name]) 03/19/2016   (recurrent-->first one was L saph vein in 2015.  2017 Right gastroc (xarelto started).  Full thrombophilia panel by Dr. Marin Olp 09/2016 was NORMAL.  Dr. Marin Olp recommended low dose xarelto continuation in order to decrease the risk of recurrent DVT (finished 1 yr of full dose anticoag 03/2017).  Xarelto 5mg  qd indefinitely as per 03/2018  and 11/2018 hem f/u.  Marland Kitchen Glaucoma    Primary open angle glaucoma OU, mild stage [name], MD)-stable as of 09/2017 ophth f/u.  Marland Kitchen Hyperlipidemia 11/2015   Crestor tried briefly but he self d/c'd the med, basically favors TLC   . 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 Lung nodule 10/2014   calcified granulomata  . Nephrolithiasis   . Osteopenia 2009   By DEXA  . 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  Last seizure 2007.  Marland Kitchen Skull fracture ([name])    > 30 years ago; now with seizure d/o  . 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  (Ptosis and meiosis)  . 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  F/u ultrasound was NORMAL.  . Tricompartmental disease of knee 2014   right knee  . Vitamin B12 deficiency 04/29/2019   rpt in low normal range (before any replacment b12), IF ab NEG, methylmalonic acid level normal.  Past Surgical History:  Procedure Laterality Date  . CARPAL TUNNEL RELEASE Right   . CARPAL TUNNEL RELEASE    . COLONOSCOPY W/ POLYPECTOMY  12/02/08; 10/12/15   Recall 3 yrs (Dr. Henrene Pastor)  . DEXA  09/02/07   Osteopenia  . INCISION AND DRAINAGE Left    knee  . LITHOTRIPSY    . ROBOT ASSISTED LAPAROSCOPIC RADICAL PROSTATECTOMY  2008  . SUBDURAL HEMATOMA EVACUATION VIA CRANIOTOMY  2006   + temporal intracerebral hematoma--surgical drainage  . 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    Outpatient Medications Prior to Visit  Medication Sig Dispense Refill  . Ascorbic Acid (VITAMIN C PO) Take by mouth daily.    . busPIRone (BUSPAR) 5 MG tablet Take 1 tablet (5 mg total) by mouth daily. 90 tablet 1   . Cholecalciferol (VITAMIN D PO) Take by mouth.    . Cyanocobalamin (B-12 PO) Take by mouth daily.    Marland Kitchen DILANTIN 100 MG ER capsule TAKE TWO CAPSULES BY MOUTH EVERY MORNING AND TAKE THREE CAPSULES AT BEDTIME 450 capsule 0  . ketoconazole (NIZORAL) 2 % shampoo APPLY TOPICALLY TWO TIMES A WEEK    . latanoprost (XALATAN) 0.005 % ophthalmic solution instill one drop in EACH eye AT BEDTIME  5  . timolol (TIMOPTIC) 0.5 % ophthalmic solution 1 drop 2 (two) times daily.    Alveda Reasons 2.5 MG TABS tablet Take 2 tablets (5 mg total) by mouth daily. 60 tablet 5  . Celery Seed OIL Take by mouth daily. (Patient not taking: Reported on 04/24/2020)    . CHERRY PO Take by mouth daily. (Patient not taking: Reported on 04/24/2020)    . imiquimod (ALDARA) 5 % cream Apply 1 application topically 2 (two) times a week. (Patient not taking: Reported on 04/24/2020)    . Multiple Vitamins-Minerals (HM MULTIVITAMIN ADULT GUMMY PO) Take by mouth. (Patient not taking: Reported on 04/24/2020)     No facility-administered medications prior to visit.    Allergies  Allergen Reactions  . Azithromycin Other (See Comments)    seizure  . Penicillins Nausea And Vomiting and Rash  . Shellfish Allergy Swelling    ROS As per HPI  PE: Vitals with BMI 04/24/2020 11/01/2019 10/21/2019  Height 6\' 0"  - 6\' 0"   Weight 213 lbs 10 oz 215 lbs 211 lbs  BMI 28.96 96.29 52.84  Systolic 132 440 102  Diastolic 97 89 77  Pulse 58 56 60   Manual bp repeat today: 120s/70s  Gen: Alert, well appearing.  Patient is oriented to person, place, time, and situation. AFFECT: pleasant, lucid thought and speech. CV: RRR, no m/r/g.   LUNGS: CTA bilat, nonlabored resps, good aeration in all lung fields. EXT: no clubbing or cyanosis.  1+ bilat LL pitting edema. Non-inflamed varicose veins bilat, L>R.  No signif leg circ asymmetry.    LABS:  Lab Results  Component Value Date   TSH 2.12 11/16/2015   Lab Results  Component Value Date   WBC  5.1 11/01/2019   HGB 14.1 11/01/2019   HCT 41.6 11/01/2019   MCV 100.7 (H) 11/01/2019   PLT 213 11/01/2019   Lab Results  Component Value Date   CREATININE 1.17 11/01/2019   BUN 18 11/01/2019   NA 141 11/01/2019   K 4.4 11/01/2019   CL 105 11/01/2019   CO2 28 11/01/2019   Lab Results  Component Value Date  ALT 11 11/01/2019   AST 19 11/01/2019   ALKPHOS 119 11/01/2019   BILITOT 0.4 11/01/2019   Lab Results  Component Value Date   CHOL 210 (H) 10/22/2019   Lab Results  Component Value Date   HDL 43.10 10/22/2019   Lab Results  Component Value Date   LDLCALC 142 (H) 10/22/2019   Lab Results  Component Value Date   TRIG 123.0 10/22/2019   Lab Results  Component Value Date   CHOLHDL 5 10/22/2019   Lab Results  Component Value Date   PSA 0.00 (L) 04/27/2019   PSA 0.01 (L) 12/18/2017   PSA 0.00 Repeated and verified X2. (L) 12/23/2016   Lab Results  Component Value Date   VITAMINB12 677 10/22/2019    IMPRESSION AND PLAN:  1) HLD: intol of statin x 1 attempt. He has preferred TLC approach since that time. He is not fasting today. FLP --future.   2) Hx of prostate ca: we do annual PSA checks.   PSA ordered--future.  3) B12 def: taking 1000 mcg vit B12 tab daily. This got his level into normal range 10/2019. B12 level--future.  If normal range again then we'll just check b12 annually and keep him on 1065mcg qd.  4) Hx of R and L LL DVTs: xarelto 5mg  qd indefinitely per hematologist. He'll get labs by Dr. Marin Olp soon (CMP, CBC).  5) Seizure d/o: no seizure in many years. Pt prefers to stay on dilantin indefinitely. Return for dilantin trough at earliest convenience (he took his dilantin this morning).  An After Visit Summary was printed and given to the patient.  FOLLOW UP: Return for 6 mo f/u [facility]; needs fasting lab appt at his earliest convenience (orders are in).  Signed:  Crissie Sickles, MD           04/24/2020

## 2020-04-24 NOTE — Telephone Encounter (Signed)
Patient has questions regarding after visit summary from today.  Patient wife called (DPR).  Please call [phone]

## 2020-04-24 NOTE — Telephone Encounter (Signed)
Pt was not sure if he needed to send anything to anywhere. I clarified with pt that all he needs to do is come for his lab visit and everything else will be taken care of by [place]. He will schedule 6 mnth f/u at lab visit if needed.

## 2020-04-25 ENCOUNTER — Other Ambulatory Visit: Payer: Self-pay | Admitting: Family Medicine

## 2020-04-25 ENCOUNTER — Ambulatory Visit (INDEPENDENT_AMBULATORY_CARE_PROVIDER_SITE_OTHER): Payer: Medicare Other

## 2020-04-25 DIAGNOSIS — G40909 Epilepsy, unspecified, not intractable, without status epilepticus: Secondary | ICD-10-CM | POA: Diagnosis not present

## 2020-04-25 DIAGNOSIS — Z79899 Other long term (current) drug therapy: Secondary | ICD-10-CM

## 2020-04-25 DIAGNOSIS — E538 Deficiency of other specified B group vitamins: Secondary | ICD-10-CM

## 2020-04-25 DIAGNOSIS — Z8546 Personal history of malignant neoplasm of prostate: Secondary | ICD-10-CM

## 2020-04-25 DIAGNOSIS — Z125 Encounter for screening for malignant neoplasm of prostate: Secondary | ICD-10-CM

## 2020-04-25 DIAGNOSIS — E7800 Pure hypercholesterolemia, unspecified: Secondary | ICD-10-CM | POA: Diagnosis not present

## 2020-04-25 LAB — PSA, MEDICARE: PSA: 0 ng/ml — ABNORMAL LOW (ref 0.10–4.00)

## 2020-04-25 LAB — LIPID PANEL
Cholesterol: 212 mg/dL — ABNORMAL HIGH (ref 0–200)
HDL: 49.6 mg/dL (ref >39.00)
LDL Cholesterol: 137 mg/dL — ABNORMAL HIGH (ref 0–99)
NonHDL: 162.32
Total CHOL/HDL Ratio: 4
Triglycerides: 126 mg/dL (ref 0.0–149.0)
VLDL: 25.2 mg/dL (ref 0.0–40.0)

## 2020-04-25 LAB — VITAMIN B12: Vitamin B-12: 743 pg/mL (ref 211–911)

## 2020-04-26 LAB — PHENYTOIN LEVEL, TOTAL: Phenytoin, Total: 15.2 mg/L (ref 10.0–20.0)

## 2020-04-27 ENCOUNTER — Telehealth: Payer: Self-pay

## 2020-04-27 NOTE — Telephone Encounter (Signed)
Spoke with pt regarding labs. Pt understood.

## 2020-04-27 NOTE — Telephone Encounter (Signed)
Returning call regarding labs.  Because of time of call 4:50pm  Please call tomorrow. 10/15

## 2020-05-02 ENCOUNTER — Other Ambulatory Visit: Payer: Self-pay | Admitting: Family Medicine

## 2020-05-16 ENCOUNTER — Telehealth: Payer: Self-pay | Admitting: Family Medicine

## 2020-05-16 NOTE — Telephone Encounter (Signed)
Spoke with patient and she stated Wednesdays were not a good day for his AWV and will try to scheduled with provider.

## 2020-06-11 DIAGNOSIS — M47812 Spondylosis without myelopathy or radiculopathy, cervical region: Secondary | ICD-10-CM | POA: Diagnosis not present

## 2020-06-11 DIAGNOSIS — M542 Cervicalgia: Secondary | ICD-10-CM | POA: Diagnosis not present

## 2020-06-11 DIAGNOSIS — Z880 Allergy status to penicillin: Secondary | ICD-10-CM | POA: Diagnosis not present

## 2020-06-11 DIAGNOSIS — Z881 Allergy status to other antibiotic agents status: Secondary | ICD-10-CM | POA: Diagnosis not present

## 2020-06-11 DIAGNOSIS — Z91013 Allergy to seafood: Secondary | ICD-10-CM | POA: Diagnosis not present

## 2020-06-11 DIAGNOSIS — Z79899 Other long term (current) drug therapy: Secondary | ICD-10-CM | POA: Diagnosis not present

## 2020-06-11 DIAGNOSIS — M62838 Other muscle spasm: Secondary | ICD-10-CM | POA: Diagnosis not present

## 2020-06-11 DIAGNOSIS — M5031 Other cervical disc degeneration,  high cervical region: Secondary | ICD-10-CM | POA: Diagnosis not present

## 2020-06-26 DIAGNOSIS — L905 Scar conditions and fibrosis of skin: Secondary | ICD-10-CM | POA: Diagnosis not present

## 2020-07-03 ENCOUNTER — Encounter: Payer: Self-pay | Admitting: Hematology & Oncology

## 2020-07-03 ENCOUNTER — Other Ambulatory Visit: Payer: Self-pay

## 2020-07-03 ENCOUNTER — Encounter: Payer: Self-pay | Admitting: Family Medicine

## 2020-07-03 ENCOUNTER — Telehealth: Payer: Self-pay

## 2020-07-03 ENCOUNTER — Inpatient Hospital Stay (HOSPITAL_BASED_OUTPATIENT_CLINIC_OR_DEPARTMENT_OTHER): Payer: Medicare Other | Admitting: Hematology & Oncology

## 2020-07-03 ENCOUNTER — Ambulatory Visit (INDEPENDENT_AMBULATORY_CARE_PROVIDER_SITE_OTHER): Payer: Medicare Other | Admitting: Family Medicine

## 2020-07-03 ENCOUNTER — Inpatient Hospital Stay: Payer: Medicare Other | Attending: Hematology & Oncology

## 2020-07-03 VITALS — BP 154/92 | HR 56 | Temp 98.6°F | Resp 18 | Wt <= 1120 oz

## 2020-07-03 VITALS — BP 132/74 | HR 60 | Temp 97.6°F | Resp 16 | Ht 72.0 in | Wt 217.2 lb

## 2020-07-03 DIAGNOSIS — Z79899 Other long term (current) drug therapy: Secondary | ICD-10-CM | POA: Diagnosis not present

## 2020-07-03 DIAGNOSIS — I825Z1 Chronic embolism and thrombosis of unspecified deep veins of right distal lower extremity: Secondary | ICD-10-CM

## 2020-07-03 DIAGNOSIS — I82461 Acute embolism and thrombosis of right calf muscular vein: Secondary | ICD-10-CM | POA: Insufficient documentation

## 2020-07-03 DIAGNOSIS — M47812 Spondylosis without myelopathy or radiculopathy, cervical region: Secondary | ICD-10-CM

## 2020-07-03 DIAGNOSIS — M62838 Other muscle spasm: Secondary | ICD-10-CM

## 2020-07-03 DIAGNOSIS — Z7901 Long term (current) use of anticoagulants: Secondary | ICD-10-CM | POA: Diagnosis not present

## 2020-07-03 LAB — CBC WITH DIFFERENTIAL (CANCER CENTER ONLY)
Abs Immature Granulocytes: 0.06 10*3/uL (ref 0.00–0.07)
Basophils Absolute: 0 10*3/uL (ref 0.0–0.1)
Basophils Relative: 1 %
Eosinophils Absolute: 0.2 10*3/uL (ref 0.0–0.5)
Eosinophils Relative: 4 %
HCT: 43.8 % (ref 39.0–52.0)
Hemoglobin: 14.8 g/dL (ref 13.0–17.0)
Immature Granulocytes: 1 %
Lymphocytes Relative: 18 %
Lymphs Abs: 1 10*3/uL (ref 0.7–4.0)
MCH: 34.3 pg — ABNORMAL HIGH (ref 26.0–34.0)
MCHC: 33.8 g/dL (ref 30.0–36.0)
MCV: 101.6 fL — ABNORMAL HIGH (ref 80.0–100.0)
Monocytes Absolute: 0.6 10*3/uL (ref 0.1–1.0)
Monocytes Relative: 11 %
Neutro Abs: 3.8 10*3/uL (ref 1.7–7.7)
Neutrophils Relative %: 65 %
Platelet Count: 223 10*3/uL (ref 150–400)
RBC: 4.31 MIL/uL (ref 4.22–5.81)
RDW: 12.6 % (ref 11.5–15.5)
WBC Count: 5.7 10*3/uL (ref 4.0–10.5)
nRBC: 0 % (ref 0.0–0.2)

## 2020-07-03 LAB — CMP (CANCER CENTER ONLY)
ALT: 10 U/L (ref 0–44)
AST: 14 U/L — ABNORMAL LOW (ref 15–41)
Albumin: 4.1 g/dL (ref 3.5–5.0)
Alkaline Phosphatase: 110 U/L (ref 38–126)
Anion gap: 7 (ref 5–15)
BUN: 19 mg/dL (ref 8–23)
CO2: 28 mmol/L (ref 22–32)
Calcium: 9.3 mg/dL (ref 8.9–10.3)
Chloride: 105 mmol/L (ref 98–111)
Creatinine: 0.93 mg/dL (ref 0.61–1.24)
GFR, Estimated: >60 mL/min (ref >60)
Glucose, Bld: 78 mg/dL (ref 70–99)
Potassium: 4.9 mmol/L (ref 3.5–5.1)
Sodium: 140 mmol/L (ref 135–145)
Total Bilirubin: 0.4 mg/dL (ref 0.3–1.2)
Total Protein: 6.6 g/dL (ref 6.5–8.1)

## 2020-07-03 MED ORDER — XARELTO 2.5 MG PO TABS: 5.0000 mg | ORAL_TABLET | Freq: Every day | ORAL | 5 refills | Status: DC

## 2020-07-03 NOTE — Patient Instructions (Signed)
If [facility] needs new prescriptions for your medications then call our office and tell us which ones and we'll send them electronically.

## 2020-07-03 NOTE — Progress Notes (Signed)
Hematology and Oncology Follow Up Visit  Adam Melton [id number] 1940-12-26 79 y.o. 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  Overall, he seems to be doing pretty well.  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  It sounds very interesting.  I will look forward to when it is published.  He is doing well with the Xarelto.  He really has had no problems with respect to Xarelto.  That a very nice Thanksgiving in the [employer].  Some family came over from [place].  He has had no bleeding.  He has had no change in bowel or bladder habits.  He has had no cough.  There is been no leg swelling.  He has had no rashes.  Overall, I would say his performance status is ECOG 1.   Medications:  Current Outpatient Medications:  .  Ascorbic Acid (VITAMIN C PO), Take by mouth daily., Disp: , Rfl:  .  busPIRone (BUSPAR) 5 MG tablet, Take 1 tablet (5 mg total) by mouth daily., Disp: 90 tablet, Rfl: 1 .  Cholecalciferol (VITAMIN D PO), Take by mouth., Disp: , Rfl:  .  Cyanocobalamin (B-12 PO), Take by mouth daily., Disp: , Rfl:  .  DILANTIN 100 MG ER capsule, TAKE TWO CAPSULES BY MOUTH EVERY MORNING AND TAKE THREE CAPSULES AT BEDTIME, Disp: 450 capsule, Rfl: 0 .  ketoconazole (NIZORAL) 2 % shampoo, APPLY TOPICALLY TWO TIMES A WEEK, Disp: , Rfl:  .  latanoprost (XALATAN) 0.005 % ophthalmic solution, instill one drop in Adam Melton eye AT BEDTIME, Disp: , Rfl: 5 .  timolol (TIMOPTIC) 0.5 % ophthalmic solution, 1 drop 2 (two) times daily., Disp: , Rfl:  .  XARELTO 2.5 MG TABS tablet, Take 2 tablets (5 mg total) by mouth daily., Disp: 60 tablet, Rfl: 5  Allergies:  Allergies  Allergen Reactions  . Azithromycin Other (See Comments)    seizure  . Penicillins Nausea And Vomiting and Rash  . Shellfish Allergy  Swelling    Past Medical History, Surgical history, Social history, and Family History were reviewed and updated.  Review of Systems: Review of Systems  Constitutional: Negative.   HENT: Negative.   Eyes: Negative.   Respiratory: Negative.   Cardiovascular: Negative.   Gastrointestinal: Negative.   Genitourinary: Negative.   Musculoskeletal: Negative.   Skin: Negative.   Neurological: Negative.   Endo/Heme/Allergies: Negative.   Psychiatric/Behavioral: Negative.      Physical Exam:  weight is 21 lb (9.526 kg). His oral temperature is 98.6 F (37 C). His blood pressure is 154/92 (abnormal) and his pulse is 56 (abnormal). His respiration is 18 and oxygen saturation is 99%.   Wt Readings from Last 3 Encounters:  07/03/20 21 lb (9.526 kg)  04/24/20 213 lb 9.6 oz (96.9 kg)  11/01/19 215 lb (97.5 kg)     Physical Exam Vitals reviewed.  HENT:     Head: Normocephalic and atraumatic.  Eyes:     Pupils: Pupils are equal, round, and reactive to light.  Cardiovascular:     Rate and Rhythm: Normal rate and regular rhythm.     Heart sounds: Normal heart sounds.  Pulmonary:     Effort: Pulmonary effort is normal.     Breath sounds: Normal breath sounds.  Abdominal:     General: Bowel  sounds are normal.     Palpations: Abdomen is soft.  Musculoskeletal:        General: No tenderness or deformity. Normal range of motion.     Cervical back: Normal range of motion.  Lymphadenopathy:     Cervical: No cervical adenopathy.  Skin:    General: Skin is warm and dry.     Findings: No erythema or rash.  Neurological:     Mental Status: He is alert and oriented to person, place, and time.  Psychiatric:        Behavior: Behavior normal.        Thought Content: Thought content normal.        Judgment: Judgment normal.   Is Lab Results  Component Value Date   WBC 5.7 07/03/2020   HGB 14.8 07/03/2020   HCT 43.8 07/03/2020   MCV 101.6 (H) 07/03/2020   PLT 223 07/03/2020      Chemistry      Component Value Date/Time   NA 140 07/03/2020 1049   NA 144 01/03/2017 1114   K 4.9 07/03/2020 1049   K 4.5 01/03/2017 1114   CL 105 07/03/2020 1049   CL 108 01/03/2017 1114   CO2 28 07/03/2020 1049   CO2 32 01/03/2017 1114   BUN 19 07/03/2020 1049   BUN 13 01/03/2017 1114   CREATININE 0.93 07/03/2020 1049   CREATININE 1.1 01/03/2017 1114      Component Value Date/Time   CALCIUM 9.3 07/03/2020 1049   CALCIUM 9.1 01/03/2017 1114   ALKPHOS 110 07/03/2020 1049   ALKPHOS 125 (H) 01/03/2017 1114   AST 14 (L) 07/03/2020 1049   ALT 10 07/03/2020 1049   ALT 22 01/03/2017 1114   BILITOT 0.4 07/03/2020 1049      Impression and Plan: Adam Melton is a 79 year old white male. He developed a thrombus in the right lower leg. He has had prior thromboembolic disease. He was taken off blood thinner with Coumadin and then developed another thrombus.  Again, he is on lifelong anticoagulation from my point of view.  I think 5 mg is appropriate for him.  I just do not think that this should not cause any problems and that it should be effective.  I will plan to see him back in 6 more months.    [name], MD 12/20/202112:34 PM

## 2020-07-03 NOTE — Progress Notes (Signed)
OFFICE VISIT  07/03/2020  CC:  Chief Complaint  Patient presents with  . Follow-up    ED visit on 11/28    HPI:    Patient is a 79 y.o. Caucasian male who presents for f/u [facility] ED 06/11/20 visit for neck pain. Dx'd with neck muscle spasm.  I reviewed record of the entire ED encounter today. He's doing fine other than some neck stiffness, no pain. Neck stiffness is a chronic problem for him.    Past Medical History:  Diagnosis Date  . Anxiety   . Baker's cyst of knee 03/2016   Right  . Cataract    Bilat; no surgery  . Closed head injury 2006  . Colon polyps 11/2008 & 2017  . Diverticulosis 11/2008  . DVT of lower limb, acute ([place]) 03/19/2016   (recurrent-->first one was L saph vein in 2015.  2017 Right gastroc (xarelto started).  Full thrombophilia panel by Dr. Marin Olp 09/2016 was NORMAL.  Dr. Marin Olp recommended low dose xarelto continuation in order to decrease the risk of recurrent DVT (finished 1 yr of full dose anticoag 03/2017).  Xarelto 5mg  qd indefinitely as per 03/2018 and 11/2018 hem f/u.  Marland Kitchen Glaucoma    Primary open angle glaucoma OU, mild stage [name], MD)-stable as of 09/2017 ophth f/u.  Marland Kitchen Hyperlipidemia 11/2015   Crestor tried briefly but he self d/c'd the med, basically favors TLC   . 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 Lung nodule 10/2014   calcified granulomata  . Nephrolithiasis   . Osteopenia 2009   By DEXA  . Prostate cancer ([place])    robotic prostectomy   . 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  Last seizure 2007.  Marland Kitchen Skull fracture ([name])    > 30 years ago; now with seizure d/o  . 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  (Ptosis and meiosis)  . Thrombosis of saphenous vein 2015   Left; PCP in [place] put him on xarelto x 3 mo.  F/u  ultrasound was NORMAL.  . Tricompartmental disease of knee 2014   right knee  . Vitamin B12 deficiency 04/29/2019   rpt in low normal range (before any replacment b12), IF ab NEG, methylmalonic acid level normal.    Past Surgical History:  Procedure Laterality Date  . CARPAL TUNNEL RELEASE Right   . CARPAL TUNNEL RELEASE    . COLONOSCOPY W/ POLYPECTOMY  12/02/08; 10/12/15   Recall 3 yrs (Dr. Henrene Pastor)  . DEXA  09/02/07   Osteopenia  . INCISION AND DRAINAGE Left    knee  . LITHOTRIPSY    . ROBOT ASSISTED LAPAROSCOPIC RADICAL PROSTATECTOMY  2008  . SUBDURAL HEMATOMA EVACUATION VIA CRANIOTOMY  2006   + temporal intracerebral hematoma--surgical drainage  . 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    Outpatient Medications Prior to Visit  Medication Sig Dispense Refill  . Ascorbic Acid (VITAMIN C PO) Take by mouth daily.    . busPIRone (BUSPAR) 5 MG tablet Take 1 tablet (5 mg total) by mouth daily. 90 tablet 1  . Cholecalciferol (VITAMIN D PO) Take by mouth.    . Cyanocobalamin (B-12 PO) Take by mouth daily.    Marland Kitchen  DILANTIN 100 MG ER capsule TAKE TWO CAPSULES BY MOUTH EVERY MORNING AND TAKE THREE CAPSULES AT BEDTIME 450 capsule 0  . ketoconazole (NIZORAL) 2 % shampoo APPLY TOPICALLY TWO TIMES A WEEK    . latanoprost (XALATAN) 0.005 % ophthalmic solution instill one drop in EACH eye AT BEDTIME  5  . rivaroxaban (XARELTO) 2.5 MG TABS tablet Take 2 tablets (5 mg total) by mouth daily. 60 tablet 5  . timolol (TIMOPTIC) 0.5 % ophthalmic solution 1 drop 2 (two) times daily.     No facility-administered medications prior to visit.    Allergies  Allergen Reactions  . Azithromycin Other (See Comments)    seizure  . Penicillins Nausea And Vomiting and Rash  . Shellfish Allergy Swelling    ROS As per HPI  PE: Vitals with BMI 07/03/2020 07/03/2020 04/24/2020  Height 6\' 0"  - 6\' 0"   Weight 217 lbs 3 oz 21  lbs 213 lbs 10 oz  BMI 41.32 - 44.01  Systolic 027 253 664  Diastolic 74 92 97  Pulse 60 56 58   Gen: Alert, well appearing.  Patient is oriented to person, place, time, and situation. AFFECT: pleasant, lucid thought and speech. Neck: no tenderness or palpable spasm.  ROM intact 100% flexion and to about 80-90% of full extension, lateral bending, and rotation.  LABS:  none  IMPRESSION AND PLAN:  Neck muscles spasm, resolved. Discussed ROM exercises, prn heat and prn otc lidocaine patches.   He does have some mild chronic stiffness, +s degen arth C-spineignif on noncont CT imaging. He declined my offer of PT, wants to stick with preventative measures at this point. He knows to avoid NSAIDs if he is in pain-->he takes xarelto for hx of recurrent DVT.  An After Visit Summary was printed and given to the patient.  FOLLOW UP:  Next "routine" f/u with me 4 mo  Signed:  Crissie Sickles, MD           07/03/2020

## 2020-07-03 NOTE — Telephone Encounter (Signed)
appts made and printed for pt per request, no los sent yet,    aom

## 2020-07-21 ENCOUNTER — Other Ambulatory Visit: Payer: Self-pay | Admitting: Family Medicine

## 2020-09-27 DIAGNOSIS — L821 Other seborrheic keratosis: Secondary | ICD-10-CM | POA: Diagnosis not present

## 2020-09-27 DIAGNOSIS — L720 Epidermal cyst: Secondary | ICD-10-CM | POA: Diagnosis not present

## 2020-09-27 DIAGNOSIS — D692 Other nonthrombocytopenic purpura: Secondary | ICD-10-CM | POA: Diagnosis not present

## 2020-09-27 DIAGNOSIS — Z85828 Personal history of other malignant neoplasm of skin: Secondary | ICD-10-CM | POA: Diagnosis not present

## 2020-09-27 DIAGNOSIS — D235 Other benign neoplasm of skin of trunk: Secondary | ICD-10-CM | POA: Diagnosis not present

## 2020-09-27 DIAGNOSIS — D485 Neoplasm of uncertain behavior of skin: Secondary | ICD-10-CM | POA: Diagnosis not present

## 2020-09-27 DIAGNOSIS — L570 Actinic keratosis: Secondary | ICD-10-CM | POA: Diagnosis not present

## 2020-09-27 DIAGNOSIS — L820 Inflamed seborrheic keratosis: Secondary | ICD-10-CM | POA: Diagnosis not present

## 2020-10-11 DIAGNOSIS — L720 Epidermal cyst: Secondary | ICD-10-CM | POA: Diagnosis not present

## 2020-10-24 ENCOUNTER — Other Ambulatory Visit: Payer: Self-pay | Admitting: Family Medicine

## 2020-11-04 DIAGNOSIS — G8911 Acute pain due to trauma: Secondary | ICD-10-CM | POA: Diagnosis not present

## 2020-11-04 DIAGNOSIS — Z880 Allergy status to penicillin: Secondary | ICD-10-CM | POA: Diagnosis not present

## 2020-11-04 DIAGNOSIS — Z883 Allergy status to other anti-infective agents status: Secondary | ICD-10-CM | POA: Diagnosis not present

## 2020-11-04 DIAGNOSIS — Z23 Encounter for immunization: Secondary | ICD-10-CM | POA: Diagnosis not present

## 2020-11-04 DIAGNOSIS — S0990XA Unspecified injury of head, initial encounter: Secondary | ICD-10-CM | POA: Diagnosis not present

## 2020-11-04 DIAGNOSIS — S199XXA Unspecified injury of neck, initial encounter: Secondary | ICD-10-CM | POA: Diagnosis not present

## 2020-11-04 DIAGNOSIS — Z7901 Long term (current) use of anticoagulants: Secondary | ICD-10-CM | POA: Diagnosis not present

## 2020-11-04 DIAGNOSIS — M6289 Other specified disorders of muscle: Secondary | ICD-10-CM | POA: Diagnosis not present

## 2020-11-04 DIAGNOSIS — Z86718 Personal history of other venous thrombosis and embolism: Secondary | ICD-10-CM | POA: Diagnosis not present

## 2020-11-04 DIAGNOSIS — Z87820 Personal history of traumatic brain injury: Secondary | ICD-10-CM | POA: Diagnosis not present

## 2020-11-04 DIAGNOSIS — S0101XA Laceration without foreign body of scalp, initial encounter: Secondary | ICD-10-CM | POA: Diagnosis not present

## 2020-11-10 ENCOUNTER — Ambulatory Visit: Payer: Medicare HMO | Admitting: Family Medicine

## 2020-11-10 ENCOUNTER — Telehealth: Payer: Self-pay

## 2020-11-10 NOTE — Telephone Encounter (Signed)
Please assist pt with scheduling at 4 and arriving at 345. Thanks

## 2020-11-10 NOTE — Telephone Encounter (Signed)
Patient needs to have 8 staples removed from the back of his neck/shoulder area today.  (ED notes are in epic)  I thought I had sent a message to Dr. McGowen/Britt earlier in the week when patient called to see when Dr. Anitra Lauth could schedule him to come in.    I have 4pm appt blocked for today, is this okay for patient to come in?  He will be the only patient at 4pm, I will have patient check in at 3:45pm..  Thank you.

## 2020-11-13 ENCOUNTER — Ambulatory Visit (INDEPENDENT_AMBULATORY_CARE_PROVIDER_SITE_OTHER): Payer: Medicare HMO | Admitting: Family Medicine

## 2020-11-13 ENCOUNTER — Encounter: Payer: Self-pay | Admitting: Family Medicine

## 2020-11-13 ENCOUNTER — Other Ambulatory Visit: Payer: Self-pay

## 2020-11-13 VITALS — BP 112/73 | HR 66 | Temp 98.0°F | Resp 16 | Ht 72.0 in | Wt 214.2 lb

## 2020-11-13 DIAGNOSIS — S0101XD Laceration without foreign body of scalp, subsequent encounter: Secondary | ICD-10-CM

## 2020-11-13 DIAGNOSIS — W19XXXD Unspecified fall, subsequent encounter: Secondary | ICD-10-CM | POA: Diagnosis not present

## 2020-11-13 DIAGNOSIS — Z4802 Encounter for removal of sutures: Secondary | ICD-10-CM

## 2020-11-13 NOTE — Progress Notes (Signed)
OFFICE VISIT  11/13/2020  CC:  Chief Complaint  Patient presents with  . Staple removal   HPI:    Patient is a 80 y.o. Caucasian male who presents for staples removal from back of head. Pt sustained a fall while in the shower (hit the back of his head on toilet) 11/04/20 and went to [facility] ED.  He had no LOC. CT imaging of head and neck were neg for acute injury. He has remained on xarelto 5mg  qd as prophylaxis for DVT/hx of recurrent DVT. He complained of tightness in L lower leg so they also did a LE venous doppler to r/o DVT and this was NORMAL.  CURRENTLY: Feeling well, no headaches or neck pain. Mild feeling of L lower leg "tightness" but no pain or swelling---seems chronic. Note above info->LE venous doppler in ED NEG. He remains on his 5mg  qd xarelto. No bleeding from the wound since staples were placed. No fevers or malaise.   Past Medical History:  Diagnosis Date  . Anxiety   . Baker's cyst of knee 03/2016   Right  . Cataract    Bilat; no surgery  . Closed head injury 2006  . Colon polyps 11/2008 & 2017  . Diverticulosis 11/2008  . DVT of lower limb, acute ([place]) 03/19/2016   (recurrent-->first one was L saph vein in 2015.  2017 Right gastroc (xarelto started).  Full thrombophilia panel by Dr. Marin Olp 09/2016 was NORMAL.  Dr. Marin Olp recommended low dose xarelto continuation in order to decrease the risk of recurrent DVT (finished 1 yr of full dose anticoag 03/2017).  Xarelto 5mg  qd indefinitely as per 03/2018 and 11/2018 hem f/u.  Marland Kitchen Glaucoma    Primary open angle glaucoma OU, mild stage [name], MD)-stable as of 09/2017 ophth f/u.  Marland Kitchen Hyperlipidemia 11/2015   Crestor tried briefly but he self d/c'd the med, basically favors TLC   . 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 Lung nodule 10/2014   calcified granulomata  . Nephrolithiasis   . Osteopenia 2009   By  DEXA  . 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  Last seizure 2007.  Marland Kitchen Skull fracture ([name])    > 30 years ago; now with seizure d/o  . 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  (Ptosis and meiosis)  . Thrombosis of saphenous vein 2015   Left; PCP in [place] put him on xarelto x 3 mo.  F/u ultrasound was NORMAL.  . Tricompartmental disease of knee 2014   right knee  . Vitamin B12 deficiency 04/29/2019   rpt in low normal range (before any replacment b12), IF ab NEG, methylmalonic acid level normal.    Past Surgical History:  Procedure Laterality Date  . CARPAL TUNNEL RELEASE Right   . CARPAL TUNNEL RELEASE    . COLONOSCOPY W/ POLYPECTOMY  12/02/08; 10/12/15   Recall 3 yrs (Dr. Henrene Pastor)  . DEXA  09/02/07   Osteopenia  . INCISION AND DRAINAGE Left    knee  . LITHOTRIPSY    . ROBOT ASSISTED LAPAROSCOPIC RADICAL PROSTATECTOMY  2008  . SUBDURAL HEMATOMA EVACUATION VIA CRANIOTOMY  2006   + temporal intracerebral hematoma--surgical drainage  . venous doppler u/s bilat  09/27/2016   IMPRESSION:  minimal chronic DVT in R gastroc vein; no acute DVT, bilat baker's cysts.    Outpatient Medications Prior to Visit  Medication Sig Dispense Refill  . Ascorbic Acid (VITAMIN C PO) Take by mouth daily.    . busPIRone (BUSPAR) 5 MG tablet Take 1 tablet (5 mg total) by mouth daily. 30 tablet 0  . Cholecalciferol (VITAMIN D PO) Take by mouth.    . Cyanocobalamin (B-12 PO) Take by mouth daily.    Marland Kitchen DILANTIN 100 MG ER capsule TAKE TWO CAPSULES BY MOUTH EVERY MORNING AND TAKE THREE CAPSULES AT BEDTIME 450 capsule 1  . latanoprost (XALATAN) 0.005 % ophthalmic solution instill one drop in EACH eye AT BEDTIME  5  . rivaroxaban (XARELTO) 2.5 MG TABS tablet Take 2 tablets (5 mg total) by mouth daily. 60 tablet 5  . timolol (TIMOPTIC) 0.5 % ophthalmic solution 1 drop 2  (two) times daily.    Marland Kitchen ketoconazole (NIZORAL) 2 % shampoo APPLY TOPICALLY TWO TIMES A WEEK (Patient not taking: Reported on 11/13/2020)    . PFIZER-BIONTECH COVID-19 VACC 30 MCG/0.3ML injection  (Patient not taking: Reported on 11/13/2020)     No facility-administered medications prior to visit.    Allergies  Allergen Reactions  . Azithromycin Other (See Comments)    seizure  . Penicillins Nausea And Vomiting and Rash  . Shellfish Allergy Swelling    ROS As per HPI  PE: Vitals with BMI 11/13/2020 07/03/2020 07/03/2020  Height 6\' 0"  6\' 0"  -  Weight 214 lbs 3 oz 217 lbs 3 oz 21 lbs  BMI 55.73 22.02 -  Systolic 542 706 237  Diastolic 73 74 92  Pulse 66 60 56     Gen: Alert, well appearing.  Patient is oriented to person, place, time, and situation. AFFECT: pleasant, lucid thought and speech. Occiput: no bruising or deformity or tenderness. Horizontal lac over occiput is w/out erythema.  Edges well approximated, 8 staples in place.  No bleeding or wound exudate.  LABS:    Chemistry      Component Value Date/Time   NA 140 07/03/2020 1049   NA 144 01/03/2017 1114   K 4.9 07/03/2020 1049   K 4.5 01/03/2017 1114   CL 105 07/03/2020 1049   CL 108 01/03/2017 1114   CO2 28 07/03/2020 1049   CO2 32 01/03/2017 1114   BUN 19 07/03/2020 1049   BUN 13 01/03/2017 1114   CREATININE 0.93 07/03/2020 1049   CREATININE 1.1 01/03/2017 1114      Component Value Date/Time   CALCIUM 9.3 07/03/2020 1049   CALCIUM 9.1 01/03/2017 1114   ALKPHOS 110 07/03/2020 1049   ALKPHOS 125 (H) 01/03/2017 1114   AST 14 (L) 07/03/2020 1049   ALT 10 07/03/2020 1049   ALT 22 01/03/2017 1114   BILITOT 0.4 07/03/2020 1049     Lab Results  Component Value Date   WBC 5.7 07/03/2020   HGB 14.8 07/03/2020   HCT 43.8 07/03/2020   MCV 101.6 (H) 07/03/2020   PLT 223 07/03/2020   Lab Results  Component Value Date   [id number] 607 04/25/2020   Lab Results  Component Value Date   PSA1 <0.1 02/11/2018    PSA 0.00 (L) 04/25/2020   PSA 0.00 (L) 04/27/2019   PSA 0.01 (L) 12/18/2017   IMPRESSION AND PLAN:  Scalp laceration sustained 9 d/a, wound healed well, staples x 8 removed today (all). Wound care discussed.  L leg "tight" feeling: recent LE venous doppler ruled out acute  DVT. 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 Cont xarelto 5mg  qd indefinitely for hx of recurrent DVT.   An After Visit Summary was printed and given to the patient.  FOLLOW UP: No follow-ups on file. F/u chronic probs can be 04/2021--rpt labs at that time, including annual dilantin trough.  Signed:  Crissie Sickles, MD           11/13/2020

## 2020-11-13 NOTE — Telephone Encounter (Signed)
Patient scheduled for appt 5/2;  patient to check in at 3:45PM

## 2020-11-15 ENCOUNTER — Ambulatory Visit: Payer: Medicare Other | Admitting: Family Medicine

## 2020-11-30 ENCOUNTER — Other Ambulatory Visit: Payer: Self-pay | Admitting: Family Medicine

## 2020-12-01 DIAGNOSIS — H401131 Primary open-angle glaucoma, bilateral, mild stage: Secondary | ICD-10-CM | POA: Diagnosis not present

## 2020-12-01 DIAGNOSIS — H4901 Third [oculomotor] nerve palsy, right eye: Secondary | ICD-10-CM | POA: Diagnosis not present

## 2020-12-01 DIAGNOSIS — H43812 Vitreous degeneration, left eye: Secondary | ICD-10-CM | POA: Diagnosis not present

## 2020-12-01 DIAGNOSIS — H25813 Combined forms of age-related cataract, bilateral: Secondary | ICD-10-CM | POA: Diagnosis not present

## 2020-12-01 DIAGNOSIS — H5053 Vertical heterophoria: Secondary | ICD-10-CM | POA: Diagnosis not present

## 2020-12-01 DIAGNOSIS — H527 Unspecified disorder of refraction: Secondary | ICD-10-CM | POA: Diagnosis not present

## 2020-12-01 DIAGNOSIS — H02431 Paralytic ptosis of right eyelid: Secondary | ICD-10-CM | POA: Diagnosis not present

## 2020-12-28 ENCOUNTER — Other Ambulatory Visit: Payer: Self-pay | Admitting: Family Medicine

## 2021-01-01 ENCOUNTER — Inpatient Hospital Stay: Payer: Medicare HMO | Attending: Hematology & Oncology

## 2021-01-01 ENCOUNTER — Inpatient Hospital Stay: Payer: Medicare HMO | Admitting: Hematology & Oncology

## 2021-01-01 ENCOUNTER — Other Ambulatory Visit: Payer: Self-pay

## 2021-01-01 ENCOUNTER — Telehealth: Payer: Self-pay

## 2021-01-01 ENCOUNTER — Encounter: Payer: Self-pay | Admitting: Hematology & Oncology

## 2021-01-01 VITALS — BP 164/91 | HR 70 | Temp 97.9°F | Resp 18 | Wt 219.0 lb

## 2021-01-01 DIAGNOSIS — Z7901 Long term (current) use of anticoagulants: Secondary | ICD-10-CM | POA: Insufficient documentation

## 2021-01-01 DIAGNOSIS — Z79899 Other long term (current) drug therapy: Secondary | ICD-10-CM | POA: Diagnosis not present

## 2021-01-01 DIAGNOSIS — Z86718 Personal history of other venous thrombosis and embolism: Secondary | ICD-10-CM | POA: Diagnosis not present

## 2021-01-01 DIAGNOSIS — I825Z1 Chronic embolism and thrombosis of unspecified deep veins of right distal lower extremity: Secondary | ICD-10-CM | POA: Diagnosis not present

## 2021-01-01 LAB — CBC WITH DIFFERENTIAL (CANCER CENTER ONLY)
Abs Immature Granulocytes: 0.03 10*3/uL (ref 0.00–0.07)
Basophils Absolute: 0.1 10*3/uL (ref 0.0–0.1)
Basophils Relative: 1 %
Eosinophils Absolute: 0.2 10*3/uL (ref 0.0–0.5)
Eosinophils Relative: 4 %
HCT: 41.5 % (ref 39.0–52.0)
Hemoglobin: 14.4 g/dL (ref 13.0–17.0)
Immature Granulocytes: 1 %
Lymphocytes Relative: 18 %
Lymphs Abs: 1 10*3/uL (ref 0.7–4.0)
MCH: 34.8 pg — ABNORMAL HIGH (ref 26.0–34.0)
MCHC: 34.7 g/dL (ref 30.0–36.0)
MCV: 100.2 fL — ABNORMAL HIGH (ref 80.0–100.0)
Monocytes Absolute: 0.6 10*3/uL (ref 0.1–1.0)
Monocytes Relative: 11 %
Neutro Abs: 3.6 10*3/uL (ref 1.7–7.7)
Neutrophils Relative %: 65 %
Platelet Count: 188 10*3/uL (ref 150–400)
RBC: 4.14 MIL/uL — ABNORMAL LOW (ref 4.22–5.81)
RDW: 13 % (ref 11.5–15.5)
WBC Count: 5.6 10*3/uL (ref 4.0–10.5)
nRBC: 0 % (ref 0.0–0.2)

## 2021-01-01 LAB — CMP (CANCER CENTER ONLY)
ALT: 11 U/L (ref 0–44)
AST: 15 U/L (ref 15–41)
Albumin: 4.1 g/dL (ref 3.5–5.0)
Alkaline Phosphatase: 121 U/L (ref 38–126)
Anion gap: 7 (ref 5–15)
BUN: 19 mg/dL (ref 8–23)
CO2: 28 mmol/L (ref 22–32)
Calcium: 9.2 mg/dL (ref 8.9–10.3)
Chloride: 103 mmol/L (ref 98–111)
Creatinine: 0.93 mg/dL (ref 0.61–1.24)
GFR, Estimated: >60 mL/min (ref >60)
Glucose, Bld: 75 mg/dL (ref 70–99)
Potassium: 4.4 mmol/L (ref 3.5–5.1)
Sodium: 138 mmol/L (ref 135–145)
Total Bilirubin: 0.4 mg/dL (ref 0.3–1.2)
Total Protein: 6.4 g/dL — ABNORMAL LOW (ref 6.5–8.1)

## 2021-01-01 NOTE — Telephone Encounter (Signed)
Appts made and printed for pt per 01/01/21 los  Adam Melton

## 2021-01-01 NOTE — Progress Notes (Signed)
Hematology and Oncology Follow Up Visit  Adam Melton [id number] 03/04/1941 80 y.o. 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  I saw him 6 months ago.  He is doing pretty well.  He has had no problems with the Xarelto.  He does have some varicose veins.  Is wondering about these.  I told him that they are more of a problem visually than with respect to getting blood clots.  He has had no problems with bleeding.  Is been no problems with cough or shortness of breath.  He has had no issues with COVID.  He and his wife will be going up to [place] to visit a daughter in a few weeks.  He has had no problems with rashes.  He has had no change in bowel or bladder habits.  Has been no problems with headache.   Medications:  Current Outpatient Medications:    Ascorbic Acid (VITAMIN C PO), Take by mouth daily., Disp: , Rfl:    busPIRone (BUSPAR) 5 MG tablet, Take 1 tablet (5 mg total) by mouth daily., Disp: 90 tablet, Rfl: 1   Cholecalciferol (VITAMIN D PO), Take by mouth., Disp: , Rfl:    Cyanocobalamin (B-12 PO), Take by mouth daily., Disp: , Rfl:    DILANTIN 100 MG ER capsule, TAKE TWO CAPSULES BY MOUTH EVERY MORNING AND TAKE THREE CAPSULES AT BEDTIME, Disp: 450 capsule, Rfl: 1   latanoprost (XALATAN) 0.005 % ophthalmic solution, instill one drop in [facility] eye AT BEDTIME, Disp: , Rfl: 5   rivaroxaban (XARELTO) 2.5 MG TABS tablet, Take 2 tablets (5 mg total) by mouth daily., Disp: 60 tablet, Rfl: 5   timolol (TIMOPTIC) 0.5 % ophthalmic solution, 1 drop 2 (two) times daily., Disp: , Rfl:   Allergies:  Allergies  Allergen Reactions   Azithromycin Other (See Comments)    seizure   Penicillins Nausea And Vomiting and Rash   Shellfish Allergy Swelling    Past Medical History, Surgical history, Social history, and  Family History were reviewed and updated.  Review of Systems: Review of Systems  Constitutional: Negative.   HENT: Negative.    Eyes: Negative.   Respiratory: Negative.    Cardiovascular: Negative.   Gastrointestinal: Negative.   Genitourinary: Negative.   Musculoskeletal: Negative.   Skin: Negative.   Neurological: Negative.   Endo/Heme/Allergies: Negative.   Psychiatric/Behavioral: Negative.      Physical Exam:  weight is 219 lb (99.3 kg). His oral temperature is 97.9 F (36.6 C). His blood pressure is 164/91 (abnormal) and his pulse is 70. His respiration is 18 and oxygen saturation is 99%.   Wt Readings from Last 3 Encounters:  01/01/21 219 lb (99.3 kg)  11/13/20 214 lb 3.2 oz (97.2 kg)  07/03/20 217 lb 3.2 oz (98.5 kg)     Physical Exam Vitals reviewed.  HENT:     Head: Normocephalic and atraumatic.  Eyes:     Pupils: Pupils are equal, round, and reactive to light.  Cardiovascular:     Rate and Rhythm: Normal rate and regular rhythm.     Heart sounds: Normal heart sounds.  Pulmonary:     Effort: Pulmonary effort is normal.     Breath sounds: Normal breath sounds.  Abdominal:     General: Bowel sounds are normal.  Palpations: Abdomen is soft.  Musculoskeletal:        General: No tenderness or deformity. Normal range of motion.     Cervical back: Normal range of motion.  Lymphadenopathy:     Cervical: No cervical adenopathy.  Skin:    General: Skin is warm and dry.     Findings: No erythema or rash.  Neurological:     Mental Status: He is alert and oriented to person, place, and time.  Psychiatric:        Behavior: Behavior normal.        Thought Content: Thought content normal.        Judgment: Judgment normal.  Is Lab Results  Component Value Date   WBC 5.6 01/01/2021   HGB 14.4 01/01/2021   HCT 41.5 01/01/2021   MCV 100.2 (H) 01/01/2021   PLT 188 01/01/2021     Chemistry      Component Value Date/Time   NA 138 01/01/2021 1117   NA 144  01/03/2017 1114   K 4.4 01/01/2021 1117   K 4.5 01/03/2017 1114   CL 103 01/01/2021 1117   CL 108 01/03/2017 1114   CO2 28 01/01/2021 1117   CO2 32 01/03/2017 1114   BUN 19 01/01/2021 1117   BUN 13 01/03/2017 1114   CREATININE 0.93 01/01/2021 1117   CREATININE 1.1 01/03/2017 1114      Component Value Date/Time   CALCIUM 9.2 01/01/2021 1117   CALCIUM 9.1 01/03/2017 1114   ALKPHOS 121 01/01/2021 1117   ALKPHOS 125 (H) 01/03/2017 1114   AST 15 01/01/2021 1117   ALT 11 01/01/2021 1117   ALT 22 01/03/2017 1114   BILITOT 0.4 01/01/2021 1117      Impression and Plan: Adam Melton is a 80 year old white male. He developed a thrombus in the right lower leg. He has had prior thromboembolic disease. He was taken off blood thinner with Coumadin and then developed another thrombus.  Again, he is on lifelong anticoagulation from my point of view.  I think 5 mg is appropriate for him.  I just do not think that this should not cause any problems and that it should be effective.  I will plan to see him back in 6 more months.    [name], MD 6/20/20221:05 PM

## 2021-01-21 ENCOUNTER — Other Ambulatory Visit: Payer: Self-pay | Admitting: Family Medicine

## 2021-02-14 DIAGNOSIS — H401131 Primary open-angle glaucoma, bilateral, mild stage: Secondary | ICD-10-CM | POA: Diagnosis not present

## 2021-02-14 DIAGNOSIS — H5053 Vertical heterophoria: Secondary | ICD-10-CM | POA: Diagnosis not present

## 2021-02-14 DIAGNOSIS — H4901 Third [oculomotor] nerve palsy, right eye: Secondary | ICD-10-CM | POA: Diagnosis not present

## 2021-02-14 DIAGNOSIS — H02431 Paralytic ptosis of right eyelid: Secondary | ICD-10-CM | POA: Diagnosis not present

## 2021-02-14 DIAGNOSIS — H25813 Combined forms of age-related cataract, bilateral: Secondary | ICD-10-CM | POA: Diagnosis not present

## 2021-02-14 DIAGNOSIS — H43812 Vitreous degeneration, left eye: Secondary | ICD-10-CM | POA: Diagnosis not present

## 2021-02-14 DIAGNOSIS — H527 Unspecified disorder of refraction: Secondary | ICD-10-CM | POA: Diagnosis not present

## 2021-02-14 DIAGNOSIS — H52202 Unspecified astigmatism, left eye: Secondary | ICD-10-CM | POA: Diagnosis not present

## 2021-03-05 ENCOUNTER — Other Ambulatory Visit: Payer: Self-pay | Admitting: Hematology & Oncology

## 2021-03-06 ENCOUNTER — Other Ambulatory Visit: Payer: Self-pay | Admitting: Family Medicine

## 2021-03-22 DIAGNOSIS — H25813 Combined forms of age-related cataract, bilateral: Secondary | ICD-10-CM | POA: Diagnosis not present

## 2021-03-22 DIAGNOSIS — H401131 Primary open-angle glaucoma, bilateral, mild stage: Secondary | ICD-10-CM | POA: Diagnosis not present

## 2021-03-22 DIAGNOSIS — H52202 Unspecified astigmatism, left eye: Secondary | ICD-10-CM | POA: Diagnosis not present

## 2021-04-02 DIAGNOSIS — L218 Other seborrheic dermatitis: Secondary | ICD-10-CM | POA: Diagnosis not present

## 2021-04-02 DIAGNOSIS — D235 Other benign neoplasm of skin of trunk: Secondary | ICD-10-CM | POA: Diagnosis not present

## 2021-04-02 DIAGNOSIS — L570 Actinic keratosis: Secondary | ICD-10-CM | POA: Diagnosis not present

## 2021-04-02 DIAGNOSIS — D692 Other nonthrombocytopenic purpura: Secondary | ICD-10-CM | POA: Diagnosis not present

## 2021-04-02 DIAGNOSIS — Z85828 Personal history of other malignant neoplasm of skin: Secondary | ICD-10-CM | POA: Diagnosis not present

## 2021-04-02 DIAGNOSIS — C44319 Basal cell carcinoma of skin of other parts of face: Secondary | ICD-10-CM | POA: Diagnosis not present

## 2021-04-03 DIAGNOSIS — Z86718 Personal history of other venous thrombosis and embolism: Secondary | ICD-10-CM | POA: Diagnosis not present

## 2021-04-03 DIAGNOSIS — Z91013 Allergy to seafood: Secondary | ICD-10-CM | POA: Diagnosis not present

## 2021-04-03 DIAGNOSIS — H5053 Vertical heterophoria: Secondary | ICD-10-CM | POA: Diagnosis not present

## 2021-04-03 DIAGNOSIS — Z79899 Other long term (current) drug therapy: Secondary | ICD-10-CM | POA: Diagnosis not present

## 2021-04-03 DIAGNOSIS — H25812 Combined forms of age-related cataract, left eye: Secondary | ICD-10-CM | POA: Diagnosis not present

## 2021-04-03 DIAGNOSIS — G4089 Other seizures: Secondary | ICD-10-CM | POA: Diagnosis not present

## 2021-04-03 DIAGNOSIS — H25813 Combined forms of age-related cataract, bilateral: Secondary | ICD-10-CM | POA: Diagnosis not present

## 2021-04-03 DIAGNOSIS — H52202 Unspecified astigmatism, left eye: Secondary | ICD-10-CM | POA: Diagnosis not present

## 2021-04-03 DIAGNOSIS — Z7901 Long term (current) use of anticoagulants: Secondary | ICD-10-CM | POA: Diagnosis not present

## 2021-04-03 DIAGNOSIS — H4901 Third [oculomotor] nerve palsy, right eye: Secondary | ICD-10-CM | POA: Diagnosis not present

## 2021-04-03 DIAGNOSIS — H43812 Vitreous degeneration, left eye: Secondary | ICD-10-CM | POA: Diagnosis not present

## 2021-04-03 DIAGNOSIS — G4730 Sleep apnea, unspecified: Secondary | ICD-10-CM | POA: Diagnosis not present

## 2021-04-03 DIAGNOSIS — Z880 Allergy status to penicillin: Secondary | ICD-10-CM | POA: Diagnosis not present

## 2021-04-07 ENCOUNTER — Ambulatory Visit (INDEPENDENT_AMBULATORY_CARE_PROVIDER_SITE_OTHER): Payer: Medicare HMO

## 2021-04-07 DIAGNOSIS — Z1211 Encounter for screening for malignant neoplasm of colon: Secondary | ICD-10-CM

## 2021-04-07 DIAGNOSIS — Z0000 Encounter for general adult medical examination without abnormal findings: Secondary | ICD-10-CM

## 2021-04-07 NOTE — Progress Notes (Addendum)
Subjective:   Adam Melton is a 80 y.o. male who presents for Medicare Annual/Subsequent preventive examination.  I connected with  Adam Melton on 04/07/21 by an audio only telemedicine application and verified that I am speaking with the correct person using two identifiers.   I discussed the limitations, risks, security and privacy concerns of performing an evaluation and management service by telephone and the availability of in person appointments. I also discussed with the patient that there may be a patient responsible charge related to this service. The patient expressed understanding and verbally consented to this telephonic visit.  Location of Patient: Home Location of Provider: Office  List any persons and their role that are participating in the visit with the patient.    Review of Systems    Defer to PCP       Objective:    There were no vitals filed for this visit. There is no height or weight on file to calculate BMI.  Advanced Directives 01/01/2021 05/06/2019 03/29/2019 06/05/2018 03/23/2018 03/13/2018 02/11/2018  Does Patient Have a Medical Advance Directive? Yes Yes Yes Yes Yes Yes Yes  Type of Paramedic of [place];Living will [place];Living will Living will;Healthcare Power of [place];Living will [place];Living will [place];Living will [place];Living will  Does patient want to make changes to medical advance directive? - - - No - Patient declined - - No - Patient declined  Copy of [place] in Chart? No - copy requested No - copy requested No - copy requested No - copy requested Yes - Yes  Would patient like information on creating a medical advance directive? - - - No - Patient declined - - -    Current Medications (verified) Outpatient Encounter Medications as of 04/07/2021  Medication Sig   Ascorbic Acid  (VITAMIN C PO) Take by mouth daily.   busPIRone (BUSPAR) 5 MG tablet Take 1 tablet (5 mg total) by mouth daily.   Cholecalciferol (VITAMIN D PO) Take by mouth.   Cyanocobalamin (B-12 PO) Take by mouth daily.   DILANTIN 100 MG ER capsule TAKE TWO CAPSULES BY MOUTH EVERY MORNING AND TAKE THREE CAPSULES AT BEDTIME   latanoprost (XALATAN) 0.005 % ophthalmic solution instill one drop in EACH eye AT BEDTIME   timolol (TIMOPTIC) 0.5 % ophthalmic solution 1 drop 2 (two) times daily.   XARELTO 2.5 MG TABS tablet TAKE TWO TABLETS (5MG  TOTAL) BY MOUTH DAILY   No facility-administered encounter medications on file as of 04/07/2021.    Allergies (verified) Azithromycin, Penicillins, and Shellfish allergy   History: Past Medical History:  Diagnosis Date   Anxiety    Baker's cyst of knee 03/2016   Right   Cataract    Bilat; no surgery   Closed head injury 2006   Colon polyps 11/2008 & 2017   Diverticulosis 11/2008   DVT of lower limb, acute ([place]) 03/19/2016   (recurrent-->first one was L saph vein in 2015.  2017 Right gastroc (xarelto started).  Full thrombophilia panel by Dr. Marin Olp 09/2016 was NORMAL.  Dr. Marin Olp recommended low dose xarelto continuation in order to decrease the risk of recurrent DVT (finished 1 yr of full dose anticoag 03/2017).  Xarelto 5mg  qd indefinitely as per 03/2018 and 11/2018 hem f/u.   Glaucoma    Primary open angle glaucoma OU, mild stage [name], MD)-stable as of 09/2017 ophth f/u.   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  Last seizure 2007.   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  F/u ultrasound was NORMAL.   Tricompartmental disease of knee 2014   right knee   Vitamin B12 deficiency 04/29/2019   rpt in low normal range (before any replacment b12), IF ab NEG, methylmalonic acid level normal.   Past Surgical History:  Procedure Laterality Date   CARPAL TUNNEL RELEASE Right    CARPAL TUNNEL RELEASE     COLONOSCOPY W/ POLYPECTOMY  12/02/08; 10/12/15   Recall 3 yrs (Dr. 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   Family History  Problem Relation Age of Onset   Stroke Mother    Alcohol abuse Father    Cancer Sister        Ovarian   Heart disease Brother    Diabetes Neg Hx    Colon cancer Neg Hx    Social History   Socioeconomic History   Marital status: Married    Spouse name: Not on file   Number of children: Not on file   Years of education: Not on file   Highest education level: Not on file  Occupational History   Not on file  Tobacco Use   Smoking status: Never   Smokeless tobacco: Never  Vaping Use   Vaping Use: Never used  Substance and Sexual Activity   Alcohol use: No    Alcohol/week: 0.0 standard drinks   Drug use: No   Sexual activity: Not on file  Other Topics Concern   Not on file  Social History Narrative   Married, has one daughter and 3 grandchildren.   Relocated from [place], [place] 05/2015.   Retired Hydrologist.    No T/A/Ds.   Social Determinants of Health   Financial Resource Strain: Not on file  Food Insecurity: Not on file  Transportation Needs: Not on file  Physical Activity: Not on file  Stress: Not on file  Social Connections: Not on file    Tobacco Counseling Counseling given: Not Answered   Clinical Intake:                 Diabetic?no         Activities of Daily Living In your present state of health, do you have any difficulty performing the following activities: 04/24/2020  Hearing? Darreld Mclean  Vision? N  Difficulty concentrating or making decisions? N  Walking or climbing stairs? Y  Comment long distances  Dressing or bathing? N  Doing errands, shopping? N  Some recent data might be hidden    Patient Care Team: Tammi Sou, MD as PCP - General (Family Medicine) Druscilla Brownie, MD as Consulting Physician (Dermatology) Latanya Maudlin, MD as Consulting Physician (Orthopedic Surgery) [name], MD as Consulting Physician (Ophthalmology) [name], MD as Consulting Physician (Oncology) Irene Shipper, MD as Consulting Physician (Gastroenterology) Oliver Barre, MD as Consulting Physician (Ophthalmology)  Indicate any recent Medical Services you may have received from other than [facility] providers in the past year (date may be approximate).     Assessment:   This is a routine wellness examination for [place].  Hearing/Vision screen No results found.  Dietary issues and exercise activities discussed:     Goals Addressed   None   Depression Screen PHQ 2/9 Scores 04/24/2020 03/29/2019 01/01/2019 03/23/2018 03/14/2017 11/16/2015  PHQ - 2 Score 0 0 0 0 0 0    Fall Risk Fall Risk  04/24/2020 03/29/2019 01/01/2019 03/23/2018 03/14/2017  Falls in the past year? 0 1 0 No No  Comment - Rolled out of bed - - -  Number falls in past yr: 0 0 0 - -  Injury with Fall? 0 0 0 - -  Risk for fall due to : - Other (Comment) - - -  Follow up Falls evaluation  completed Falls prevention discussed Falls evaluation completed - -    FALL RISK PREVENTION PERTAINING TO THE HOME:  Any stairs in or around the home? No  If so, are there any without handrails? No  Home free of loose throw rugs in walkways, pet beds, electrical cords, etc? No  Adequate lighting in your home to reduce risk of falls? Yes   ASSISTIVE DEVICES UTILIZED TO PREVENT FALLS:  Life alert? No  Use of a cane, walker or w/c? No  Grab bars in the bathroom? Yes  Shower chair or bench in shower? Yes  Elevated toilet seat or a handicapped toilet? No   TIMED UP AND GO:  Was the test performed? No .  Length of time to ambulate 10 feet: n/a sec.    Cognitive Function: MMSE - Mini Mental State Exam 03/29/2019 03/23/2018 03/14/2017  Orientation to time 4 5 4   Orientation to Place 5 4 5   Registration 3 3 3   Attention/ Calculation 5 5 5   Recall 3 0 3  Language- name 2 objects 2 2 2   Language- repeat 1 1 1   Language- follow 3 step command 3 3 3   Language- read & follow direction 1 1 1   Write a sentence 1 1 1   Copy design 1 1 1   Total score 29 26 29         Immunizations Immunization History  Administered Date(s) Administered   Influenza, High Dose Seasonal PF 06/22/2015, 03/23/2018   PFIZER(Purple Top)SARS-COV-2 Vaccination 07/23/2019, 08/23/2019, 07/27/2020   Pneumococcal Conjugate-13 11/16/2015   Pneumococcal Polysaccharide-23 06/17/2017   Tdap 11/04/2020    TDAP status: Up to date  Flu Vaccine status: Due, Education has been provided regarding the importance of this vaccine. Advised may receive this vaccine at local pharmacy or Health Dept. Aware to provide a copy of the vaccination record if obtained from local pharmacy or Health Dept. Verbalized acceptance and understanding.  Pneumococcal vaccine status: Up to date  Covid-19 vaccine status: Completed vaccines  Qualifies for Shingles Vaccine? Yes  Zostavax completed No   Shingrix Completed?: No.    Education has  been provided regarding the importance of this vaccine. Patient has been advised to call insurance company to determine out of pocket expense if they have not yet received this vaccine. Advised may also receive vaccine at local pharmacy or Health Dept. Verbalized acceptance and understanding.  Screening Tests Health Maintenance  Topic Date Due   Hepatitis C Screening  Never done   Zoster Vaccines- Shingrix (1 of 2) Never done   COLONOSCOPY (Pts 45-3yrs Insurance coverage will need to be confirmed)  10/12/2018   COVID-19 Vaccine (4 - Booster for Pfizer series) 10/19/2020   INFLUENZA VACCINE  02/12/2021   TETANUS/TDAP  11/05/2030   HPV VACCINES  Aged Out    Health Maintenance  Health Maintenance Due  Topic Date Due   Hepatitis C Screening  Never done   Zoster Vaccines- Shingrix (1 of 2) Never done   COLONOSCOPY (Pts 45-17yrs Insurance coverage will need to be confirmed)  10/12/2018   COVID-19 Vaccine (4 - Booster for [place] series) 10/19/2020   INFLUENZA VACCINE  02/12/2021    Colorectal cancer screening: Referral to GI placed 04/07/21. Pt aware the office will call re: appt.  Lung Cancer Screening: (Low Dose CT Chest recommended if Age 56-80 years, 30 pack-year currently smoking OR have quit w/in 15years.) does not qualify.   Lung Cancer Screening Referral: n/a  Additional Screening:  Hepatitis C Screening: does not qualify; Completed n/a  Vision Screening: Recommended annual ophthalmology exams for early detection of glaucoma and other disorders of the eye. Is the patient up to date with their annual eye exam?  Yes  Who is the provider or what is the name of the office in which the patient attends annual eye exams? Dr. Larose Kells If pt is not established with a provider, would they like to be referred to a provider to establish care? No .   Dental Screening: Recommended annual dental exams for proper oral hygiene  Community Resource Referral / Chronic Care Management: CRR  required this visit?  No   CCM required this visit?  No      Plan:     I have personally reviewed and noted the following in the patient's chart:   Medical and social history Use of alcohol, tobacco or illicit drugs  Current medications and supplements including opioid prescriptions. Patient is not currently taking opioid prescriptions. Functional ability and status Nutritional status Physical activity Advanced directives List of other physicians Hospitalizations, surgeries, and ER visits in previous 12 months Vitals Screenings to include cognitive, depression, and falls Referrals and appointments  In addition, I have reviewed and discussed with patient certain preventive protocols, quality metrics, and best practice recommendations. A written personalized care plan for preventive services as well as general preventive health recommendations were provided to patient.     Kavin Leech, [facility]   04/07/2021   Nurse Notes: Non face to face time 23 minutes.   Mr. Iddings , Thank you for taking time to come for your Medicare Wellness Visit. I appreciate your ongoing commitment to your health goals. Please review the following plan we discussed and let me know if I can assist you in the future.   These are the goals we discussed:  Goals      Patient Stated     Maintain current health, write more.      Weight (lb) < 185 lb (83.9 kg)     Lose weight by continuing  low carb meals and walking.      Weight (lb) < 195 lb (88.5 kg)     Lose weight, low carb and walking.         This is a list of the screening recommended for you and due dates:  Health Maintenance  Topic Date Due   Hepatitis C Screening: USPSTF Recommendation to screen - Ages 41-79 yo.  Never done   Zoster (Shingles) Vaccine (1 of 2) Never done   Colon Cancer Screening  10/12/2018   COVID-19 Vaccine (4 - Booster for Pfizer series) 10/19/2020   Flu Shot  10/12/2021*   Tetanus Vaccine  11/05/2030   HPV  Vaccine  Aged Out  *Topic was postponed. The date shown is not the original due date.      I agree with the above data. Signed:  Crissie Sickles, MD           04/12/2021

## 2021-04-10 DIAGNOSIS — H25813 Combined forms of age-related cataract, bilateral: Secondary | ICD-10-CM | POA: Diagnosis not present

## 2021-04-10 DIAGNOSIS — Z79899 Other long term (current) drug therapy: Secondary | ICD-10-CM | POA: Diagnosis not present

## 2021-04-10 DIAGNOSIS — R569 Unspecified convulsions: Secondary | ICD-10-CM | POA: Diagnosis not present

## 2021-04-10 DIAGNOSIS — H401130 Primary open-angle glaucoma, bilateral, stage unspecified: Secondary | ICD-10-CM | POA: Diagnosis not present

## 2021-04-10 DIAGNOSIS — H25811 Combined forms of age-related cataract, right eye: Secondary | ICD-10-CM | POA: Diagnosis not present

## 2021-04-10 DIAGNOSIS — H43812 Vitreous degeneration, left eye: Secondary | ICD-10-CM | POA: Diagnosis not present

## 2021-04-10 DIAGNOSIS — H52202 Unspecified astigmatism, left eye: Secondary | ICD-10-CM | POA: Diagnosis not present

## 2021-04-10 DIAGNOSIS — Z86718 Personal history of other venous thrombosis and embolism: Secondary | ICD-10-CM | POA: Diagnosis not present

## 2021-04-10 DIAGNOSIS — H02431 Paralytic ptosis of right eyelid: Secondary | ICD-10-CM | POA: Diagnosis not present

## 2021-04-10 DIAGNOSIS — G4730 Sleep apnea, unspecified: Secondary | ICD-10-CM | POA: Diagnosis not present

## 2021-05-01 ENCOUNTER — Other Ambulatory Visit: Payer: Self-pay

## 2021-05-14 DIAGNOSIS — K090 Developmental odontogenic cysts: Secondary | ICD-10-CM | POA: Diagnosis not present

## 2021-05-14 DIAGNOSIS — R69 Illness, unspecified: Secondary | ICD-10-CM | POA: Diagnosis not present

## 2021-05-28 DIAGNOSIS — E538 Deficiency of other specified B group vitamins: Secondary | ICD-10-CM | POA: Diagnosis not present

## 2021-05-28 DIAGNOSIS — G40909 Epilepsy, unspecified, not intractable, without status epilepticus: Secondary | ICD-10-CM | POA: Diagnosis not present

## 2021-05-28 DIAGNOSIS — Z7689 Persons encountering health services in other specified circumstances: Secondary | ICD-10-CM | POA: Diagnosis not present

## 2021-05-28 DIAGNOSIS — I825Z1 Chronic embolism and thrombosis of unspecified deep veins of right distal lower extremity: Secondary | ICD-10-CM | POA: Diagnosis not present

## 2021-05-28 DIAGNOSIS — E7800 Pure hypercholesterolemia, unspecified: Secondary | ICD-10-CM | POA: Diagnosis not present

## 2021-05-28 DIAGNOSIS — Z8546 Personal history of malignant neoplasm of prostate: Secondary | ICD-10-CM | POA: Diagnosis not present

## 2021-05-31 DIAGNOSIS — E7800 Pure hypercholesterolemia, unspecified: Secondary | ICD-10-CM | POA: Insufficient documentation

## 2021-05-31 DIAGNOSIS — E538 Deficiency of other specified B group vitamins: Secondary | ICD-10-CM | POA: Insufficient documentation

## 2021-06-23 ENCOUNTER — Other Ambulatory Visit: Payer: Self-pay | Admitting: Family Medicine

## 2021-06-25 ENCOUNTER — Inpatient Hospital Stay: Payer: Medicare HMO | Admitting: Hematology & Oncology

## 2021-06-25 ENCOUNTER — Other Ambulatory Visit: Payer: Self-pay

## 2021-06-25 ENCOUNTER — Inpatient Hospital Stay: Payer: Medicare HMO | Attending: Hematology & Oncology

## 2021-06-25 ENCOUNTER — Encounter: Payer: Self-pay | Admitting: Hematology & Oncology

## 2021-06-25 VITALS — BP 163/90 | HR 60 | Temp 98.1°F | Resp 18 | Wt 218.4 lb

## 2021-06-25 DIAGNOSIS — I825Z1 Chronic embolism and thrombosis of unspecified deep veins of right distal lower extremity: Secondary | ICD-10-CM

## 2021-06-25 DIAGNOSIS — I82561 Chronic embolism and thrombosis of right calf muscular vein: Secondary | ICD-10-CM | POA: Diagnosis present

## 2021-06-25 DIAGNOSIS — Z7901 Long term (current) use of anticoagulants: Secondary | ICD-10-CM | POA: Insufficient documentation

## 2021-06-25 LAB — CMP (CANCER CENTER ONLY)
ALT: 10 U/L (ref 0–44)
AST: 15 U/L (ref 15–41)
Albumin: 4.2 g/dL (ref 3.5–5.0)
Alkaline Phosphatase: 131 U/L — ABNORMAL HIGH (ref 38–126)
Anion gap: 7 (ref 5–15)
BUN: 16 mg/dL (ref 8–23)
CO2: 30 mmol/L (ref 22–32)
Calcium: 9.3 mg/dL (ref 8.9–10.3)
Chloride: 103 mmol/L (ref 98–111)
Creatinine: 0.97 mg/dL (ref 0.61–1.24)
GFR, Estimated: >60 mL/min (ref >60)
Glucose, Bld: 92 mg/dL (ref 70–99)
Potassium: 4.8 mmol/L (ref 3.5–5.1)
Sodium: 140 mmol/L (ref 135–145)
Total Bilirubin: 0.4 mg/dL (ref 0.3–1.2)
Total Protein: 6.5 g/dL (ref 6.5–8.1)

## 2021-06-25 LAB — CBC WITH DIFFERENTIAL (CANCER CENTER ONLY)
Abs Immature Granulocytes: 0.05 10*3/uL (ref 0.00–0.07)
Basophils Absolute: 0 10*3/uL (ref 0.0–0.1)
Basophils Relative: 1 %
Eosinophils Absolute: 0.2 10*3/uL (ref 0.0–0.5)
Eosinophils Relative: 4 %
HCT: 44.7 % (ref 39.0–52.0)
Hemoglobin: 15.3 g/dL (ref 13.0–17.0)
Immature Granulocytes: 1 %
Lymphocytes Relative: 20 %
Lymphs Abs: 1.1 10*3/uL (ref 0.7–4.0)
MCH: 34.5 pg — ABNORMAL HIGH (ref 26.0–34.0)
MCHC: 34.2 g/dL (ref 30.0–36.0)
MCV: 100.7 fL — ABNORMAL HIGH (ref 80.0–100.0)
Monocytes Absolute: 0.5 10*3/uL (ref 0.1–1.0)
Monocytes Relative: 9 %
Neutro Abs: 3.6 10*3/uL (ref 1.7–7.7)
Neutrophils Relative %: 65 %
Platelet Count: 203 10*3/uL (ref 150–400)
RBC: 4.44 MIL/uL (ref 4.22–5.81)
RDW: 13 % (ref 11.5–15.5)
WBC Count: 5.5 10*3/uL (ref 4.0–10.5)
nRBC: 0 % (ref 0.0–0.2)

## 2021-06-25 NOTE — Progress Notes (Signed)
Hematology and Oncology Follow Up Visit  Adam Melton [id number] July 18, 1940 80 y.o. 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  I saw him 6 months ago.  He is doing pretty well.  He has had no problems with the Xarelto.  He had a good Thanksgiving.  He is eating well.  He has had no problems with bowels or bladder.  He has had no issues with rashes.  Has had no swollen lymph nodes.  Thankfully, he is avoided COVID.  He comes in with his wife.  He has had a very nice Thanksgiving.  I am sure that they will have a very nice Christmas with her family.  I forgot to mention that apparently his family doctor wants him to be seen because of some elevated liver function studies.  His LFTs were okay for me.  His alkaline phosphatase was up a little bit which I am sure is from Dilantin.    Overall, his performance status is ECOG 1.   Medications:  Current Outpatient Medications:    Ascorbic Acid (VITAMIN C PO), Take by mouth daily., Disp: , Rfl:    Cholecalciferol (VITAMIN D PO), Take by mouth., Disp: , Rfl:    Cyanocobalamin (B-12 PO), Take by mouth daily., Disp: , Rfl:    DILANTIN 100 MG ER capsule, TAKE TWO CAPSULES BY MOUTH EVERY MORNING AND TAKE THREE CAPSULES AT BEDTIME, Disp: 450 capsule, Rfl: 0   latanoprost (XALATAN) 0.005 % ophthalmic solution, instill one drop in EACH eye AT BEDTIME, Disp: , Rfl: 5   XARELTO 2.5 MG TABS tablet, TAKE TWO TABLETS (5MG  TOTAL) BY MOUTH DAILY, Disp: 60 tablet, Rfl: 5   busPIRone (BUSPAR) 5 MG tablet, Take 1 tablet (5 mg total) by mouth daily., Disp: 30 tablet, Rfl: 0   timolol (TIMOPTIC) 0.5 % ophthalmic solution, 1 drop 2 (two) times daily. (Patient not taking: Reported on 06/25/2021), Disp: , Rfl:   Allergies:  Allergies  Allergen Reactions   Azithromycin Other (See Comments)     seizure   Penicillins Nausea And Vomiting and Rash   Shellfish Allergy Swelling    Past Medical History, Surgical history, Social history, and Family History were reviewed and updated.  Review of Systems: Review of Systems  Constitutional: Negative.   HENT: Negative.    Eyes: Negative.   Respiratory: Negative.    Cardiovascular: Negative.   Gastrointestinal: Negative.   Genitourinary: Negative.   Musculoskeletal: Negative.   Skin: Negative.   Neurological: Negative.   Endo/Heme/Allergies: Negative.   Psychiatric/Behavioral: Negative.      Physical Exam:  weight is 218 lb 6.4 oz (99.1 kg). His oral temperature is 98.1 F (36.7 C). His blood pressure is 163/90 (abnormal) and his pulse is 60. His respiration is 18 and oxygen saturation is 98%.   Wt Readings from Last 3 Encounters:  06/25/21 218 lb 6.4 oz (99.1 kg)  01/01/21 219 lb (99.3 kg)  11/13/20 214 lb 3.2 oz (97.2 kg)     Physical Exam Vitals reviewed.  HENT:     Head: Normocephalic and atraumatic.  Eyes:     Pupils: Pupils are equal, round, and reactive to light.  Cardiovascular:     Rate and Rhythm: Normal rate and regular rhythm.     Heart sounds: Normal heart sounds.  Pulmonary:  Effort: Pulmonary effort is normal.     Breath sounds: Normal breath sounds.  Abdominal:     General: Bowel sounds are normal.     Palpations: Abdomen is soft.  Musculoskeletal:        General: No tenderness or deformity. Normal range of motion.     Cervical back: Normal range of motion.  Lymphadenopathy:     Cervical: No cervical adenopathy.  Skin:    General: Skin is warm and dry.     Findings: No erythema or rash.  Neurological:     Mental Status: He is alert and oriented to person, place, and time.  Psychiatric:        Behavior: Behavior normal.        Thought Content: Thought content normal.        Judgment: Judgment normal.  Is Lab Results  Component Value Date   WBC 5.5 06/25/2021   HGB 15.3 06/25/2021    HCT 44.7 06/25/2021   MCV 100.7 (H) 06/25/2021   PLT 203 06/25/2021     Chemistry      Component Value Date/Time   NA 140 06/25/2021 1112   NA 144 01/03/2017 1114   K 4.8 06/25/2021 1112   K 4.5 01/03/2017 1114   CL 103 06/25/2021 1112   CL 108 01/03/2017 1114   CO2 30 06/25/2021 1112   CO2 32 01/03/2017 1114   BUN 16 06/25/2021 1112   BUN 13 01/03/2017 1114   CREATININE 0.97 06/25/2021 1112   CREATININE 1.1 01/03/2017 1114      Component Value Date/Time   CALCIUM 9.3 06/25/2021 1112   CALCIUM 9.1 01/03/2017 1114   ALKPHOS 131 (H) 06/25/2021 1112   ALKPHOS 125 (H) 01/03/2017 1114   AST 15 06/25/2021 1112   ALT 10 06/25/2021 1112   ALT 22 01/03/2017 1114   BILITOT 0.4 06/25/2021 1112      Impression and Plan:  Adam Melton is a 80 year old white male. He developed a thrombus in the right lower leg. He has had prior thromboembolic disease. He was taken off blood thinner with Coumadin and then developed another thrombus.  Again, he is on lifelong anticoagulation from my point of view.  I think 5 mg is appropriate for him.  I just do not think that this should not cause any problems and that it should be effective.  Again, from my point of view, I do not see any issues with his liver function studies.  I will plan to see him back in 6 more months.    [name], MD 12/12/202212:08 PM

## 2021-08-03 ENCOUNTER — Other Ambulatory Visit: Payer: Self-pay | Admitting: Family Medicine

## 2021-08-06 ENCOUNTER — Other Ambulatory Visit: Payer: Self-pay | Admitting: Family Medicine

## 2021-09-06 ENCOUNTER — Other Ambulatory Visit: Payer: Self-pay | Admitting: Hematology & Oncology

## 2021-12-24 ENCOUNTER — Encounter: Payer: Self-pay | Admitting: Hematology & Oncology

## 2021-12-24 ENCOUNTER — Other Ambulatory Visit: Payer: Self-pay | Admitting: Lab

## 2021-12-24 ENCOUNTER — Inpatient Hospital Stay: Payer: Medicare HMO | Attending: Hematology & Oncology

## 2021-12-24 ENCOUNTER — Inpatient Hospital Stay: Payer: Medicare HMO | Admitting: Hematology & Oncology

## 2021-12-24 VITALS — BP 129/94 | HR 63 | Temp 97.8°F | Resp 20 | Ht 72.0 in | Wt 210.0 lb

## 2021-12-24 DIAGNOSIS — I825Z1 Chronic embolism and thrombosis of unspecified deep veins of right distal lower extremity: Secondary | ICD-10-CM | POA: Diagnosis not present

## 2021-12-24 DIAGNOSIS — Z7901 Long term (current) use of anticoagulants: Secondary | ICD-10-CM | POA: Diagnosis not present

## 2021-12-24 DIAGNOSIS — I82561 Chronic embolism and thrombosis of right calf muscular vein: Secondary | ICD-10-CM | POA: Insufficient documentation

## 2021-12-24 LAB — CMP (CANCER CENTER ONLY)
ALT: 10 U/L (ref 0–44)
AST: 16 U/L (ref 15–41)
Albumin: 4.3 g/dL (ref 3.5–5.0)
Alkaline Phosphatase: 123 U/L (ref 38–126)
Anion gap: 9 (ref 5–15)
BUN: 16 mg/dL (ref 8–23)
CO2: 25 mmol/L (ref 22–32)
Calcium: 9 mg/dL (ref 8.9–10.3)
Chloride: 105 mmol/L (ref 98–111)
Creatinine: 1.01 mg/dL (ref 0.61–1.24)
GFR, Estimated: >60 mL/min (ref >60)
Glucose, Bld: 71 mg/dL (ref 70–99)
Potassium: 3.8 mmol/L (ref 3.5–5.1)
Sodium: 139 mmol/L (ref 135–145)
Total Bilirubin: 0.5 mg/dL (ref 0.3–1.2)
Total Protein: 6.4 g/dL — ABNORMAL LOW (ref 6.5–8.1)

## 2021-12-24 LAB — CBC WITH DIFFERENTIAL (CANCER CENTER ONLY)
Abs Immature Granulocytes: 0.03 10*3/uL (ref 0.00–0.07)
Basophils Absolute: 0 10*3/uL (ref 0.0–0.1)
Basophils Relative: 1 %
Eosinophils Absolute: 0.3 10*3/uL (ref 0.0–0.5)
Eosinophils Relative: 6 %
HCT: 42.8 % (ref 39.0–52.0)
Hemoglobin: 14.9 g/dL (ref 13.0–17.0)
Immature Granulocytes: 1 %
Lymphocytes Relative: 18 %
Lymphs Abs: 1 10*3/uL (ref 0.7–4.0)
MCH: 34.7 pg — ABNORMAL HIGH (ref 26.0–34.0)
MCHC: 34.8 g/dL (ref 30.0–36.0)
MCV: 99.5 fL (ref 80.0–100.0)
Monocytes Absolute: 0.8 10*3/uL (ref 0.1–1.0)
Monocytes Relative: 15 %
Neutro Abs: 3.2 10*3/uL (ref 1.7–7.7)
Neutrophils Relative %: 59 %
Platelet Count: 188 10*3/uL (ref 150–400)
RBC: 4.3 MIL/uL (ref 4.22–5.81)
RDW: 13.1 % (ref 11.5–15.5)
WBC Count: 5.3 10*3/uL (ref 4.0–10.5)
nRBC: 0 % (ref 0.0–0.2)

## 2021-12-24 LAB — LACTATE DEHYDROGENASE: LDH: 209 U/L — ABNORMAL HIGH (ref 98–192)

## 2021-12-24 NOTE — Progress Notes (Signed)
Hematology and Oncology Follow Up Visit  Adam Melton [id number] 11-25-1940 81 y.o. 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 is back for follow-up.  Overall, he is doing pretty well.  We last saw him 6 months ago.  Since then, he really has had no specific complaints.  He says that there might be a little bit of fullness in the left leg.  He never had a problem with the left leg.  There is been a little bit of swelling.  Some varicose veins in the left leg.  He has had no problems with the Xarelto.  He is on 5 mg.  He is doing well with the Xarelto.  He has had no problems with nausea or vomiting.  He has had no change in bowel or bladder habits.  He has had no cough.  Thankfully, he has had no issues with COVID.  Overall, I would say that his performance status right now is ECOG 1.   Medications:  Current Outpatient Medications:    Ascorbic Acid (VITAMIN C PO), Take by mouth daily., Disp: , Rfl:    busPIRone (BUSPAR) 5 MG tablet, Take 1 tablet (5 mg total) by mouth daily. (Patient taking differently: Take 5 mg by mouth 2 (two) times daily.), Disp: 30 tablet, Rfl: 0   Cholecalciferol (VITAMIN D PO), Take 1,000 Int'l Units/day by mouth daily., Disp: , Rfl:    Cyanocobalamin (B-12 PO), Take by mouth daily., Disp: , Rfl:    DILANTIN 100 MG ER capsule, TAKE TWO CAPSULES BY MOUTH EVERY MORNING AND TAKE THREE CAPSULES AT BEDTIME (Patient taking differently: 200 mg 2 (two) times daily. 12/24/2021 Takes 2 capsules BID.), Disp: 450 capsule, Rfl: 0   latanoprost (XALATAN) 0.005 % ophthalmic solution, instill one drop in EACH eye AT BEDTIME, Disp: , Rfl: 5   XARELTO 2.5 MG TABS tablet, TAKE TWO TABLETS ('5MG'$  TOTAL) BY MOUTH DAILY (Patient taking differently: 2.5 mg 2 (two) times daily.), Disp: 60 tablet, Rfl: 5  Allergies:  Allergies  Allergen  Reactions   Azithromycin Other (See Comments)    seizure   Penicillins Nausea And Vomiting and Rash   Shellfish Allergy Swelling    Past Medical History, Surgical history, Social history, and Family History were reviewed and updated.  Review of Systems: Review of Systems  Constitutional: Negative.   HENT: Negative.    Eyes: Negative.   Respiratory: Negative.    Cardiovascular: Negative.   Gastrointestinal: Negative.   Genitourinary: Negative.   Musculoskeletal: Negative.   Skin: Negative.   Neurological: Negative.   Endo/Heme/Allergies: Negative.   Psychiatric/Behavioral: Negative.       Physical Exam:  height is 6' (1.829 m) and weight is 210 lb (95.3 kg). His oral temperature is 97.8 F (36.6 C). His blood pressure is 129/94 (abnormal) and his pulse is 63. His respiration is 20 and oxygen saturation is 94%.   Wt Readings from Last 3 Encounters:  12/24/21 210 lb (95.3 kg)  06/25/21 218 lb 6.4 oz (99.1 kg)  01/01/21 219 lb (99.3 kg)     Physical Exam Vitals reviewed.  HENT:     Head: Normocephalic and atraumatic.  Eyes:     Pupils: Pupils are equal, round, and reactive to light.  Cardiovascular:     Rate and Rhythm: Normal rate and regular rhythm.     Heart  sounds: Normal heart sounds.  Pulmonary:     Effort: Pulmonary effort is normal.     Breath sounds: Normal breath sounds.  Abdominal:     General: Bowel sounds are normal.     Palpations: Abdomen is soft.  Musculoskeletal:        General: No tenderness or deformity. Normal range of motion.     Cervical back: Normal range of motion.  Lymphadenopathy:     Cervical: No cervical adenopathy.  Skin:    General: Skin is warm and dry.     Findings: No erythema or rash.  Neurological:     Mental Status: He is alert and oriented to person, place, and time.  Psychiatric:        Behavior: Behavior normal.        Thought Content: Thought content normal.        Judgment: Judgment normal.  Is Lab Results   Component Value Date   WBC 5.3 12/24/2021   HGB 14.9 12/24/2021   HCT 42.8 12/24/2021   MCV 99.5 12/24/2021   PLT 188 12/24/2021     Chemistry      Component Value Date/Time   NA 139 12/24/2021 1134   NA 144 01/03/2017 1114   K 3.8 12/24/2021 1134   K 4.5 01/03/2017 1114   CL 105 12/24/2021 1134   CL 108 01/03/2017 1114   CO2 25 12/24/2021 1134   CO2 32 01/03/2017 1114   BUN 16 12/24/2021 1134   BUN 13 01/03/2017 1114   CREATININE 1.01 12/24/2021 1134   CREATININE 1.1 01/03/2017 1114      Component Value Date/Time   CALCIUM 9.0 12/24/2021 1134   CALCIUM 9.1 01/03/2017 1114   ALKPHOS 123 12/24/2021 1134   ALKPHOS 125 (H) 01/03/2017 1114   AST 16 12/24/2021 1134   ALT 10 12/24/2021 1134   ALT 22 01/03/2017 1114   BILITOT 0.5 12/24/2021 1134      Impression and Plan:  [name] is a 81 year old white male. He developed a thrombus in the right lower leg. He has had prior thromboembolic disease. He was taken off blood thinner with Coumadin and then developed another thrombus.  Again, he is on lifelong anticoagulation from my point of view.  I think 5 mg is appropriate for him.  I just do not think that this should not cause any problems and that it should be effective.  We will plan to get him back in 6 more months.  I do not see that we have to do any scans on him or Dopplers.  [name], MD 6/12/202312:34 PM

## 2022-02-18 ENCOUNTER — Other Ambulatory Visit: Payer: Self-pay | Admitting: Hematology & Oncology

## 2022-04-02 ENCOUNTER — Telehealth: Payer: Self-pay | Admitting: Family Medicine

## 2022-04-02 NOTE — Telephone Encounter (Signed)
Left message for patient to schedule Annual Wellness Visit.  Please schedule (telephone/video call) with Nurse Health Advisor Tina Betterson, RN at [facility]. Please call [phone] ask for Kathy 

## 2022-04-15 ENCOUNTER — Telehealth: Payer: Self-pay | Admitting: Family Medicine

## 2022-04-15 NOTE — Telephone Encounter (Signed)
Spouse stated patient sees another provider

## 2022-06-24 ENCOUNTER — Inpatient Hospital Stay: Payer: Medicare HMO | Admitting: Hematology & Oncology

## 2022-06-24 ENCOUNTER — Inpatient Hospital Stay: Payer: Medicare HMO | Attending: Hematology & Oncology

## 2022-06-24 ENCOUNTER — Other Ambulatory Visit: Payer: Self-pay

## 2022-06-24 ENCOUNTER — Encounter: Payer: Self-pay | Admitting: Hematology & Oncology

## 2022-06-24 VITALS — BP 124/90 | HR 85 | Temp 97.9°F | Resp 18 | Ht 72.0 in | Wt 208.8 lb

## 2022-06-24 DIAGNOSIS — Z7901 Long term (current) use of anticoagulants: Secondary | ICD-10-CM | POA: Diagnosis not present

## 2022-06-24 DIAGNOSIS — I825Z1 Chronic embolism and thrombosis of unspecified deep veins of right distal lower extremity: Secondary | ICD-10-CM

## 2022-06-24 DIAGNOSIS — Z79899 Other long term (current) drug therapy: Secondary | ICD-10-CM | POA: Insufficient documentation

## 2022-06-24 DIAGNOSIS — Z86718 Personal history of other venous thrombosis and embolism: Secondary | ICD-10-CM | POA: Diagnosis present

## 2022-06-24 LAB — CMP (CANCER CENTER ONLY)
ALT: 10 U/L (ref 0–44)
AST: 12 U/L — ABNORMAL LOW (ref 15–41)
Albumin: 4.3 g/dL (ref 3.5–5.0)
Alkaline Phosphatase: 114 U/L (ref 38–126)
Anion gap: 7 (ref 5–15)
BUN: 21 mg/dL (ref 8–23)
CO2: 28 mmol/L (ref 22–32)
Calcium: 8.8 mg/dL — ABNORMAL LOW (ref 8.9–10.3)
Chloride: 105 mmol/L (ref 98–111)
Creatinine: 0.97 mg/dL (ref 0.61–1.24)
GFR, Estimated: >60 mL/min (ref >60)
Glucose, Bld: 152 mg/dL — ABNORMAL HIGH (ref 70–99)
Potassium: 4.3 mmol/L (ref 3.5–5.1)
Sodium: 140 mmol/L (ref 135–145)
Total Bilirubin: 0.5 mg/dL (ref 0.3–1.2)
Total Protein: 6.6 g/dL (ref 6.5–8.1)

## 2022-06-24 LAB — CBC WITH DIFFERENTIAL (CANCER CENTER ONLY)
Abs Immature Granulocytes: 0.04 10*3/uL (ref 0.00–0.07)
Basophils Absolute: 0 10*3/uL (ref 0.0–0.1)
Basophils Relative: 1 %
Eosinophils Absolute: 0.2 10*3/uL (ref 0.0–0.5)
Eosinophils Relative: 4 %
HCT: 43.6 % (ref 39.0–52.0)
Hemoglobin: 14.5 g/dL (ref 13.0–17.0)
Immature Granulocytes: 1 %
Lymphocytes Relative: 19 %
Lymphs Abs: 1 10*3/uL (ref 0.7–4.0)
MCH: 34 pg (ref 26.0–34.0)
MCHC: 33.3 g/dL (ref 30.0–36.0)
MCV: 102.1 fL — ABNORMAL HIGH (ref 80.0–100.0)
Monocytes Absolute: 0.5 10*3/uL (ref 0.1–1.0)
Monocytes Relative: 10 %
Neutro Abs: 3.5 10*3/uL (ref 1.7–7.7)
Neutrophils Relative %: 65 %
Platelet Count: 279 10*3/uL (ref 150–400)
RBC: 4.27 MIL/uL (ref 4.22–5.81)
RDW: 13.2 % (ref 11.5–15.5)
WBC Count: 5.2 10*3/uL (ref 4.0–10.5)
nRBC: 0 % (ref 0.0–0.2)

## 2022-06-24 NOTE — Progress Notes (Signed)
Hematology and Oncology Follow Up Visit  Adam Melton [id number] [birth year] 81 y.o. 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  We saw him 6 months ago.  Since then, has been doing pretty well.  He really has not traveled anywhere.  I think his daughter and some grandchildren went to [place] and  [place]  It sounds like they had a wonderful time.  He is doing well on the Xarelto.  He has had no problems with leg pain.  He has had no leg swelling.  He has had no bleeding.  There is been no cough or shortness of breath.  He has had no chest wall pain.  There is been no nausea or vomiting.  He is avoided COVID.  He and his whole family were at their daughter's house yesterday.  There is a somewhat of a pre-Christmas dinner.  He ate quite a lot.  Overall, I would have to say that his performance status is ECOG 1.   Medications:  Current Outpatient Medications:    Ascorbic Acid (VITAMIN C PO), Take by mouth daily., Disp: , Rfl:    busPIRone (BUSPAR) 5 MG tablet, Take 1 tablet (5 mg total) by mouth daily. (Patient taking differently: Take 5 mg by mouth 2 (two) times daily.), Disp: 30 tablet, Rfl: 0   Cholecalciferol (VITAMIN D PO), Take 1,000 Int'l Units/day by mouth daily., Disp: , Rfl:    Cyanocobalamin (B-12 PO), Take by mouth daily., Disp: , Rfl:    DILANTIN 100 MG ER capsule, TAKE TWO CAPSULES BY MOUTH EVERY MORNING AND TAKE THREE CAPSULES AT BEDTIME (Patient taking differently: 200 mg 2 (two) times daily. 12/24/2021 Takes 2 capsules BID.), Disp: 450 capsule, Rfl: 0   latanoprost (XALATAN) 0.005 % ophthalmic solution, instill one drop in EACH eye AT BEDTIME, Disp: , Rfl: 5   XARELTO 2.5 MG TABS tablet, TAKE TWO TABLETS ('5MG'$  TOTAL) BY MOUTH DAILY, Disp: 60 tablet, Rfl: 5  Allergies:  Allergies  Allergen Reactions    Azithromycin Other (See Comments)    seizure   Penicillins Nausea And Vomiting and Rash   Shellfish Allergy Swelling    Past Medical History, Surgical history, Social history, and Family History were reviewed and updated.  Review of Systems: Review of Systems  Constitutional: Negative.   HENT: Negative.    Eyes: Negative.   Respiratory: Negative.    Cardiovascular: Negative.   Gastrointestinal: Negative.   Genitourinary: Negative.   Musculoskeletal: Negative.   Skin: Negative.   Neurological: Negative.   Endo/Heme/Allergies: Negative.   Psychiatric/Behavioral: Negative.       Physical Exam:  height is 6' (1.829 m) and weight is 208 lb 12.8 oz (94.7 kg). His oral temperature is 97.9 F (36.6 C). His blood pressure is 124/90 (abnormal) and his pulse is 85. His respiration is 18 and oxygen saturation is 96%.   Wt Readings from Last 3 Encounters:  06/24/22 208 lb 12.8 oz (94.7 kg)  12/24/21 210 lb (95.3 kg)  06/25/21 218 lb 6.4 oz (99.1 kg)     Physical Exam Vitals reviewed.  HENT:     Head: Normocephalic and atraumatic.  Eyes:     Pupils: Pupils are equal, round, and reactive to light.  Cardiovascular:     Rate and Rhythm: Normal rate and regular rhythm.  Heart sounds: Normal heart sounds.  Pulmonary:     Effort: Pulmonary effort is normal.     Breath sounds: Normal breath sounds.  Abdominal:     General: Bowel sounds are normal.     Palpations: Abdomen is soft.  Musculoskeletal:        General: No tenderness or deformity. Normal range of motion.     Cervical back: Normal range of motion.  Lymphadenopathy:     Cervical: No cervical adenopathy.  Skin:    General: Skin is warm and dry.     Findings: No erythema or rash.  Neurological:     Mental Status: He is alert and oriented to person, place, and time.  Psychiatric:        Behavior: Behavior normal.        Thought Content: Thought content normal.        Judgment: Judgment normal.   Is Lab Results   Component Value Date   WBC 5.2 06/24/2022   HGB 14.5 06/24/2022   HCT 43.6 06/24/2022   MCV 102.1 (H) 06/24/2022   PLT 279 06/24/2022     Chemistry      Component Value Date/Time   NA 140 06/24/2022 1126   NA 144 01/03/2017 1114   K 4.3 06/24/2022 1126   K 4.5 01/03/2017 1114   CL 105 06/24/2022 1126   CL 108 01/03/2017 1114   CO2 28 06/24/2022 1126   CO2 32 01/03/2017 1114   BUN 21 06/24/2022 1126   BUN 13 01/03/2017 1114   CREATININE 0.97 06/24/2022 1126   CREATININE 1.1 01/03/2017 1114      Component Value Date/Time   CALCIUM 8.8 (L) 06/24/2022 1126   CALCIUM 9.1 01/03/2017 1114   ALKPHOS 114 06/24/2022 1126   ALKPHOS 125 (H) 01/03/2017 1114   AST 12 (L) 06/24/2022 1126   ALT 10 06/24/2022 1126   ALT 22 01/03/2017 1114   BILITOT 0.5 06/24/2022 1126      Impression and Plan:  Adam Melton is a 81 year old white male. He developed a thrombus in the right lower leg. He has had prior thromboembolic disease. He was taken off blood thinner with Coumadin and then developed another thrombus.  Again, he is on lifelong anticoagulation from my point of view.  I think 5 mg is appropriate for him.  I just do not think that this should not cause any problems and that it should be effective.  We will plan to get him back in 6 more months.  I do not see that we have to do any scans on him or Dopplers.  [name], MD 12/11/202312:33 PM

## 2022-08-07 ENCOUNTER — Other Ambulatory Visit: Payer: Self-pay | Admitting: Hematology & Oncology

## 2022-12-24 ENCOUNTER — Other Ambulatory Visit: Payer: Self-pay

## 2022-12-24 ENCOUNTER — Inpatient Hospital Stay: Payer: Medicare HMO | Attending: Hematology & Oncology

## 2022-12-24 ENCOUNTER — Inpatient Hospital Stay: Payer: Medicare HMO | Admitting: Hematology & Oncology

## 2022-12-24 ENCOUNTER — Encounter: Payer: Self-pay | Admitting: Hematology & Oncology

## 2022-12-24 VITALS — BP 131/92 | HR 54 | Ht 72.0 in | Wt 203.0 lb

## 2022-12-24 DIAGNOSIS — H02409 Unspecified ptosis of unspecified eyelid: Secondary | ICD-10-CM | POA: Diagnosis not present

## 2022-12-24 DIAGNOSIS — Z7901 Long term (current) use of anticoagulants: Secondary | ICD-10-CM | POA: Insufficient documentation

## 2022-12-24 DIAGNOSIS — I825Z1 Chronic embolism and thrombosis of unspecified deep veins of right distal lower extremity: Secondary | ICD-10-CM | POA: Diagnosis not present

## 2022-12-24 DIAGNOSIS — I447 Left bundle-branch block, unspecified: Secondary | ICD-10-CM | POA: Diagnosis not present

## 2022-12-24 DIAGNOSIS — I82461 Acute embolism and thrombosis of right calf muscular vein: Secondary | ICD-10-CM | POA: Insufficient documentation

## 2022-12-24 LAB — CMP (CANCER CENTER ONLY)
ALT: 8 U/L (ref 0–44)
AST: 13 U/L — ABNORMAL LOW (ref 15–41)
Albumin: 4.3 g/dL (ref 3.5–5.0)
Alkaline Phosphatase: 124 U/L (ref 38–126)
Anion gap: 9 (ref 5–15)
BUN: 18 mg/dL (ref 8–23)
CO2: 25 mmol/L (ref 22–32)
Calcium: 9.3 mg/dL (ref 8.9–10.3)
Chloride: 104 mmol/L (ref 98–111)
Creatinine: 0.98 mg/dL (ref 0.61–1.24)
GFR, Estimated: >60 mL/min (ref >60)
Glucose, Bld: 105 mg/dL — ABNORMAL HIGH (ref 70–99)
Potassium: 4.7 mmol/L (ref 3.5–5.1)
Sodium: 138 mmol/L (ref 135–145)
Total Bilirubin: 0.7 mg/dL (ref 0.3–1.2)
Total Protein: 6.6 g/dL (ref 6.5–8.1)

## 2022-12-24 LAB — CBC WITH DIFFERENTIAL (CANCER CENTER ONLY)
Abs Immature Granulocytes: 0.03 10*3/uL (ref 0.00–0.07)
Basophils Absolute: 0 10*3/uL (ref 0.0–0.1)
Basophils Relative: 1 %
Eosinophils Absolute: 0.1 10*3/uL (ref 0.0–0.5)
Eosinophils Relative: 3 %
HCT: 43.6 % (ref 39.0–52.0)
Hemoglobin: 14.7 g/dL (ref 13.0–17.0)
Immature Granulocytes: 1 %
Lymphocytes Relative: 17 %
Lymphs Abs: 1 10*3/uL (ref 0.7–4.0)
MCH: 34.2 pg — ABNORMAL HIGH (ref 26.0–34.0)
MCHC: 33.7 g/dL (ref 30.0–36.0)
MCV: 101.4 fL — ABNORMAL HIGH (ref 80.0–100.0)
Monocytes Absolute: 0.6 10*3/uL (ref 0.1–1.0)
Monocytes Relative: 10 %
Neutro Abs: 3.9 10*3/uL (ref 1.7–7.7)
Neutrophils Relative %: 68 %
Platelet Count: 213 10*3/uL (ref 150–400)
RBC: 4.3 MIL/uL (ref 4.22–5.81)
RDW: 13.5 % (ref 11.5–15.5)
WBC Count: 5.6 10*3/uL (ref 4.0–10.5)
nRBC: 0 % (ref 0.0–0.2)

## 2022-12-24 NOTE — Progress Notes (Signed)
Hematology and Oncology Follow Up Visit  Adam Melton [id number] November 07, 1940 82 y.o. 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  He is doing okay headache.  He is having some eye trouble.  It looks like his right eye is having some difficulties with some ptosis.  I suppose this may have been from some injury that he had in the past.  He is going to see an ophthalmologist to try to help with this.  When I was listening to his heart today, I decided irregular.  We did get an EKG on him.  Thankfully, EKG did not show any obvious atrial fibrillation.  However he did have a wide QRS rhythm.  Had a left bundle branch block.  Not sure exactly what this means.  Will have to talk to one of our cardiologist, he will be here tomorrow.  There is been no chest pain.  He has had no cough or shortness of breath.  He is on Eliquis 5 mg this is lifelong.  He has had no bleeding.  Is been no change in bowel or bladder habits.  He has had no leg swelling.  He has had no fever.  He has had no problems with COVID.  Overall, I would say that his performance status is probably ECOG 1.     Medications:  Current Outpatient Medications:    Ascorbic Acid (VITAMIN C PO), Take by mouth daily., Disp: , Rfl:    busPIRone (BUSPAR) 5 MG tablet, Take 1 tablet (5 mg total) by mouth daily. (Patient taking differently: Take 5 mg by mouth 2 (two) times daily.), Disp: 30 tablet, Rfl: 0   Cholecalciferol (VITAMIN D PO), Take 1,000 Int'l Units/day by mouth daily., Disp: , Rfl:    Cyanocobalamin (B-12 PO), Take by mouth daily., Disp: , Rfl:    DILANTIN 100 MG ER capsule, TAKE TWO CAPSULES BY MOUTH EVERY MORNING AND TAKE THREE CAPSULES AT BEDTIME (Patient taking differently: 200 mg 2 (two) times daily. 12/24/2021 Takes 2 capsules BID.), Disp: 450 capsule, Rfl:  0   latanoprost (XALATAN) 0.005 % ophthalmic solution, instill one drop in EACH eye AT BEDTIME, Disp: , Rfl: 5   XARELTO 2.5 MG TABS tablet, TAKE TWO TABLETS (5MG  TOTAL) BY MOUTH DAILY, Disp: 60 tablet, Rfl: 5  Allergies:  Allergies  Allergen Reactions   Azithromycin Other (See Comments)    seizure   Penicillins Nausea And Vomiting and Rash   Shellfish Allergy Swelling    Past Medical History, Surgical history, Social history, and Family History were reviewed and updated.  Review of Systems: Review of Systems  Constitutional: Negative.   HENT: Negative.    Eyes: Negative.   Respiratory: Negative.    Cardiovascular: Negative.   Gastrointestinal: Negative.   Genitourinary: Negative.   Musculoskeletal: Negative.   Skin: Negative.   Neurological: Negative.   Endo/Heme/Allergies: Negative.   Psychiatric/Behavioral: Negative.       Physical Exam:  height is 6' (1.829 m) and weight is 203 lb (92.1 kg). His blood pressure is 131/92 (abnormal) and his pulse is 54 (abnormal). His oxygen saturation is 99%.   Wt Readings from Last 3 Encounters:  12/24/22 203 lb (92.1 kg)  06/24/22 208 lb 12.8 oz (94.7 kg)  12/24/21 210 lb (95.3 kg)     Physical Exam Vitals reviewed.  HENT:     Head: Normocephalic and atraumatic.  Eyes:     Pupils: Pupils are equal, round, and reactive to light.  Cardiovascular:     Rate and Rhythm: Normal rate.     Heart sounds: Normal heart sounds.     Comments: Cardiac exam seems to be irregular.  The rate is okay.  He has no obvious murmurs. Pulmonary:     Effort: Pulmonary effort is normal.     Breath sounds: Normal breath sounds.  Abdominal:     General: Bowel sounds are normal.     Palpations: Abdomen is soft.  Musculoskeletal:        General: No tenderness or deformity. Normal range of motion.     Cervical back: Normal range of motion.  Lymphadenopathy:     Cervical: No cervical adenopathy.  Skin:    General: Skin is warm and dry.      Findings: No erythema or rash.  Neurological:     Mental Status: He is alert and oriented to person, place, and time.  Psychiatric:        Behavior: Behavior normal.        Thought Content: Thought content normal.        Judgment: Judgment normal.   Is Lab Results  Component Value Date   WBC 5.6 12/24/2022   HGB 14.7 12/24/2022   HCT 43.6 12/24/2022   MCV 101.4 (H) 12/24/2022   PLT 213 12/24/2022     Chemistry      Component Value Date/Time   NA 138 12/24/2022 1133   NA 144 01/03/2017 1114   K 4.7 12/24/2022 1133   K 4.5 01/03/2017 1114   CL 104 12/24/2022 1133   CL 108 01/03/2017 1114   CO2 25 12/24/2022 1133   CO2 32 01/03/2017 1114   BUN 18 12/24/2022 1133   BUN 13 01/03/2017 1114   CREATININE 0.98 12/24/2022 1133   CREATININE 1.1 01/03/2017 1114      Component Value Date/Time   CALCIUM 9.3 12/24/2022 1133   CALCIUM 9.1 01/03/2017 1114   ALKPHOS 124 12/24/2022 1133   ALKPHOS 125 (H) 01/03/2017 1114   AST 13 (L) 12/24/2022 1133   ALT 8 12/24/2022 1133   ALT 22 01/03/2017 1114   BILITOT 0.7 12/24/2022 1133      Impression and Plan:  Adam Melton is a 82 year old white male. He developed a thrombus in the right lower leg. He has had prior thromboembolic disease. He was taken off blood thinner with Coumadin and then developed another thrombus.  Again, he is on lifelong anticoagulation from my point of view.  I think 5 mg is appropriate for him.  I just do not think that this should not cause any problems and that it should be effective.  Again, I am not sure exactly what is going on with the heart.  We will have to see what the EKG signifies.  We will have to talk to cardiology tomorrow.  I would like to still see him back in about 6 months.  If there is any kind of heart issues, we will certainly let his family doctor know about this.   Josph Macho, MD 6/11/202412:43 PM

## 2022-12-31 ENCOUNTER — Ambulatory Visit: Payer: Medicare HMO | Attending: Cardiology | Admitting: Cardiology

## 2022-12-31 VITALS — BP 120/78 | HR 76 | Ht 72.0 in | Wt 201.0 lb

## 2022-12-31 DIAGNOSIS — I4819 Other persistent atrial fibrillation: Secondary | ICD-10-CM

## 2022-12-31 DIAGNOSIS — F09 Unspecified mental disorder due to known physiological condition: Secondary | ICD-10-CM | POA: Diagnosis not present

## 2022-12-31 DIAGNOSIS — I447 Left bundle-branch block, unspecified: Secondary | ICD-10-CM | POA: Insufficient documentation

## 2022-12-31 DIAGNOSIS — E7800 Pure hypercholesterolemia, unspecified: Secondary | ICD-10-CM | POA: Diagnosis not present

## 2022-12-31 DIAGNOSIS — R079 Chest pain, unspecified: Secondary | ICD-10-CM

## 2022-12-31 DIAGNOSIS — R0609 Other forms of dyspnea: Secondary | ICD-10-CM

## 2022-12-31 MED ORDER — RIVAROXABAN 20 MG PO TABS: 20.0000 mg | ORAL_TABLET | Freq: Every day | ORAL | 0 refills | Status: DC

## 2022-12-31 MED ORDER — RIVAROXABAN 20 MG PO TABS: 20.0000 mg | ORAL_TABLET | Freq: Every day | ORAL | 3 refills | Status: DC

## 2022-12-31 NOTE — Patient Instructions (Signed)
Medication Instructions:   INCREASE: Xarelto to 20mg  daily  *If you need a refill on your cardiac medications before your next appointment, please call your pharmacy*   Lab Work: None ordered If you have labs (blood work) drawn today and your tests are completely normal, you will receive your results only by: MyChart Message (if you have MyChart) OR A paper copy in the mail If you have any lab test that is abnormal or we need to change your treatment, we will call you to review the results.   Testing/Procedures: Your physician has requested that you have a lexiscan myoview. For further information please visit [email or web address] Please follow instruction sheet, as given.  The test will take approximately 3 to 4 hours to complete; you may bring reading material.  If someone comes with you to your appointment, they will need to remain in the main lobby due to limited space in the testing area.    How to prepare for your Myocardial Perfusion Test: Do not eat or drink 3 hours prior to your test, except you may have water. Do not consume products containing caffeine (regular or decaffeinated) 12 hours prior to your test. (ex: coffee, chocolate, sodas, tea). Do bring a list of your current medications with you.  If not listed below, you may take your medications as normal. Do wear comfortable clothes (no dresses or overalls) and walking shoes, tennis shoes preferred (No heels or open toe shoes are allowed). Do NOT wear cologne, perfume, aftershave, or lotions (deodorant is allowed). If these instructions are not followed, your test will have to be rescheduled.  Your physician has requested that you have an echocardiogram. Echocardiography is a painless test that uses sound waves to create images of your heart. It provides your doctor with information about the size and shape of your heart and how well your heart's chambers and valves are working. This procedure takes approximately one hour.  There are no restrictions for this procedure.   Follow-Up: At [facility], you and your health needs are our priority.  As part of our continuing mission to provide you with exceptional heart care, we have created designated Provider Care Teams.  These Care Teams include your primary Cardiologist (physician) and Advanced Practice Providers (APPs -  Physician Assistants and Nurse Practitioners) who all work together to provide you with the care you need, when you need it.  We recommend signing up for the patient portal called "MyChart".  Sign up information is provided on this After Visit Summary.  MyChart is used to connect with patients for Virtual Visits (Telemedicine).  Patients are able to view lab/test results, encounter notes, upcoming appointments, etc.  Non-urgent messages can be sent to your provider as well.   To learn more about what you can do with MyChart, go to [email or web address].    Your next appointment:   1 month(s)  The format for your next appointment:   In Person  Provider:   Gypsy Balsam, MD   Other Instructions Cardiac Nuclear Scan A cardiac nuclear scan is a test that is done to check the flow of blood to your heart. It is done when you are resting and when you are exercising. The test looks for problems such as: Not enough blood reaching a portion of the heart. The heart muscle not working as it should. You may need this test if: You have heart disease. You have had lab results that are not normal. You have had heart surgery or a balloon procedure  to open up blocked arteries (angioplasty). You have chest pain. You have shortness of breath. In this test, a special dye (tracer) is put into your bloodstream. The tracer will travel to your heart. A camera will then take pictures of your heart to see how the tracer moves through your heart. This test is usually done at a hospital and takes 2-4 hours. Tell a doctor about: Any allergies you have. All  medicines you are taking, including vitamins, herbs, eye drops, creams, and over-the-counter medicines. Any problems you or family members have had with anesthetic medicines. Any blood disorders you have. Any surgeries you have had. Any medical conditions you have. Whether you are pregnant or may be pregnant. What are the risks? Generally, this is a safe test. However, problems may occur, such as: Serious chest pain and heart attack. This is only a risk if the stress portion of the test is done. Rapid heartbeat. A feeling of warmth in your chest. This feeling usually does not last long. Allergic reaction to the tracer. What happens before the test? Ask your doctor about changing or stopping your normal medicines. This is important. Follow instructions from your doctor about what you cannot eat or drink. Remove your jewelry on the day of the test. What happens during the test? An IV tube will be inserted into one of your veins. Your doctor will give you a small amount of tracer through the IV tube. You will wait for 20-40 minutes while the tracer moves through your bloodstream. Your heart will be monitored with an electrocardiogram (ECG). You will lie down on an exam table. Pictures of your heart will be taken for about 15-20 minutes. You may also have a stress test. For this test, one of these things may be done: You will be asked to exercise on a treadmill or a stationary bike. You will be given medicines that will make your heart work harder. This is done if you are unable to exercise. When blood flow to your heart has peaked, a tracer will again be given through the IV tube. After 20-40 minutes, you will get back on the exam table. More pictures will be taken of your heart. Depending on the tracer that is used, more pictures may need to be taken 3-4 hours later. Your IV tube will be removed when the test is over. The test may vary among doctors and hospitals. What happens after the  test? Ask your doctor: Whether you can return to your normal schedule, including diet, activities, and medicines. Whether you should drink more fluids. This will help to remove the tracer from your body. Drink enough fluid to keep your pee (urine) pale yellow. Ask your doctor, or the department that is doing the test: When will my results be ready? How will I get my results? Summary A cardiac nuclear scan is a test that is done to check the flow of blood to your heart. Tell your doctor whether you are pregnant or may be pregnant. Before the test, ask your doctor about changing or stopping your normal medicines. This is important. Ask your doctor whether you can return to your normal activities. You may be asked to drink more fluids. This information is not intended to replace advice given to you by your health care provider. Make sure you discuss any questions you have with your health care provider. Document Revised: 10/21/2018 Document Reviewed: 12/15/2017 Elsevier Patient Education  2021 Elsevier Inc.    Echocardiogram An echocardiogram is a test  that uses sound waves (ultrasound) to produce images of the heart. Images from an echocardiogram can provide important information about: Heart size and shape. The size and thickness and movement of your heart's walls. Heart muscle function and strength. Heart valve function or if you have stenosis. Stenosis is when the heart valves are too narrow. If blood is flowing backward through the heart valves (regurgitation). A tumor or infectious growth around the heart valves. Areas of heart muscle that are not working well because of poor blood flow or injury from a heart attack. Aneurysm detection. An aneurysm is a weak or damaged part of an artery wall. The wall bulges out from the normal force of blood pumping through the body. Tell a health care provider about: Any allergies you have. All medicines you are taking, including vitamins, herbs,  eye drops, creams, and over-the-counter medicines. Any blood disorders you have. Any surgeries you have had. Any medical conditions you have. Whether you are pregnant or may be pregnant. What are the risks? Generally, this is a safe test. However, problems may occur, including an allergic reaction to dye (contrast) that may be used during the test. What happens before the test? No specific preparation is needed. You may eat and drink normally. What happens during the test? You will take off your clothes from the waist up and put on a hospital gown. Electrodes or electrocardiogram (ECG)patches may be placed on your chest. The electrodes or patches are then connected to a device that monitors your heart rate and rhythm. You will lie down on a table for an ultrasound exam. A gel will be applied to your chest to help sound waves pass through your skin. A handheld device, called a transducer, will be pressed against your chest and moved over your heart. The transducer produces sound waves that travel to your heart and bounce back (or "echo" back) to the transducer. These sound waves will be captured in real-time and changed into images of your heart that can be viewed on a video monitor. The images will be recorded on a computer and reviewed by your health care provider. You may be asked to change positions or hold your breath for a short time. This makes it easier to get different views or better views of your heart. In some cases, you may receive contrast through an IV in one of your veins. This can improve the quality of the pictures from your heart. The procedure may vary among health care providers and hospitals.    What can I expect after the test? You may return to your normal, everyday life, including diet, activities, and medicines, unless your health care provider tells you not to do that. Follow these instructions at home: It is up to you to get the results of your test. Ask your health  care provider, or the department that is doing the test, when your results will be ready. Keep all follow-up visits. This is important. Summary An echocardiogram is a test that uses sound waves (ultrasound) to produce images of the heart. Images from an echocardiogram can provide important information about the size and shape of your heart, heart muscle function, heart valve function, and other possible heart problems. You do not need to do anything to prepare before this test. You may eat and drink normally. After the echocardiogram is completed, you may return to your normal, everyday life, unless your health care provider tells you not to do that. This information is not intended to replace  advice given to you by your health care provider. Make sure you discuss any questions you have with your health care provider. Document Revised: 02/22/2020 Document Reviewed: 02/22/2020 Elsevier Patient Education  2021 [employer].

## 2022-12-31 NOTE — Addendum Note (Signed)
Addended by: Baldo Ash D on: 12/31/2022 03:07 PM   Modules accepted: Orders

## 2022-12-31 NOTE — Addendum Note (Signed)
Addended by: Baldo Ash D on: 12/31/2022 02:51 PM   Modules accepted: Orders

## 2022-12-31 NOTE — Progress Notes (Signed)
Cardiology Consultation:    Date:  12/31/2022   ID:  Adam Melton, DOB May 21, 1941, MRN [id number]  PCP:  Jacquelin Hawking, PA-C  Cardiologist:  Gypsy Balsam, MD   Referring MD: No ref. provider found   Chief Complaint  Patient presents with   Abnormal ECG  Atrial fibrillation  History of Present Illness:    Adam Melton is a 82 y.o. male who is being seen today for the evaluation of atrial fibrillation at the request of No ref. provider found.  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  He was referred to [place] because incidentally he was discovered to have irregular heartbeats.  EKG has been done which showed atrial fibrillation controlled ventricular rate left bundle branch block.  He is completely unaware of it.  Denies have any chest pain tightness squeezing pressure burning chest.  He does have some unsteadiness while walking but no recent falls.  He is trying to be a little more active trying to exercise more.  Denies have any chest pain tightness squeezing pressure burning chest however his exercise ability is limited never had any heart trouble does not smoke does have family history of atrial fibrillation as well as left bundle branch block interesting his brother have left bundle branch block  Past Medical History:  Diagnosis Date   Anxiety    Baker's cyst of knee 03/2016   Right   Cataract    Bilat; no surgery   Closed head injury 2006   Colon polyps 11/2008 & 2017   Diverticulosis 11/2008   DVT of lower limb, acute ([name]) 03/19/2016   (recurrent-->first one was L saph vein in 2015.  2017 Right gastroc (xarelto started).  Full thrombophilia panel by Dr. Myna Hidalgo 09/2016 was NORMAL.  Dr. Myna Hidalgo recommended low dose xarelto continuation in order to decrease the risk  of recurrent DVT (finished 1 yr of full dose anticoag 03/2017).  Xarelto 5mg  qd indefinitely as per 03/2018 and 11/2018 hem f/u.   Glaucoma    Primary open angle glaucoma OU, mild stage Cain Saupe, MD)-stable as of 09/2017 ophth f/u.   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  Last seizure 2007.   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  (Ptosis and meiosis)   Thrombosis of saphenous vein 2015   Left; [place] in [place] put [place] on xarelto x 3 mo.  F/u ultrasound was NORMAL.   Tricompartmental disease of knee 2014   right knee   Vitamin B12 deficiency 04/29/2019   rpt in low normal range (before any replacment b12), IF ab NEG, methylmalonic acid level normal.    Past Surgical History:  Procedure Laterality Date  CARPAL TUNNEL RELEASE Right    CARPAL TUNNEL RELEASE     COLONOSCOPY W/ POLYPECTOMY  12/02/08; 10/12/15   Recall 3 yrs (Dr. 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    Current Medications: Current Meds  Medication Sig   Ascorbic Acid (VITAMIN C PO) Take 1 tablet by mouth daily.   busPIRone (BUSPAR)  5 MG tablet Take 1 tablet (5 mg total) by mouth daily. (Patient taking differently: Take 5 mg by mouth 2 (two) times daily.)   Cholecalciferol (VITAMIN D PO) Take 1,000 Int'l Units/day by mouth daily.   Cyanocobalamin (B-12 PO) Take 1 tablet by mouth daily.   DILANTIN 100 MG ER capsule TAKE TWO CAPSULES BY MOUTH EVERY MORNING AND TAKE THREE CAPSULES AT BEDTIME (Patient taking differently: Take 200 mg by mouth 2 (two) times daily. 12/24/2021 Takes 2 capsules BID.)   latanoprost (XALATAN) 0.005 % ophthalmic solution Place 1 drop into both eyes at bedtime.   XARELTO 2.5 MG TABS tablet TAKE TWO TABLETS (5MG  TOTAL) BY MOUTH DAILY (Patient taking differently: Take 2.5 mg by mouth 2 (two) times daily.)     Allergies:   Azithromycin, Penicillins, and Shellfish allergy   Social History   Socioeconomic History   Marital status: Married    Spouse name: Not on file   Number of children: Not on file   Years of education: Not on file   Highest education level: Not on file  Occupational History   Not on file  Tobacco Use   Smoking status: Never   Smokeless tobacco: Never  Vaping Use   Vaping Use: Never used  Substance and Sexual Activity   Alcohol use: No    Alcohol/week: 0.0 standard drinks of alcohol   Drug use: No   Sexual activity: Not Currently  Other Topics Concern   Not on file  Social History Narrative   Married, has one daughter and 3 grandchildren.   Relocated from [place], [place] 05/2015.   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   Social Determinants of Health   Financial Resource Strain: Low Risk  (04/07/2021)   Overall Financial Resource Strain (CARDIA)    Difficulty of Paying Living Expenses: Not hard at all  Food Insecurity: No Food Insecurity (04/07/2021)   Hunger Vital Sign    Worried About Running Out of Food in the Last Year: Never true    Ran Out of Food in the Last Year: Never true  Transportation Needs: No Transportation Needs (04/07/2021)   PRAPARE -  Administrator, Civil Service (Medical): No    Lack of Transportation (Non-Medical): No  Physical Activity: Insufficiently Active (04/07/2021)   Exercise Vital Sign    Days of Exercise per Week: 2 days    Minutes of Exercise per Session: 60 min  Stress: No Stress Concern Present (04/07/2021)   [employer] of Occupational Health - Occupational Stress Questionnaire    Feeling of Stress : Not at all  Social Connections: Socially Integrated (04/07/2021)   Social Connection and Isolation Panel [NHANES]    Frequency of Communication with Friends and Family: Twice a week  Frequency of Social Gatherings with Friends and Family: More than three times a week    Attends Religious Services: More than 4 times per year    Active Member of [employer] or Organizations: Yes    Attends Engineer, structural: More than 4 times per year    Marital Status: Married     Family History: The patient's family history includes Alcohol abuse in his father; Cancer in his sister; Heart disease in his brother; Stroke in his mother. There is no history of Diabetes or Colon cancer. ROS:   Please see the history of present illness.    All 14 point review of systems negative except as described per history of present illness.  EKGs/Labs/Other Studies Reviewed:    The following studies were reviewed today:  EKG:  EKG is  ordered today.  The ekg ordered today demonstrates atrial fibrillation with controlled ventricular rate, left bundle branch block  Recent Labs: 12/24/2022: ALT 8; BUN 18; Creatinine 0.98; Hemoglobin 14.7; Platelet Count 213; Potassium 4.7; Sodium 138  Recent Lipid Panel    Component Value Date/Time   CHOL 212 (H) 04/25/2020 0943   TRIG 126.0 04/25/2020 0943   HDL 49.60 04/25/2020 0943   CHOLHDL 4 04/25/2020 0943   VLDL 25.2 04/25/2020 0943   LDLCALC 137 (H) 04/25/2020 0943    Physical Exam:    VS:  BP 120/78 (BP Location: Left Arm, Patient Position: Sitting)   Pulse 76    Ht 6' (1.829 m)   Wt 201 lb (91.2 kg)   SpO2 99%   BMI 27.26 kg/m     Wt Readings from Last 3 Encounters:  12/31/22 201 lb (91.2 kg)  12/24/22 203 lb (92.1 kg)  06/24/22 208 lb 12.8 oz (94.7 kg)     GEN:  Well nourished, well developed in no acute distress HEENT: Normal NECK: No JVD; No carotid bruits LYMPHATICS: No lymphadenopathy CARDIAC: RRR, no murmurs, no rubs, no gallops RESPIRATORY:  Clear to auscultation without rales, wheezing or rhonchi  ABDOMEN: Soft, non-tender, non-distended MUSCULOSKELETAL:  No edema; No deformity  SKIN: Warm and dry NEUROLOGIC:  Alert and oriented x 3 PSYCHIATRIC:  Normal affect   ASSESSMENT:    1. Persistent atrial fibrillation ([name])   2. Left bundle branch block   3. Hypercholesterolemia   4. Organic brain syndrome    PLAN:    In order of problems listed above:  Persistent atrial fibrillation: Rate controlled, anticoagulated but need to take 20 mg Xarelto which I will increase the dose of that medication.  We will not use any more AV blocking agent his rate is controlled.  He required at least months of anticoagulation before attempt of cardioversion.  In the meantime I will schedule him to have echocardiogram to look at the size of the atrium and try to get some sense about potential success with cardioversion.  Also schedule him to have stress test to guide as for future antiarrhythmic therapy if needed. Left bundle branch block which is incidental finding.  Echocardiogram will be done to assess left ventricle ejection fraction stress test will be done to rule out significant obstructive coronary disease. Hyperlipidemia will repeat his fasting lipid profile. Regarding brain syndrome.  Noted this is posttraumatic 17 years ago and stable from that point review.   Medication Adjustments/Labs and Tests Ordered: Current medicines are reviewed at length with the patient today.  Concerns regarding medicines are outlined above.  No orders of  the defined types were placed in this  encounter.  No orders of the defined types were placed in this encounter.   Signed, Georgeanna Lea, MD, [facility] 12/31/2022 2:39 PM    [facility]

## 2023-01-03 ENCOUNTER — Telehealth: Payer: Self-pay | Admitting: Cardiology

## 2023-01-03 NOTE — Telephone Encounter (Signed)
Pt c/o medication issue:  1. Name of Medication: DILANTIN 100 MG ER capsule   2. How are you currently taking this medication (dosage and times per day)?   TAKE TWO CAPSULES BY MOUTH EVERY MORNING AND TAKE THREE CAPSULES AT BEDTIMEPatient taking differently: Take 200 mg by mouth 2 (two) times daily. 12/24/2021 Takes 2 capsules BID.    3. Are you having a reaction (difficulty breathing--STAT)? No  4. What is your medication issue? Pt would like to know if the medication will interfere with the Stress Test. Please advise.

## 2023-01-03 NOTE — Telephone Encounter (Signed)
Spoke with spouse regarding medications and stress test. Advised that the Nuclear medicine nurse will call the day before and go over the test and his medications.

## 2023-01-29 ENCOUNTER — Ambulatory Visit (HOSPITAL_BASED_OUTPATIENT_CLINIC_OR_DEPARTMENT_OTHER): Payer: Medicare HMO

## 2023-01-30 ENCOUNTER — Telehealth (HOSPITAL_COMMUNITY): Payer: Self-pay | Admitting: *Deleted

## 2023-01-30 NOTE — Telephone Encounter (Signed)
Patient given detailed instructions per Myocardial Perfusion Study Information Sheet for the test on 02/03/2023 at 10:00. Patient notified to arrive 15 minutes early and that it is imperative to arrive on time for appointment to keep from having the test rescheduled.  If you need to cancel or reschedule your appointment, please call the office within 24 hours of your appointment. . Patient verbalized understanding.Daneil Dolin

## 2023-01-31 ENCOUNTER — Encounter (HOSPITAL_COMMUNITY): Payer: Medicare HMO

## 2023-01-31 NOTE — Addendum Note (Signed)
Addended by: Baldo Ash D on: 01/31/2023 10:26 AM   Modules accepted: Orders

## 2023-02-03 ENCOUNTER — Ambulatory Visit (HOSPITAL_COMMUNITY): Payer: Medicare HMO | Attending: Cardiology

## 2023-02-03 DIAGNOSIS — R079 Chest pain, unspecified: Secondary | ICD-10-CM | POA: Insufficient documentation

## 2023-02-03 LAB — MYOCARDIAL PERFUSION IMAGING
LV dias vol: 164 mL (ref 62–150)
LV sys vol: 133 mL
Nuc Stress EF: 19 %
Peak HR: 99 {beats}/min
Rest HR: 83 {beats}/min
Rest Nuclear Isotope Dose: 10.6 mCi
SDS: 0
SRS: 6
SSS: 6
ST Depression (mm): 0 mm
Stress Nuclear Isotope Dose: 31.6 mCi
TID: 1.02

## 2023-02-03 MED ORDER — TECHNETIUM TC 99M TETROFOSMIN IV KIT
10.6000 | PACK | Freq: Once | INTRAVENOUS | Status: AC | PRN
  Administered 2023-02-03: 10.6 via INTRAVENOUS

## 2023-02-03 MED ORDER — REGADENOSON 0.4 MG/5ML IV SOLN
0.4000 mg | Freq: Once | INTRAVENOUS | Status: AC
Start: 2023-02-03 — End: 2023-02-03
  Administered 2023-02-03: 0.4 mg via INTRAVENOUS

## 2023-02-03 MED ORDER — TECHNETIUM TC 99M TETROFOSMIN IV KIT
31.6000 | PACK | Freq: Once | INTRAVENOUS | Status: AC | PRN
  Administered 2023-02-03: 31.6 via INTRAVENOUS

## 2023-02-04 ENCOUNTER — Telehealth: Payer: Self-pay | Admitting: Cardiology

## 2023-02-04 ENCOUNTER — Telehealth: Payer: Self-pay

## 2023-02-04 NOTE — Telephone Encounter (Signed)
LM to return my call. 

## 2023-02-04 NOTE — Telephone Encounter (Signed)
Results reviewed with pt as per Dr. Krasowski's note.  Pt verbalized understanding and had no additional questions. Routed to PCP  

## 2023-02-04 NOTE — Telephone Encounter (Signed)
Pt was returning CMA call regarding results and is requesting a callback. Please advise

## 2023-02-04 NOTE — Telephone Encounter (Signed)
-----   Message from Gypsy Balsam sent at 02/04/2023 12:52 PM EDT ----- Stress test showing no ischemia

## 2023-02-11 NOTE — Addendum Note (Signed)
Addended by: Gypsy Balsam on: 02/11/2023 12:23 PM   Modules accepted: Orders

## 2023-02-12 ENCOUNTER — Ambulatory Visit (HOSPITAL_BASED_OUTPATIENT_CLINIC_OR_DEPARTMENT_OTHER)
Admission: RE | Admit: 2023-02-12 | Discharge: 2023-02-12 | Disposition: A | Discharge status: Home / Self Care | Payer: Medicare HMO | Source: Ambulatory Visit | Attending: Cardiology | Admitting: Cardiology

## 2023-02-12 DIAGNOSIS — R0609 Other forms of dyspnea: Secondary | ICD-10-CM | POA: Diagnosis present

## 2023-02-12 LAB — ECHOCARDIOGRAM COMPLETE
AR max vel: 3.6 cm2
AV Area VTI: 3.3 cm2
AV Area mean vel: 3.54 cm2
AV Mean grad: 2 mmHg
AV Peak grad: 3.7 mmHg
Ao pk vel: 0.96 m/s
Area-P 1/2: 2.66 cm2
Calc EF: 35.9 %
MV M vel: 2.75 m/s
MV Peak grad: 30.3 mmHg
S' Lateral: 4.5 cm
Single Plane A2C EF: 34.4 %
Single Plane A4C EF: 37.2 %

## 2023-02-18 ENCOUNTER — Telehealth: Payer: Self-pay

## 2023-02-18 DIAGNOSIS — R079 Chest pain, unspecified: Secondary | ICD-10-CM

## 2023-02-18 DIAGNOSIS — I4819 Other persistent atrial fibrillation: Secondary | ICD-10-CM

## 2023-02-18 DIAGNOSIS — R0609 Other forms of dyspnea: Secondary | ICD-10-CM

## 2023-02-18 MED ORDER — ENTRESTO 24-26 MG PO TABS: 1.0000 | ORAL_TABLET | Freq: Two times a day (BID) | ORAL | 1 refills | Status: DC

## 2023-02-18 NOTE — Telephone Encounter (Signed)
Patient notified of results and recommendations and agreed with plan, medication sent, lab order on file.   

## 2023-02-18 NOTE — Telephone Encounter (Signed)
-----   Message from Gypsy Balsam sent at 02/14/2023  8:44 AM EDT ----- Echocardiogram showed diminished ejection fraction, will need to start Entresto 24-26 twice daily, Chem-7 need to be done 1 week

## 2023-02-25 ENCOUNTER — Ambulatory Visit: Payer: Medicare HMO | Attending: Cardiology | Admitting: Cardiology

## 2023-02-25 ENCOUNTER — Encounter: Payer: Self-pay | Admitting: Cardiology

## 2023-02-25 VITALS — BP 132/80 | HR 63 | Ht 72.0 in | Wt 204.0 lb

## 2023-02-25 DIAGNOSIS — G40909 Epilepsy, unspecified, not intractable, without status epilepticus: Secondary | ICD-10-CM | POA: Diagnosis not present

## 2023-02-25 DIAGNOSIS — I4819 Other persistent atrial fibrillation: Secondary | ICD-10-CM | POA: Diagnosis not present

## 2023-02-25 DIAGNOSIS — I420 Dilated cardiomyopathy: Secondary | ICD-10-CM | POA: Diagnosis not present

## 2023-02-25 DIAGNOSIS — I447 Left bundle-branch block, unspecified: Secondary | ICD-10-CM

## 2023-02-25 NOTE — Progress Notes (Signed)
Cardiology Office Note:    Date:  02/25/2023   ID:  Adam Melton, DOB [birth year], MRN [id number]  PCP:  Jacquelin Hawking, PA-C  Cardiologist:  Gypsy Balsam, MD    Referring MD: Jacquelin Hawking, PA-C   Chief Complaint  Patient presents with   Results    History of Present Illness:    Adam Melton is a 82 y.o. male past medical history significant for left bundle branch block, organic brain injury, seizure disorder he was referred to [place] because of incidental discovery showing atrial fibrillation.  He is rate is controlled.  We initiated anticoagulation as a part evaluation echocardiogram being done which showed ejection fraction 35 to 40%.  G left atrium is severely enlarged, he also had a stress test with preparation for possible antiarrhythmic use he is left atrial size is normal.  He comes today to months for follow-up.  Overall he seems to be doing well described to have some shortness of breath with exertion no swelling he was given Entresto but did not start taking it yet.  Past Medical History:  Diagnosis Date   Anxiety    Baker's cyst of knee 03/2016   Right   Cataract    Bilat; no surgery   Closed head injury 2006   Colon polyps 11/2008 & 2017   Diverticulosis 11/2008   DVT of lower limb, acute ([name]) 03/19/2016   (recurrent-->first one was L saph vein in 2015.  2017 Right gastroc (xarelto started).  Full thrombophilia panel by Dr. Myna Hidalgo 09/2016 was NORMAL.  Dr. Myna Hidalgo recommended low dose xarelto continuation in order to decrease the risk of recurrent DVT (finished 1 yr of full dose anticoag 03/2017).  Xarelto 5mg  qd indefinitely as per 03/2018 and 11/2018 hem f/u.   Glaucoma    Primary open angle glaucoma OU, mild stage Cain Saupe, MD)-stable as of 09/2017 ophth f/u.   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  Last seizure 2007.   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  (Ptosis and meiosis)   Thrombosis of saphenous vein 2015   Left; [place] in [place] put [place] on xarelto x 3 mo.  F/u ultrasound was NORMAL.   Tricompartmental disease of knee 2014   right knee   Vitamin B12 deficiency 04/29/2019   rpt in low normal range (before any replacment b12), IF ab NEG, methylmalonic acid level normal.    Past Surgical History:  Procedure Laterality Date   CARPAL TUNNEL RELEASE Right    CARPAL TUNNEL RELEASE     COLONOSCOPY W/ POLYPECTOMY  12/02/08; 10/12/15   Recall 3 yrs (Dr. 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    Current Medications: Current Meds  Medication Sig   Ascorbic Acid (VITAMIN C PO) Take 1 tablet by mouth daily.   busPIRone (BUSPAR) 5 MG tablet Take 1 tablet (5 mg total) by mouth daily. (Patient taking differently: Take 5 mg by mouth 2 (two) times daily.)   Cholecalciferol (VITAMIN D PO) Take 1,000 Int'l Units/day by mouth daily.   Cyanocobalamin (B-12 PO) Take 1 tablet by mouth daily.   DILANTIN 100 MG ER capsule TAKE TWO CAPSULES BY MOUTH EVERY MORNING AND TAKE THREE CAPSULES AT BEDTIME (Patient taking differently: Take 200 mg by mouth 2 (two) times  daily. 12/24/2021 Takes 2 capsules BID.)   latanoprost (XALATAN) 0.005 % ophthalmic solution Place 1 drop into both eyes at bedtime.   rivaroxaban (XARELTO) 20 MG TABS tablet Take 1 tablet (20 mg total) by mouth daily with supper.   sacubitril-valsartan (ENTRESTO) 24-26 MG Take 1 tablet by mouth 2 (two) times daily.   [DISCONTINUED] rivaroxaban (XARELTO) 20 MG TABS tablet Take 1 tablet (20 mg total) by mouth daily with supper.     Allergies:   Azithromycin, Penicillins, and Shellfish allergy   Social History   Socioeconomic History   Marital status: Married    Spouse name: Not on file   Number of children: Not on file   Years of education: Not on file   Highest education level: Not on file  Occupational History   Not on file  Tobacco Use   Smoking status: Never   Smokeless tobacco: Never  Vaping Use   Vaping status: Never Used  Substance and Sexual Activity   Alcohol use: No    Alcohol/week: 0.0 standard drinks of alcohol   Drug use: No   Sexual activity: Not Currently  Other Topics Concern   Not on file  Social History Narrative   Married, has one daughter and 3 grandchildren.   Relocated from [place], [place] 05/2015.   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   Social Determinants of Health   Financial Resource Strain: Low Risk  (07/31/2022)   Received from [facility], [facility]   Overall Financial Resource Strain (CARDIA)    Difficulty of Paying Living Expenses: Not hard at all  Food Insecurity: No Food Insecurity (07/31/2022)   Received from [facility], [facility]   Hunger Vital Sign    Worried About Running Out of Food in the Last Year: Never true    Ran Out of Food in the Last Year: Never true  Transportation Needs: No Transportation Needs (07/31/2022)   Received from [facility], [facility]   PRAPARE - Transportation    Lack of Transportation (Medical): No    Lack of Transportation (Non-Medical): No  Physical Activity: Insufficiently Active  (04/07/2021)   Exercise Vital Sign    Days of Exercise per Week: 2 days    Minutes of Exercise per Session: 60 min  Stress: No Stress Concern Present (04/07/2021)   [employer] of Occupational Health - Occupational Stress Questionnaire    Feeling of Stress : Not at all  Social Connections: Unknown (11/12/2021)   Received from [facility], [facility]   Social Network    Social Network: Not on file     Family History: The patient's family history includes Alcohol abuse in his father; Cancer in his sister; Heart disease in  his brother; Stroke in his mother. There is no history of Diabetes or Colon cancer. ROS:   Please see the history of present illness.    All 14 point review of systems negative except as described per history of present illness  EKGs/Labs/Other Studies Reviewed:         Recent Labs: 12/24/2022: ALT 8; BUN 18; Creatinine 0.98; Hemoglobin 14.7; Platelet Count 213; Potassium 4.7; Sodium 138  Recent Lipid Panel    Component Value Date/Time   CHOL 212 (H) 04/25/2020 0943   TRIG 126.0 04/25/2020 0943   HDL 49.60 04/25/2020 0943   CHOLHDL 4 04/25/2020 0943   VLDL 25.2 04/25/2020 0943   LDLCALC 137 (H) 04/25/2020 0943    Physical Exam:    VS:  BP 132/80 (BP Location: Left Arm, Patient Position: Sitting)   Pulse 63   Ht 6' (1.829 m)   Wt 204 lb (92.5 kg)   SpO2 92%   BMI 27.67 kg/m     Wt Readings from Last 3 Encounters:  02/25/23 204 lb (92.5 kg)  12/31/22 201 lb (91.2 kg)  12/24/22 203 lb (92.1 kg)     GEN:  Well nourished, well developed in no acute distress HEENT: Normal NECK: No JVD; No carotid bruits LYMPHATICS: No lymphadenopathy CARDIAC: Irregularly, no murmurs, no rubs, no gallops RESPIRATORY:  Clear to auscultation without rales, wheezing or rhonchi  ABDOMEN: Soft, non-tender, non-distended MUSCULOSKELETAL:  No edema; No deformity  SKIN: Warm and dry LOWER EXTREMITIES: no swelling NEUROLOGIC:  Alert and oriented x 3 PSYCHIATRIC:   Normal affect   ASSESSMENT:    1. Left bundle branch block   2. Persistent atrial fibrillation ([name])   3. Seizure disorder ([name])   4. Dilated cardiomyopathy ([name]) ejection fraction 35 to 40% based on echocardiogram 2024    PLAN:    In order of problems listed above:  Persistent atrial fibrillation.  Rate is controlled continue anticoagulation I see him I will talked about potential cardioversion I want to start medication for cardiomyopathy before going to cardioversion. Cardiomyopathy ejection fraction 40% will initiate Entresto and Chem-7 will be checked next week. Seizure disorder stable. Left bundle branch block noted.  However ejection fraction is less than that I would anticipate with just left bundle branch block   Medication Adjustments/Labs and Tests Ordered: Current medicines are reviewed at length with the patient today.  Concerns regarding medicines are outlined above.  No orders of the defined types were placed in this encounter.  Medication changes: No orders of the defined types were placed in this encounter.   Signed, Georgeanna Lea, MD, [facility] 02/25/2023 3:53 PM    [facility]

## 2023-02-25 NOTE — Patient Instructions (Addendum)
Medication Instructions:   START: Entresto 24/26 1 tablet twice daily   Lab Work: 3rd Floor    [address]   Your physician recommends that you return for lab work in:  1 week  You need to have labs done when you are fasting.  You can come Monday through Friday 8:00 am to 11:30 am and 1:00 to 4:00. You do not need to make an appointment as the order has already been placed.   Testing/Procedures: None Ordered   Follow-Up: At [facility], you and your health needs are our priority.  As part of our continuing mission to provide you with exceptional heart care, we have created designated Provider Care Teams.  These Care Teams include your primary Cardiologist (physician) and Advanced Practice Providers (APPs -  Physician Assistants and Nurse Practitioners) who all work together to provide you with the care you need, when you need it.  We recommend signing up for the patient portal called "MyChart".  Sign up information is provided on this After Visit Summary.  MyChart is used to connect with patients for Virtual Visits (Telemedicine).  Patients are able to view lab/test results, encounter notes, upcoming appointments, etc.  Non-urgent messages can be sent to your provider as well.   To learn more about what you can do with MyChart, go to [email or web address].    Your next appointment:   1 month(s)  The format for your next appointment:   In Person  Provider:   Gypsy Balsam, MD    Other Instructions NA

## 2023-02-26 ENCOUNTER — Telehealth: Payer: Self-pay

## 2023-02-26 NOTE — Telephone Encounter (Signed)
Spoke with Randa Evens, notified of results

## 2023-02-26 NOTE — Telephone Encounter (Signed)
-----   Message from Gypsy Balsam sent at 02/26/2023 10:14 AM EDT ----- Chem-7 looks good, continue present management

## 2023-03-06 ENCOUNTER — Telehealth: Payer: Self-pay | Admitting: Cardiology

## 2023-03-06 DIAGNOSIS — Z79899 Other long term (current) drug therapy: Secondary | ICD-10-CM

## 2023-03-06 NOTE — Telephone Encounter (Signed)
Patient's wife called stating they went for lab work today and there were no lab order in Epic.  He was told at the last visit, to start [facility] and have labs done in a week.  She would like for the lab order to put entered so he can have lab done.  Please call when order has been entered.

## 2023-03-06 NOTE — Telephone Encounter (Signed)
Order placed to [facility] for BMET  Wife aware

## 2023-03-08 LAB — BASIC METABOLIC PANEL
BUN/Creatinine Ratio: 20 (ref 10–24)
BUN: 19 mg/dL (ref 8–27)
CO2: 22 mmol/L (ref 20–29)
Calcium: 9.3 mg/dL (ref 8.6–10.2)
Chloride: 105 mmol/L (ref 96–106)
Creatinine, Ser: 0.93 mg/dL (ref 0.76–1.27)
Glucose: 97 mg/dL (ref 70–99)
Potassium: 5.1 mmol/L (ref 3.5–5.2)
Sodium: 142 mmol/L (ref 134–144)
eGFR: 82 mL/min/{1.73_m2} (ref >59)

## 2023-03-13 ENCOUNTER — Telehealth: Payer: Self-pay | Admitting: Cardiology

## 2023-03-13 NOTE — Telephone Encounter (Signed)
Follow Up:     Patient is retuning a call, concerning his results.

## 2023-03-13 NOTE — Telephone Encounter (Signed)
Pt was asking about lab results. BMP was normal per Dr. Vanetta Shawl note. Pt verbalized understanding and had no further questions.

## 2023-03-19 ENCOUNTER — Telehealth: Payer: Self-pay

## 2023-03-19 NOTE — Telephone Encounter (Signed)
.  patient is returning phone call

## 2023-03-19 NOTE — Telephone Encounter (Signed)
LM to return my call. 

## 2023-03-19 NOTE — Telephone Encounter (Signed)
-----   Message from Gypsy Balsam sent at 03/14/2023  9:48 AM EDT ----- Labs are stable, continue present management

## 2023-03-20 NOTE — Telephone Encounter (Signed)
Spoke with patient wife Mardene Celeste, notified of results

## 2023-03-28 ENCOUNTER — Ambulatory Visit: Payer: Medicare HMO | Admitting: Cardiology

## 2023-04-09 ENCOUNTER — Other Ambulatory Visit: Payer: Self-pay | Admitting: Cardiology

## 2023-06-25 ENCOUNTER — Inpatient Hospital Stay: Payer: Medicare HMO | Admitting: Hematology & Oncology

## 2023-06-25 ENCOUNTER — Encounter: Payer: Self-pay | Admitting: Hematology & Oncology

## 2023-06-25 ENCOUNTER — Inpatient Hospital Stay: Payer: Medicare HMO | Attending: Hematology & Oncology

## 2023-06-25 VITALS — BP 133/84 | HR 62 | Temp 98.2°F | Resp 18 | Ht 72.0 in | Wt 199.0 lb

## 2023-06-25 DIAGNOSIS — I825Z1 Chronic embolism and thrombosis of unspecified deep veins of right distal lower extremity: Secondary | ICD-10-CM

## 2023-06-25 DIAGNOSIS — I82461 Acute embolism and thrombosis of right calf muscular vein: Secondary | ICD-10-CM | POA: Diagnosis present

## 2023-06-25 DIAGNOSIS — Z7901 Long term (current) use of anticoagulants: Secondary | ICD-10-CM | POA: Diagnosis not present

## 2023-06-25 DIAGNOSIS — I4891 Unspecified atrial fibrillation: Secondary | ICD-10-CM | POA: Insufficient documentation

## 2023-06-25 LAB — CMP (CANCER CENTER ONLY)
ALT: 6 U/L (ref 0–44)
AST: 11 U/L — ABNORMAL LOW (ref 15–41)
Albumin: 4.1 g/dL (ref 3.5–5.0)
Alkaline Phosphatase: 116 U/L (ref 38–126)
Anion gap: 6 (ref 5–15)
BUN: 21 mg/dL (ref 8–23)
CO2: 28 mmol/L (ref 22–32)
Calcium: 9 mg/dL (ref 8.9–10.3)
Chloride: 106 mmol/L (ref 98–111)
Creatinine: 1.06 mg/dL (ref 0.61–1.24)
GFR, Estimated: >60 mL/min (ref >60)
Glucose, Bld: 74 mg/dL (ref 70–99)
Potassium: 5.4 mmol/L — ABNORMAL HIGH (ref 3.5–5.1)
Sodium: 140 mmol/L (ref 135–145)
Total Bilirubin: 0.5 mg/dL (ref <1.2)
Total Protein: 6.4 g/dL — ABNORMAL LOW (ref 6.5–8.1)

## 2023-06-25 LAB — CBC WITH DIFFERENTIAL (CANCER CENTER ONLY)
Abs Immature Granulocytes: 0.05 10*3/uL (ref 0.00–0.07)
Basophils Absolute: 0 10*3/uL (ref 0.0–0.1)
Basophils Relative: 1 %
Eosinophils Absolute: 0.2 10*3/uL (ref 0.0–0.5)
Eosinophils Relative: 3 %
HCT: 43.6 % (ref 39.0–52.0)
Hemoglobin: 14.9 g/dL (ref 13.0–17.0)
Immature Granulocytes: 1 %
Lymphocytes Relative: 13 %
Lymphs Abs: 0.9 10*3/uL (ref 0.7–4.0)
MCH: 35.6 pg — ABNORMAL HIGH (ref 26.0–34.0)
MCHC: 34.2 g/dL (ref 30.0–36.0)
MCV: 104.3 fL — ABNORMAL HIGH (ref 80.0–100.0)
Monocytes Absolute: 0.6 10*3/uL (ref 0.1–1.0)
Monocytes Relative: 9 %
Neutro Abs: 4.8 10*3/uL (ref 1.7–7.7)
Neutrophils Relative %: 73 %
Platelet Count: 210 10*3/uL (ref 150–400)
RBC: 4.18 MIL/uL — ABNORMAL LOW (ref 4.22–5.81)
RDW: 13.8 % (ref 11.5–15.5)
WBC Count: 6.6 10*3/uL (ref 4.0–10.5)
nRBC: 0 % (ref 0.0–0.2)

## 2023-06-25 NOTE — Progress Notes (Signed)
Hematology and Oncology Follow Up Visit  Adam Melton [id number] 1941/07/13 82 y.o. 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  We last saw him back in June.  When we last saw him, he actually was found to have atrial fibrillation.  He is on amiodarone right now.  He is on Entresto.  He is on Coreg.  He has been on Xarelto already.  The dose was increased back up to therapeutic at 20 mg a day.  It sounds like in March, he may have a ablation.  Otherwise he is doing okay.  He really has no complaints.  He has had no nausea or vomiting.  He has had no change in bowel or bladder habits.  He has had no fever.  There has been no problems with COVID.  He has had a little bit of leg swelling but this might be more chronic.  He has had no headache.  He had a very nice Thanksgiving.  It sounds like they will have a very nice Christmas.  Overall, I would say that his performance status is probably ECOG 1-2.   Medications:  Current Outpatient Medications:    amiodarone (PACERONE) 200 MG tablet, Take by mouth., Disp: , Rfl:    Ascorbic Acid (VITAMIN C PO), Take 1 tablet by mouth daily., Disp: , Rfl:    busPIRone (BUSPAR) 5 MG tablet, Take 1 tablet (5 mg total) by mouth daily. (Patient taking differently: Take 5 mg by mouth 2 (two) times daily.), Disp: 30 tablet, Rfl: 0   carvedilol (COREG) 3.125 MG tablet, Take by mouth., Disp: , Rfl:    Cholecalciferol (VITAMIN D PO), Take 1,000 Int'l Units/day by mouth daily., Disp: , Rfl:    Cyanocobalamin (B-12 PO), Take 1 tablet by mouth daily., Disp: , Rfl:    DILANTIN 100 MG ER capsule, TAKE TWO CAPSULES BY MOUTH EVERY MORNING AND TAKE THREE CAPSULES AT BEDTIME (Patient taking differently: Take 200 mg by mouth 2 (two) times daily. 12/24/2021 Takes 2 capsules BID.), Disp: 450 capsule,  Rfl: 0   latanoprost (XALATAN) 0.005 % ophthalmic solution, Place 1 drop into both eyes at bedtime., Disp: , Rfl: 5   rivaroxaban (XARELTO) 20 MG TABS tablet, Take 1 tablet (20 mg total) by mouth daily with supper., Disp: 90 tablet, Rfl: 3   sacubitril-valsartan (ENTRESTO) 24-26 MG, Take 1 tablet by mouth 2 (two) times daily., Disp: 180 tablet, Rfl: 1  Allergies:  Allergies  Allergen Reactions   Azithromycin Other (See Comments)    seizure   Penicillins Nausea And Vomiting and Rash   Shellfish Allergy Swelling    Past Medical History, Surgical history, Social history, and Family History were reviewed and updated.  Review of Systems: Review of Systems  Constitutional: Negative.   HENT: Negative.    Eyes: Negative.   Respiratory: Negative.    Cardiovascular: Negative.   Gastrointestinal: Negative.   Genitourinary: Negative.   Musculoskeletal: Negative.   Skin: Negative.   Neurological: Negative.   Endo/Heme/Allergies: Negative.   Psychiatric/Behavioral: Negative.       Physical Exam:  height is 6' (1.829 m) and weight is 199 lb (90.3 kg). His oral temperature is 98.2 F (36.8 C). His blood pressure is 133/84 and his pulse is 62. His respiration is 18 and oxygen saturation is 100%.   Wt  Readings from Last 3 Encounters:  06/25/23 199 lb (90.3 kg)  02/25/23 204 lb (92.5 kg)  12/31/22 201 lb (91.2 kg)     Physical Exam Vitals reviewed.  HENT:     Head: Normocephalic and atraumatic.  Eyes:     Pupils: Pupils are equal, round, and reactive to light.  Cardiovascular:     Rate and Rhythm: Normal rate.     Heart sounds: Normal heart sounds.     Comments: Cardiac exam seems to be irregular.  The rate is okay.  He has no obvious murmurs. Pulmonary:     Effort: Pulmonary effort is normal.     Breath sounds: Normal breath sounds.  Abdominal:     General: Bowel sounds are normal.     Palpations: Abdomen is soft.  Musculoskeletal:        General: No tenderness or  deformity. Normal range of motion.     Cervical back: Normal range of motion.  Lymphadenopathy:     Cervical: No cervical adenopathy.  Skin:    General: Skin is warm and dry.     Findings: No erythema or rash.  Neurological:     Mental Status: He is alert and oriented to person, place, and time.  Psychiatric:        Behavior: Behavior normal.        Thought Content: Thought content normal.        Judgment: Judgment normal.   Is Lab Results  Component Value Date   WBC 6.6 06/25/2023   HGB 14.9 06/25/2023   HCT 43.6 06/25/2023   MCV 104.3 (H) 06/25/2023   PLT 210 06/25/2023     Chemistry      Component Value Date/Time   NA 140 06/25/2023 1139   NA 142 03/07/2023 0835   NA 144 01/03/2017 1114   K 5.4 (H) 06/25/2023 1139   K 4.5 01/03/2017 1114   CL 106 06/25/2023 1139   CL 108 01/03/2017 1114   CO2 28 06/25/2023 1139   CO2 32 01/03/2017 1114   BUN 21 06/25/2023 1139   BUN 19 03/07/2023 0835   BUN 13 01/03/2017 1114   CREATININE 1.06 06/25/2023 1139   CREATININE 1.1 01/03/2017 1114      Component Value Date/Time   CALCIUM 9.0 06/25/2023 1139   CALCIUM 9.1 01/03/2017 1114   ALKPHOS 116 06/25/2023 1139   ALKPHOS 125 (H) 01/03/2017 1114   AST 11 (L) 06/25/2023 1139   ALT 6 06/25/2023 1139   ALT 22 01/03/2017 1114   BILITOT 0.5 06/25/2023 1139      Impression and Plan:  Adam Melton is a 82 year old white male. He developed a thrombus in the right lower leg. He has had prior thromboembolic disease. He was taken off blood thinner with Coumadin and then developed another thrombus.  He now has atrial fibrillation.  He will be on lifelong anticoagulation with therapeutic Xarelto.  From my point of view, I do not see any problems with him having a cardiac ablation.  We will go ahead and plan to get him back to see [place] in another 6 months.   Josph Macho, MD 12/11/20241:04 PM

## 2023-12-08 ENCOUNTER — Other Ambulatory Visit: Payer: Self-pay | Admitting: Cardiology

## 2023-12-08 DIAGNOSIS — I4819 Other persistent atrial fibrillation: Secondary | ICD-10-CM

## 2023-12-09 NOTE — Telephone Encounter (Signed)
 Prescription refill request for Xarelto  received.  Indication: Afib   Last office visit: 02/25/23 Adam Melton)  Weight: 90.3kg Age: 83 Scr: 1.06 (06/25/23)  CrCl: 69.57ml/min  Appropriate dose. Refill sent.

## 2023-12-24 ENCOUNTER — Inpatient Hospital Stay (HOSPITAL_BASED_OUTPATIENT_CLINIC_OR_DEPARTMENT_OTHER): Payer: Medicare HMO | Admitting: Hematology & Oncology

## 2023-12-24 ENCOUNTER — Inpatient Hospital Stay: Payer: Medicare HMO | Attending: Hematology & Oncology

## 2023-12-24 ENCOUNTER — Encounter: Payer: Self-pay | Admitting: Hematology & Oncology

## 2023-12-24 VITALS — BP 133/76 | HR 58 | Temp 98.3°F | Resp 20 | Ht 72.0 in | Wt 203.0 lb

## 2023-12-24 DIAGNOSIS — I4819 Other persistent atrial fibrillation: Secondary | ICD-10-CM | POA: Diagnosis not present

## 2023-12-24 DIAGNOSIS — I82461 Acute embolism and thrombosis of right calf muscular vein: Secondary | ICD-10-CM | POA: Insufficient documentation

## 2023-12-24 DIAGNOSIS — Z79899 Other long term (current) drug therapy: Secondary | ICD-10-CM | POA: Insufficient documentation

## 2023-12-24 DIAGNOSIS — Z7901 Long term (current) use of anticoagulants: Secondary | ICD-10-CM | POA: Diagnosis not present

## 2023-12-24 DIAGNOSIS — I4891 Unspecified atrial fibrillation: Secondary | ICD-10-CM | POA: Diagnosis not present

## 2023-12-24 DIAGNOSIS — I825Z1 Chronic embolism and thrombosis of unspecified deep veins of right distal lower extremity: Secondary | ICD-10-CM

## 2023-12-24 LAB — CBC WITH DIFFERENTIAL (CANCER CENTER ONLY)
Abs Immature Granulocytes: 0.03 10*3/uL (ref 0.00–0.07)
Basophils Absolute: 0.1 10*3/uL (ref 0.0–0.1)
Basophils Relative: 1 %
Eosinophils Absolute: 0.3 10*3/uL (ref 0.0–0.5)
Eosinophils Relative: 4 %
HCT: 38.6 % — ABNORMAL LOW (ref 39.0–52.0)
Hemoglobin: 13.6 g/dL (ref 13.0–17.0)
Immature Granulocytes: 0 %
Lymphocytes Relative: 14 %
Lymphs Abs: 1 10*3/uL (ref 0.7–4.0)
MCH: 35.9 pg — ABNORMAL HIGH (ref 26.0–34.0)
MCHC: 35.2 g/dL (ref 30.0–36.0)
MCV: 101.8 fL — ABNORMAL HIGH (ref 80.0–100.0)
Monocytes Absolute: 0.8 10*3/uL (ref 0.1–1.0)
Monocytes Relative: 11 %
Neutro Abs: 4.7 10*3/uL (ref 1.7–7.7)
Neutrophils Relative %: 70 %
Platelet Count: 233 10*3/uL (ref 150–400)
RBC: 3.79 MIL/uL — ABNORMAL LOW (ref 4.22–5.81)
RDW: 12.7 % (ref 11.5–15.5)
WBC Count: 6.7 10*3/uL (ref 4.0–10.5)
nRBC: 0 % (ref 0.0–0.2)

## 2023-12-24 LAB — CMP (CANCER CENTER ONLY)
ALT: 5 U/L (ref 0–44)
AST: 11 U/L — ABNORMAL LOW (ref 15–41)
Albumin: 4.2 g/dL (ref 3.5–5.0)
Alkaline Phosphatase: 120 U/L (ref 38–126)
Anion gap: 8 (ref 5–15)
BUN: 19 mg/dL (ref 8–23)
CO2: 27 mmol/L (ref 22–32)
Calcium: 8.6 mg/dL — ABNORMAL LOW (ref 8.9–10.3)
Chloride: 105 mmol/L (ref 98–111)
Creatinine: 1.15 mg/dL (ref 0.61–1.24)
GFR, Estimated: >60 mL/min (ref >60)
Glucose, Bld: 89 mg/dL (ref 70–99)
Potassium: 4.5 mmol/L (ref 3.5–5.1)
Sodium: 140 mmol/L (ref 135–145)
Total Bilirubin: 0.6 mg/dL (ref 0.0–1.2)
Total Protein: 6.3 g/dL — ABNORMAL LOW (ref 6.5–8.1)

## 2023-12-24 NOTE — Progress Notes (Signed)
 Hematology and Oncology Follow Up Visit  Adam Melton [id number] 1941/07/15 83 y.o. 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  He seems to be doing pretty well.  He did have the cardiac ablation for the atrial fibrillation.  He is doing quite well with this.  He is on amiodarone.  He is on Entresto .  He has little bit of swelling in his legs.  He is on the Xarelto .  He is on this lifelong.  He has had no bleeding.  There is been no change in bowel or bladder habits.  He has had no cough.  He has had no chest wall pain.  There has been no shortness of breath.  He has had no fever.  Overall, I will say that his performance status is probably ECOG 1.   Medications:  Current Outpatient Medications:    amiodarone (PACERONE) 200 MG tablet, Take 100 mg by mouth 3 (three) times a week. Takes Mon, Wed, and Friday., Disp: , Rfl:    Ascorbic Acid (VITAMIN C PO), Take 1 tablet by mouth daily., Disp: , Rfl:    busPIRone  (BUSPAR ) 5 MG tablet, Take 1 tablet (5 mg total) by mouth daily. (Patient taking differently: Take 5 mg by mouth 2 (two) times daily.), Disp: 30 tablet, Rfl: 0   carvedilol (COREG) 3.125 MG tablet, Take by mouth daily., Disp: , Rfl:    Cholecalciferol (VITAMIN D PO), Take 1,000 Int'l Units/day by mouth daily., Disp: , Rfl:    Cyanocobalamin  (B-12 PO), Take 1 tablet by mouth daily., Disp: , Rfl:    DILANTIN  100 MG ER capsule, TAKE TWO CAPSULES BY MOUTH EVERY MORNING AND TAKE THREE CAPSULES AT BEDTIME (Patient taking differently: Take 200 mg by mouth 2 (two) times daily. 12/24/2021 Takes 2 capsules BID.), Disp: 450 capsule, Rfl: 0   latanoprost (XALATAN) 0.005 % ophthalmic solution, Place 1 drop into both eyes at bedtime., Disp: , Rfl: 5   levothyroxine (SYNTHROID) 50 MCG tablet, Take 50 mcg by mouth daily.,  Disp: , Rfl:    rivaroxaban  (XARELTO ) 20 MG TABS tablet, Take 1 tablet (20 mg total) by mouth daily with supper., Disp: 90 tablet, Rfl: 0   sacubitril-valsartan (ENTRESTO ) 24-26 MG, Take 1 tablet by mouth 2 (two) times daily. (Patient taking differently: Take 1 tablet by mouth daily.), Disp: 180 tablet, Rfl: 1  Allergies:  Allergies  Allergen Reactions   Azithromycin Other (See Comments)    seizure   Penicillins Nausea And Vomiting and Rash   Shellfish Allergy Swelling    Past Medical History, Surgical history, Social history, and Family History were reviewed and updated.  Review of Systems: Review of Systems  Constitutional: Negative.   HENT: Negative.    Eyes: Negative.   Respiratory: Negative.    Cardiovascular: Negative.   Gastrointestinal: Negative.   Genitourinary: Negative.   Musculoskeletal: Negative.   Skin: Negative.   Neurological: Negative.   Endo/Heme/Allergies: Negative.   Psychiatric/Behavioral: Negative.       Physical Exam:  height is 6' (1.829 m) and weight is 203 lb (92.1 kg). His oral temperature is 98.3 F (36.8 C). His blood pressure is 133/76 and his pulse is 58 (abnormal). His respiration is 20 and oxygen saturation is 97%.   Wt Readings from Last 3 Encounters:  12/24/23 203 lb (92.1 kg)  06/25/23 199 lb (90.3 kg)  02/25/23 204 lb (92.5 kg)     Physical Exam Vitals reviewed.  HENT:     Head: Normocephalic and atraumatic.  Eyes:     Pupils: Pupils are equal, round, and reactive to light.  Cardiovascular:     Rate and Rhythm: Normal rate.     Heart sounds: Normal heart sounds.     Comments: Cardiac exam seems to be irregular.  The rate is okay.  He has no obvious murmurs. Pulmonary:     Effort: Pulmonary effort is normal.     Breath sounds: Normal breath sounds.  Abdominal:     General: Bowel sounds are normal.     Palpations: Abdomen is soft.  Musculoskeletal:        General: No tenderness or deformity. Normal range of motion.      Cervical back: Normal range of motion.  Lymphadenopathy:     Cervical: No cervical adenopathy.  Skin:    General: Skin is warm and dry.     Findings: No erythema or rash.  Neurological:     Mental Status: He is alert and oriented to person, place, and time.  Psychiatric:        Behavior: Behavior normal.        Thought Content: Thought content normal.        Judgment: Judgment normal.   Is Lab Results  Component Value Date   WBC 6.7 12/24/2023   HGB 13.6 12/24/2023   HCT 38.6 (L) 12/24/2023   MCV 101.8 (H) 12/24/2023   PLT 233 12/24/2023     Chemistry      Component Value Date/Time   NA 140 06/25/2023 1139   NA 142 03/07/2023 0835   NA 144 01/03/2017 1114   K 5.4 (H) 06/25/2023 1139   K 4.5 01/03/2017 1114   CL 106 06/25/2023 1139   CL 108 01/03/2017 1114   CO2 28 06/25/2023 1139   CO2 32 01/03/2017 1114   BUN 21 06/25/2023 1139   BUN 19 03/07/2023 0835   BUN 13 01/03/2017 1114   CREATININE 1.06 06/25/2023 1139   CREATININE 1.1 01/03/2017 1114      Component Value Date/Time   CALCIUM  9.0 06/25/2023 1139   CALCIUM  9.1 01/03/2017 1114   ALKPHOS 116 06/25/2023 1139   ALKPHOS 125 (H) 01/03/2017 1114   AST 11 (L) 06/25/2023 1139   ALT 6 06/25/2023 1139   ALT 22 01/03/2017 1114   BILITOT 0.5 06/25/2023 1139      Impression and Plan:  Adam Melton is a 83 year old white male. He developed a thrombus in the right lower leg. He has had prior thromboembolic disease. He was taken off blood thinner with Coumadin and then developed another thrombus.  He now has atrial fibrillation.  He will be on lifelong anticoagulation with therapeutic Xarelto .  That sounds like an looks like his atrial fibrillation is under very good control.  I know that he sees cardiology for this.  For right now, we will still plan to get him back every 6 months.  I think this is a good follow-up interval.    Ivor Mars, MD 6/11/202512:33 PM

## 2024-06-24 ENCOUNTER — Ambulatory Visit: Admitting: Hematology & Oncology

## 2024-06-24 ENCOUNTER — Inpatient Hospital Stay: Attending: Hematology & Oncology

## 2024-06-24 ENCOUNTER — Encounter: Payer: Self-pay | Admitting: Hematology & Oncology

## 2024-06-24 VITALS — BP 127/80 | HR 59 | Temp 98.0°F | Resp 20 | Ht 72.0 in | Wt 207.4 lb

## 2024-06-24 DIAGNOSIS — I825Z1 Chronic embolism and thrombosis of unspecified deep veins of right distal lower extremity: Secondary | ICD-10-CM | POA: Diagnosis not present

## 2024-06-24 DIAGNOSIS — I4891 Unspecified atrial fibrillation: Secondary | ICD-10-CM | POA: Diagnosis not present

## 2024-06-24 DIAGNOSIS — Z7901 Long term (current) use of anticoagulants: Secondary | ICD-10-CM | POA: Diagnosis not present

## 2024-06-24 DIAGNOSIS — I82461 Acute embolism and thrombosis of right calf muscular vein: Secondary | ICD-10-CM | POA: Diagnosis present

## 2024-06-24 DIAGNOSIS — I4819 Other persistent atrial fibrillation: Secondary | ICD-10-CM

## 2024-06-24 LAB — CBC WITH DIFFERENTIAL (CANCER CENTER ONLY)
Abs Immature Granulocytes: 0.04 K/uL (ref 0.00–0.07)
Basophils Absolute: 0 K/uL (ref 0.0–0.1)
Basophils Relative: 1 %
Eosinophils Absolute: 0.3 K/uL (ref 0.0–0.5)
Eosinophils Relative: 5 %
HCT: 40.1 % (ref 39.0–52.0)
Hemoglobin: 13.5 g/dL (ref 13.0–17.0)
Immature Granulocytes: 1 %
Lymphocytes Relative: 16 %
Lymphs Abs: 0.9 K/uL (ref 0.7–4.0)
MCH: 33.7 pg (ref 26.0–34.0)
MCHC: 33.7 g/dL (ref 30.0–36.0)
MCV: 100 fL (ref 80.0–100.0)
Monocytes Absolute: 0.7 K/uL (ref 0.1–1.0)
Monocytes Relative: 13 %
Neutro Abs: 3.8 K/uL (ref 1.7–7.7)
Neutrophils Relative %: 64 %
Platelet Count: 190 K/uL (ref 150–400)
RBC: 4.01 MIL/uL — ABNORMAL LOW (ref 4.22–5.81)
RDW: 12.8 % (ref 11.5–15.5)
WBC Count: 5.8 K/uL (ref 4.0–10.5)
nRBC: 0 % (ref 0.0–0.2)

## 2024-06-24 LAB — CMP (CANCER CENTER ONLY)
ALT: 7 U/L (ref 0–44)
AST: 18 U/L (ref 15–41)
Albumin: 4 g/dL (ref 3.5–5.0)
Alkaline Phosphatase: 152 U/L — ABNORMAL HIGH (ref 38–126)
Anion gap: 10 (ref 5–15)
BUN: 17 mg/dL (ref 8–23)
CO2: 24 mmol/L (ref 22–32)
Calcium: 8.6 mg/dL — ABNORMAL LOW (ref 8.9–10.3)
Chloride: 104 mmol/L (ref 98–111)
Creatinine: 0.85 mg/dL (ref 0.61–1.24)
GFR, Estimated: >60 mL/min (ref >60)
Glucose, Bld: 91 mg/dL (ref 70–99)
Potassium: 4.8 mmol/L (ref 3.5–5.1)
Sodium: 138 mmol/L (ref 135–145)
Total Bilirubin: 0.3 mg/dL (ref 0.0–1.2)
Total Protein: 6.3 g/dL — ABNORMAL LOW (ref 6.5–8.1)

## 2024-06-24 NOTE — Progress Notes (Signed)
 Hematology and Oncology Follow Up Visit  Adam Melton [id number] [birth year] 83 y.o. 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  We last saw him back in June.  So far, everything is doing okay.  I think the cardiac ablation for his atrial fibrillation seems to help quite a bit.  He still has a little bit of swelling, mostly in the left lower leg.  I am sure this is probably from medications.  He has had no bleeding.  There is been no change in bowel or bladder habits.  He has had no issues with mouth sores.  He continues on Xarelto .  I think he is doing well on the Xarelto .  He has had no headache.  He has had a little bit of dizziness.  Again, I suspect this might be secondary to his heart rate being on the lower side.  I am sure that his cardiologist is watching this.  Overall, I would say that his performance status is probably ECOG 1.    Medications:  Current Outpatient Medications:    Ascorbic Acid (VITAMIN C PO), Take 1 tablet by mouth daily., Disp: , Rfl:    busPIRone  (BUSPAR ) 5 MG tablet, Take 1 tablet (5 mg total) by mouth daily. (Patient taking differently: Take 5 mg by mouth 2 (two) times daily.), Disp: 30 tablet, Rfl: 0   carvedilol (COREG) 3.125 MG tablet, Take by mouth daily., Disp: , Rfl:    Cholecalciferol (VITAMIN D PO), Take 1,000 Int'l Units/day by mouth daily., Disp: , Rfl:    Cyanocobalamin  (B-12 PO), Take 1 tablet by mouth daily., Disp: , Rfl:    DILANTIN  100 MG ER capsule, TAKE TWO CAPSULES BY MOUTH EVERY MORNING AND TAKE THREE CAPSULES AT BEDTIME (Patient taking differently: Take 200 mg by mouth 2 (two) times daily. 12/24/2021 Takes 2 capsules BID.), Disp: 450 capsule, Rfl: 0   latanoprost (XALATAN) 0.005 % ophthalmic solution, Place 1 drop into both eyes at bedtime., Disp: , Rfl: 5    levothyroxine (SYNTHROID) 50 MCG tablet, Take 50 mcg by mouth daily., Disp: , Rfl:    rivaroxaban  (XARELTO ) 20 MG TABS tablet, Take 1 tablet (20 mg total) by mouth daily with supper., Disp: 90 tablet, Rfl: 0   sacubitril-valsartan (ENTRESTO ) 24-26 MG, Take 1 tablet by mouth 2 (two) times daily. (Patient taking differently: Take 1 tablet by mouth daily.), Disp: 180 tablet, Rfl: 1  Allergies:  Allergies  Allergen Reactions   Azithromycin Other (See Comments)    seizure   Penicillins Nausea And Vomiting and Rash   Shellfish Allergy Swelling    Past Medical History, Surgical history, Social history, and Family History were reviewed and updated.  Review of Systems: Review of Systems  Constitutional: Negative.   HENT: Negative.    Eyes: Negative.   Respiratory: Negative.    Cardiovascular: Negative.   Gastrointestinal: Negative.   Genitourinary: Negative.   Musculoskeletal: Negative.   Skin: Negative.   Neurological: Negative.   Endo/Heme/Allergies: Negative.   Psychiatric/Behavioral: Negative.       Physical Exam:  height is 6' (1.829 m) and weight is 207 lb 6.4 oz (94.1 kg). His oral temperature is 98 F (36.7 C). His blood pressure is 127/80 and his pulse is 59 (abnormal). His respiration is 20 and oxygen saturation is 99%.   Wt Readings  from Last 3 Encounters:  06/24/24 207 lb 6.4 oz (94.1 kg)  12/24/23 203 lb (92.1 kg)  06/25/23 199 lb (90.3 kg)     Physical Exam Vitals reviewed.  HENT:     Head: Normocephalic and atraumatic.  Eyes:     Pupils: Pupils are equal, round, and reactive to light.  Cardiovascular:     Rate and Rhythm: Normal rate.     Heart sounds: Normal heart sounds.     Comments: Cardiac exam seems to be regular rate and rhythm.  He seems to have an occasional extra beat.   Pulmonary:     Effort: Pulmonary effort is normal.     Breath sounds: Normal breath sounds.  Abdominal:     General: Bowel sounds are normal.     Palpations: Abdomen is soft.   Musculoskeletal:        General: No tenderness or deformity. Normal range of motion.     Cervical back: Normal range of motion.  Lymphadenopathy:     Cervical: No cervical adenopathy.  Skin:    General: Skin is warm and dry.     Findings: No erythema or rash.  Neurological:     Mental Status: He is alert and oriented to person, place, and time.  Psychiatric:        Behavior: Behavior normal.        Thought Content: Thought content normal.        Judgment: Judgment normal.   Is Lab Results  Component Value Date   WBC 5.8 06/24/2024   HGB 13.5 06/24/2024   HCT 40.1 06/24/2024   MCV 100.0 06/24/2024   PLT 190 06/24/2024     Chemistry      Component Value Date/Time   NA 138 06/24/2024 1148   NA 142 03/07/2023 0835   NA 144 01/03/2017 1114   K 4.8 06/24/2024 1148   K 4.5 01/03/2017 1114   CL 104 06/24/2024 1148   CL 108 01/03/2017 1114   CO2 24 06/24/2024 1148   CO2 32 01/03/2017 1114   BUN 17 06/24/2024 1148   BUN 19 03/07/2023 0835   BUN 13 01/03/2017 1114   CREATININE 0.85 06/24/2024 1148   CREATININE 1.1 01/03/2017 1114      Component Value Date/Time   CALCIUM  8.6 (L) 06/24/2024 1148   CALCIUM  9.1 01/03/2017 1114   ALKPHOS 152 (H) 06/24/2024 1148   ALKPHOS 125 (H) 01/03/2017 1114   AST 18 06/24/2024 1148   ALT 7 06/24/2024 1148   ALT 22 01/03/2017 1114   BILITOT 0.3 06/24/2024 1148      Impression and Plan:  Adam Melton is a 83 year old white male. He developed a thrombus in the right lower leg. He has had prior thromboembolic disease. He was taken off blood thinner with Coumadin and then developed another thrombus.  From my point of view, everything's looks pretty good.  I am glad that the atrial fibrillation seems to be under very good control.  We will still plan to get him back in 6 months.  Hopefully, if everything looks pretty stable, we may build to move his appointments out to once a year.   Maude JONELLE Crease, MD 12/11/202512:39 PM

## 2024-12-23 ENCOUNTER — Inpatient Hospital Stay: Admitting: Hematology & Oncology

## 2024-12-23 ENCOUNTER — Inpatient Hospital Stay
